# Patient Record
Sex: Female | Born: 1945
Health system: Southern US, Community
[De-identification: ages and names within clinical notes are randomized; demographics above are authoritative.]

## PROBLEM LIST (undated history)

## (undated) DIAGNOSIS — IMO0002 Reserved for concepts with insufficient information to code with codable children: Secondary | ICD-10-CM

## (undated) DIAGNOSIS — O24419 Gestational diabetes mellitus in pregnancy, unspecified control: Secondary | ICD-10-CM

## (undated) DIAGNOSIS — N393 Stress incontinence (female) (male): Secondary | ICD-10-CM

## (undated) DIAGNOSIS — N390 Urinary tract infection, site not specified: Secondary | ICD-10-CM

## (undated) DIAGNOSIS — R0989 Other specified symptoms and signs involving the circulatory and respiratory systems: Secondary | ICD-10-CM

## (undated) DIAGNOSIS — K5790 Diverticulosis of intestine, part unspecified, without perforation or abscess without bleeding: Secondary | ICD-10-CM

## (undated) DIAGNOSIS — H409 Unspecified glaucoma: Secondary | ICD-10-CM

## (undated) DIAGNOSIS — M1990 Unspecified osteoarthritis, unspecified site: Secondary | ICD-10-CM

## (undated) DIAGNOSIS — F32A Depression, unspecified: Secondary | ICD-10-CM

## (undated) DIAGNOSIS — N812 Incomplete uterovaginal prolapse: Secondary | ICD-10-CM

## (undated) DIAGNOSIS — R112 Nausea with vomiting, unspecified: Secondary | ICD-10-CM

## (undated) DIAGNOSIS — N281 Cyst of kidney, acquired: Secondary | ICD-10-CM

## (undated) DIAGNOSIS — N3020 Other chronic cystitis without hematuria: Secondary | ICD-10-CM

## (undated) DIAGNOSIS — F329 Major depressive disorder, single episode, unspecified: Secondary | ICD-10-CM

## (undated) DIAGNOSIS — R9341 Abnormal radiologic findings on diagnostic imaging of renal pelvis, ureter, or bladder: Secondary | ICD-10-CM

## (undated) DIAGNOSIS — Z9889 Other specified postprocedural states: Secondary | ICD-10-CM

## (undated) DIAGNOSIS — E785 Hyperlipidemia, unspecified: Secondary | ICD-10-CM

## (undated) DIAGNOSIS — C4490 Unspecified malignant neoplasm of skin, unspecified: Secondary | ICD-10-CM

## (undated) DIAGNOSIS — F419 Anxiety disorder, unspecified: Secondary | ICD-10-CM

## (undated) DIAGNOSIS — R339 Retention of urine, unspecified: Secondary | ICD-10-CM

## (undated) HISTORY — PX: PUBOVAGINAL SLING: SHX1035

## (undated) HISTORY — DX: Unspecified malignant neoplasm of skin, unspecified: C44.90

## (undated) HISTORY — DX: Depression, unspecified: F32.A

## (undated) HISTORY — PX: OTHER SURGICAL HISTORY: SHX169

## (undated) HISTORY — DX: Cyst of kidney, acquired: N28.1

## (undated) HISTORY — DX: Hyperlipidemia, unspecified: E78.5

## (undated) HISTORY — PX: ABDOMINAL HYSTERECTOMY: SHX81

## (undated) HISTORY — PX: TONSILLECTOMY: SUR1361

## (undated) HISTORY — DX: Retention of urine, unspecified: R33.9

## (undated) HISTORY — DX: Urinary tract infection, site not specified: N39.0

## (undated) HISTORY — PX: BREAST LUMPECTOMY: SHX2

## (undated) HISTORY — DX: Other specified symptoms and signs involving the circulatory and respiratory systems: R09.89

## (undated) HISTORY — PX: VEIN LIGATION AND STRIPPING: SHX2653

## (undated) HISTORY — PX: APPENDECTOMY: SHX54

## (undated) HISTORY — DX: Diverticulosis of intestine, part unspecified, without perforation or abscess without bleeding: K57.90

## (undated) HISTORY — DX: Gestational diabetes mellitus in pregnancy, unspecified control: O24.419

## (undated) HISTORY — DX: Stress incontinence (female) (male): N39.3

## (undated) HISTORY — DX: Reserved for concepts with insufficient information to code with codable children: IMO0002

## (undated) HISTORY — DX: Other chronic cystitis without hematuria: N30.20

## (undated) HISTORY — PX: BREAST BIOPSY: SHX20

## (undated) HISTORY — DX: Incomplete uterovaginal prolapse: N81.2

## (undated) HISTORY — PX: ANTERIOR AND POSTERIOR VAGINAL REPAIR: SUR5

## (undated) HISTORY — DX: Anxiety disorder, unspecified: F41.9

## (undated) HISTORY — DX: Unspecified glaucoma: H40.9

## (undated) HISTORY — DX: Unspecified osteoarthritis, unspecified site: M19.90

## (undated) HISTORY — DX: Major depressive disorder, single episode, unspecified: F32.9

---

## 2000-02-20 ENCOUNTER — Other Ambulatory Visit: Admission: RE | Admit: 2000-02-20 | Discharge: 2000-02-20 | Payer: Self-pay | Admitting: Obstetrics and Gynecology

## 2000-03-13 ENCOUNTER — Encounter: Admission: RE | Admit: 2000-03-13 | Discharge: 2000-06-11 | Payer: Self-pay | Admitting: Obstetrics and Gynecology

## 2003-01-21 ENCOUNTER — Encounter: Payer: Self-pay | Admitting: *Deleted

## 2003-01-21 ENCOUNTER — Encounter: Admission: RE | Admit: 2003-01-21 | Discharge: 2003-01-21 | Payer: Self-pay | Admitting: *Deleted

## 2007-07-31 ENCOUNTER — Ambulatory Visit: Payer: Self-pay | Admitting: Internal Medicine

## 2007-11-16 ENCOUNTER — Ambulatory Visit: Payer: Self-pay | Admitting: Internal Medicine

## 2007-12-20 ENCOUNTER — Ambulatory Visit: Payer: Self-pay | Admitting: Family Medicine

## 2008-06-24 ENCOUNTER — Ambulatory Visit: Payer: Self-pay | Admitting: Family Medicine

## 2008-09-13 ENCOUNTER — Ambulatory Visit: Payer: Self-pay | Admitting: Internal Medicine

## 2008-12-26 ENCOUNTER — Ambulatory Visit: Payer: Self-pay | Admitting: Family Medicine

## 2009-06-10 ENCOUNTER — Ambulatory Visit: Payer: Self-pay | Admitting: Internal Medicine

## 2009-08-25 ENCOUNTER — Ambulatory Visit: Payer: Self-pay | Admitting: Unknown Physician Specialty

## 2009-08-30 ENCOUNTER — Ambulatory Visit: Payer: Self-pay | Admitting: Urology

## 2009-09-27 ENCOUNTER — Inpatient Hospital Stay: Payer: Self-pay | Admitting: Urology

## 2009-11-23 ENCOUNTER — Ambulatory Visit: Payer: Self-pay | Admitting: Family Medicine

## 2010-09-29 ENCOUNTER — Ambulatory Visit: Payer: Self-pay | Admitting: Internal Medicine

## 2011-01-11 ENCOUNTER — Encounter: Payer: Self-pay | Admitting: Internal Medicine

## 2011-01-24 ENCOUNTER — Encounter: Payer: Self-pay | Admitting: Internal Medicine

## 2011-02-22 ENCOUNTER — Encounter: Payer: Self-pay | Admitting: Internal Medicine

## 2011-03-25 ENCOUNTER — Encounter: Payer: Self-pay | Admitting: Internal Medicine

## 2011-09-04 ENCOUNTER — Other Ambulatory Visit: Payer: Self-pay | Admitting: Internal Medicine

## 2011-10-03 LAB — HM MAMMOGRAPHY: HM Mammogram: NORMAL

## 2011-10-08 ENCOUNTER — Other Ambulatory Visit: Payer: Self-pay | Admitting: Internal Medicine

## 2011-10-17 ENCOUNTER — Ambulatory Visit: Payer: Self-pay | Admitting: Internal Medicine

## 2011-10-19 ENCOUNTER — Encounter: Payer: Self-pay | Admitting: Internal Medicine

## 2011-10-19 ENCOUNTER — Telehealth: Payer: Self-pay | Admitting: Internal Medicine

## 2011-10-19 NOTE — Telephone Encounter (Signed)
Mammogram was normal.   

## 2011-10-19 NOTE — Telephone Encounter (Signed)
Sent a letter to patient letting her know her mammogram was normal because her phone had been disconnected. Thanks pam

## 2011-10-20 ENCOUNTER — Other Ambulatory Visit: Payer: Self-pay | Admitting: Internal Medicine

## 2011-10-24 ENCOUNTER — Ambulatory Visit (INDEPENDENT_AMBULATORY_CARE_PROVIDER_SITE_OTHER): Payer: Medicare Other | Admitting: Internal Medicine

## 2011-10-24 ENCOUNTER — Encounter: Payer: Self-pay | Admitting: Internal Medicine

## 2011-10-24 VITALS — BP 124/70 | HR 68 | Temp 98.2°F | Resp 12 | Ht 62.0 in | Wt 158.2 lb

## 2011-10-24 DIAGNOSIS — E785 Hyperlipidemia, unspecified: Secondary | ICD-10-CM

## 2011-10-24 DIAGNOSIS — G4700 Insomnia, unspecified: Secondary | ICD-10-CM | POA: Insufficient documentation

## 2011-10-24 DIAGNOSIS — F411 Generalized anxiety disorder: Secondary | ICD-10-CM | POA: Insufficient documentation

## 2011-10-24 DIAGNOSIS — H6692 Otitis media, unspecified, left ear: Secondary | ICD-10-CM

## 2011-10-24 DIAGNOSIS — F419 Anxiety disorder, unspecified: Secondary | ICD-10-CM

## 2011-10-24 DIAGNOSIS — N959 Unspecified menopausal and perimenopausal disorder: Secondary | ICD-10-CM

## 2011-10-24 DIAGNOSIS — H6690 Otitis media, unspecified, unspecified ear: Secondary | ICD-10-CM

## 2011-10-24 MED ORDER — TRAZODONE HCL 50 MG PO TABS: 50.0000 mg | ORAL_TABLET | Freq: Every day | ORAL | Status: DC

## 2011-10-24 MED ORDER — ESTRADIOL 1 MG PO TABS: 0.5000 mg | ORAL_TABLET | Freq: Every day | ORAL | Status: DC

## 2011-10-24 MED ORDER — AZITHROMYCIN 250 MG PO TABS: ORAL_TABLET | ORAL | Status: AC

## 2011-10-24 NOTE — Progress Notes (Signed)
Subjective:    Patient ID: Michele Meyer, female    DOB: [birth year], 65 y.o.   MRN: [id number]  HPI 65 year old female with a history of anxiety, hypertension, arthritis pain presents for a followup visit. She has several concerns today. First, she notes bilateral ear pain worse on the left than the right which has been persistent over the last 2 months. It has been associated with some nasal drainage and sinus pressure. She denies any fever or chills. She notes decreased hearing on the left. Next  She is also concerned about persistent insomnia. She has been taking alprazolam and can typically sleep for 4 hours but then wakes and is unable to get back to sleep. Her insomnia has been persistent for years. She has not tried other medications for this. She is interested in trying other medications at this time.  She reports that her anxiety is significantly improved recently. She has set some boundaries with her son. She has devoted more time to her marriage.  She is interested in reducing her dose of estrogen supplementation given the potential risk of breast cancer. She tried stopping her Estrace in the past with hot flashes and then resumed its use.  Outpatient Encounter Prescriptions as of 10/24/2011  Medication Sig Dispense Refill  . ALPRAZolam (XANAX) 0.5 MG tablet Take 0.5 mg by mouth at bedtime as needed.        Marland Kitchen BYSTOLIC 5 MG tablet TAKE 1 TABLET BY MOUTH EVERYDAY  30 tablet  0  . escitalopram (LEXAPRO) 20 MG tablet Take 20 mg by mouth daily.        Marland Kitchen estradiol (ESTRACE) 1 MG tablet Take 0.5 tablets (0.5 mg total) by mouth daily.  30 tablet  1  . lisinopril-hydrochlorothiazide (PRINZIDE,ZESTORETIC) 10-12.5 MG per tablet Take 1 tablet by mouth daily.        . MELOXICAM PO Take 1 tablet by mouth as needed.        . vitamin E 400 UNIT capsule Take 400 Units by mouth daily.          Review of Systems  Constitutional: Negative for fever, chills, appetite change, fatigue and unexpected  weight change.  HENT: Negative for ear pain, congestion, sore throat, trouble swallowing, neck pain, voice change and sinus pressure.   Eyes: Negative for visual disturbance.  Respiratory: Negative for cough, shortness of breath, wheezing and stridor.   Cardiovascular: Negative for chest pain, palpitations and leg swelling.  Gastrointestinal: Negative for nausea, vomiting, abdominal pain, diarrhea, constipation, blood in stool, abdominal distention and anal bleeding.  Genitourinary: Negative for dysuria and flank pain.  Musculoskeletal: Negative for myalgias, arthralgias and gait problem.  Skin: Negative for color change and rash.  Neurological: Negative for dizziness and headaches.  Hematological: Negative for adenopathy. Does not bruise/bleed easily.  Psychiatric/Behavioral: Negative for suicidal ideas, sleep disturbance and dysphoric mood. The patient is not nervous/anxious.    BP 124/70  Pulse 68  Temp(Src) 98.2 F (36.8 C) (Oral)  Resp 12  Ht 5\' 2"  (1.575 m)  Wt 158 lb 4 oz (71.782 kg)  BMI 28.94 kg/m2  SpO2 99%     Objective:   Physical Exam  Constitutional: She is oriented to person, place, and time. She appears well-developed and well-nourished. No distress.  HENT:  Head: Normocephalic and atraumatic.  Right Ear: Tympanic membrane, external ear and ear canal normal.  Left Ear: External ear and ear canal normal. Tympanic membrane is injected. A middle ear effusion is present.  Nose: Nose normal.  Mouth/Throat: Oropharynx is clear and moist. No oropharyngeal exudate.  Eyes: Conjunctivae are normal. Pupils are equal, round, and reactive to light. Right eye exhibits no discharge. Left eye exhibits no discharge. No scleral icterus.  Neck: Normal range of motion. Neck supple. No tracheal deviation present. No thyromegaly present.  Cardiovascular: Normal rate, regular rhythm, normal heart sounds and intact distal pulses.  Exam reveals no gallop and no friction rub.   No murmur  heard. Pulmonary/Chest: Effort normal and breath sounds normal. No respiratory distress. She has no wheezes. She has no rales. She exhibits no tenderness.  Musculoskeletal: Normal range of motion. She exhibits no edema and no tenderness.  Lymphadenopathy:    She has no cervical adenopathy.  Neurological: She is alert and oriented to person, place, and time. No cranial nerve deficit. She exhibits normal muscle tone. Coordination normal.  Skin: Skin is warm and dry. No rash noted. She is not diaphoretic. No erythema. No pallor.  Psychiatric: She has a normal mood and affect. Her behavior is normal. Judgment and thought content normal.          Assessment & Plan:  1. Otitis media left ear - symptoms and exam are consistent with otitis media of the left ear. Will treat with azithromycin. She will call or return to clinic if symptoms are not improving. She will followup in one month.  2. Insomnia -insomnia is improved with Xanax but she is only getting approximately 4 hours of sleep per night. We'll try adding trazodone 50 mg at bedtime. After 2 or 3 days if she has no improvement she will increase dose to 100 mg. She will followup in one month to assess progress.  3. Anxiety -anxiety is markedly improved after patient set boundaries with her son. Encouraged her to continue taking time for herself and her marriage. She will continue her current medications including Lexapro and Xanax. She will followup in one month.  4. Hot flashes -patient takes Estrace to help with hot flashes after menopause. Given her concerns about the potential side effects of Estrace, we'll try decreasing the dose to 0.5 mg daily. She will use this for one month then we will decrease to every other day. She will followup in one month.  5. Health Maintenance - Will schedule wellness visit. Will plan for Flu shot at next visit.

## 2011-10-24 NOTE — Patient Instructions (Addendum)
Labs tomorrow. Start z-pack today. Call if symptoms not improving by Friday. Start Trazodone today. If no improvement in sleep, then advance to 100mg  at bedtime after 2-3 days.

## 2011-10-25 ENCOUNTER — Other Ambulatory Visit: Payer: Self-pay | Admitting: Internal Medicine

## 2011-10-25 ENCOUNTER — Other Ambulatory Visit (INDEPENDENT_AMBULATORY_CARE_PROVIDER_SITE_OTHER): Payer: Medicare Other | Admitting: *Deleted

## 2011-10-25 ENCOUNTER — Encounter: Payer: Self-pay | Admitting: Internal Medicine

## 2011-10-25 DIAGNOSIS — Z0000 Encounter for general adult medical examination without abnormal findings: Secondary | ICD-10-CM

## 2011-10-25 DIAGNOSIS — E785 Hyperlipidemia, unspecified: Secondary | ICD-10-CM

## 2011-10-25 LAB — CBC WITH DIFFERENTIAL/PLATELET
Basophils Absolute: 0 10*3/uL (ref 0.0–0.1)
Eosinophils Absolute: 0.5 10*3/uL (ref 0.0–0.7)
Hemoglobin: 12.9 g/dL (ref 12.0–15.0)
Lymphocytes Relative: 24.6 % (ref 12.0–46.0)
Lymphs Abs: 1.6 10*3/uL (ref 0.7–4.0)
MCHC: 33.9 g/dL (ref 30.0–36.0)
Neutro Abs: 3.9 10*3/uL (ref 1.4–7.7)
Platelets: 221 10*3/uL (ref 150.0–400.0)
RDW: 13.1 % (ref 11.5–14.6)

## 2011-10-25 LAB — COMPREHENSIVE METABOLIC PANEL
BUN: 15 mg/dL (ref 6–23)
CO2: 29 mEq/L (ref 19–32)
Creatinine, Ser: 0.7 mg/dL (ref 0.4–1.2)
GFR: 85.89 mL/min (ref >60.00)
Glucose, Bld: 97 mg/dL (ref 70–99)
Sodium: 139 mEq/L (ref 135–145)
Total Bilirubin: 0.5 mg/dL (ref 0.3–1.2)
Total Protein: 7.3 g/dL (ref 6.0–8.3)

## 2011-10-25 LAB — LIPID PANEL
Cholesterol: 230 mg/dL — ABNORMAL HIGH (ref 0–200)
Triglycerides: 126 mg/dL (ref 0.0–149.0)
VLDL: 25.2 mg/dL (ref 0.0–40.0)

## 2011-11-05 ENCOUNTER — Ambulatory Visit (INDEPENDENT_AMBULATORY_CARE_PROVIDER_SITE_OTHER): Payer: Medicare Other | Admitting: Internal Medicine

## 2011-11-05 ENCOUNTER — Encounter: Payer: Self-pay | Admitting: Internal Medicine

## 2011-11-05 VITALS — BP 130/64 | HR 61 | Temp 98.4°F | Ht 65.0 in | Wt 158.0 lb

## 2011-11-05 DIAGNOSIS — F419 Anxiety disorder, unspecified: Secondary | ICD-10-CM

## 2011-11-05 DIAGNOSIS — E785 Hyperlipidemia, unspecified: Secondary | ICD-10-CM

## 2011-11-05 DIAGNOSIS — F411 Generalized anxiety disorder: Secondary | ICD-10-CM

## 2011-11-05 DIAGNOSIS — Z0000 Encounter for general adult medical examination without abnormal findings: Secondary | ICD-10-CM

## 2011-11-05 DIAGNOSIS — R079 Chest pain, unspecified: Secondary | ICD-10-CM

## 2011-11-05 NOTE — Patient Instructions (Signed)
Chest xray tomorrow. Follow up in 42month.

## 2011-11-05 NOTE — Progress Notes (Signed)
Subjective:    Patient ID: Michele Meyer, female    DOB: [birth year], 65 y.o.   MRN: [id number]  HPI  The patient is here for annual Medicare wellness examination and management of other chronic and acute problems.   The risk factors are reflected in the social history.  The roster of all physicians providing medical care to patient - is listed in the Snapshot section of the chart.  Activities of daily living:  The patient is 100% inedpendent in all ADLs: dressing, toileting, feeding as well as independent mobility  Home safety : The patient has smoke detectors in the home. They wear seatbelts. Firearms are present in the home, kept in a safe fashion. There is no violence in the home.   There is no risks for hepatitis, STDs or HIV. There is no history of blood transfusion. They have no travel history to infectious disease endemic areas of the world.  The patient has seen their dentist in the last six months. They have seen their eye doctor in the last year. They deny hearing difficulty and have not had audiologic testing in the last year.  They do not have excessive sun exposure. Discussed the need for sun protection: hats, long sleeves and use of sunscreen if there is significant sun exposure. Pt was seen by dermatologist September 2012.  Diet: the importance of a healthy diet is discussed. They do have an unhealthy diet with intake of high-fat, high-sugar foods recently.  The patient has a regular exercise program: not at this time, was swimming daily. The benefits of regular aerobic exercise were discussed.  Depression screen: pt is tearful and having difficulty sleeping because of ongoing anxiety/depression related to issues with her son.  Cognitive assessment: the patient manages all their financial and personal affairs and is actively engaged. They could relate day,date,year and events; recalled 3/3 objects at 3 minutes; performed clock-face test normally.  The following portions of  the patient's history were reviewed and updated as appropriate: allergies, current medications, past family history, past medical history,  past surgical history, past social history  and problem list.  Vision, hearing, body mass index were assessed and reviewed.   During the course of the visit the patient was educated and counseled about appropriate screening and preventive services including : fall prevention , diabetes screening, nutrition counseling, colorectal cancer screening, and recommended immunizations.   Outpatient Encounter Prescriptions as of 11/05/2011  Medication Sig Dispense Refill  . ALPRAZolam (XANAX) 0.5 MG tablet Take 0.5 mg by mouth at bedtime as needed.        Marland Kitchen BYSTOLIC 5 MG tablet TAKE 1 TABLET BY MOUTH EVERYDAY  30 tablet  0  . escitalopram (LEXAPRO) 10 MG tablet TAKE 1 TABLET BY MOUTH DAILY  30 tablet  1  . estradiol (ESTRACE) 1 MG tablet Take 0.5 tablets (0.5 mg total) by mouth daily.  30 tablet  1  . lisinopril-hydrochlorothiazide (PRINZIDE,ZESTORETIC) 10-12.5 MG per tablet TAKE 1 TABLET BY MOUTH EVERY DAY  90 tablet  3  . MELOXICAM PO Take 1 tablet by mouth as needed.       . traZODone (DESYREL) 50 MG tablet Take 1 tablet (50 mg total) by mouth at bedtime.  30 tablet  3  . vitamin E 400 UNIT capsule Take 400 Units by mouth daily.          Review of Systems  Constitutional: Negative for fever, chills, appetite change, fatigue and unexpected weight change.  HENT: Negative for ear pain, congestion,  sore throat, neck pain and sinus pressure.   Eyes: Negative for visual disturbance.  Respiratory: Negative for cough, shortness of breath, wheezing and stridor.   Cardiovascular: Positive for chest pain (left lower chest wall). Negative for palpitations and leg swelling.  Gastrointestinal: Negative for nausea, vomiting, abdominal pain, diarrhea, constipation, blood in stool, abdominal distention and anal bleeding.  Genitourinary: Negative for dysuria and flank pain.    Musculoskeletal: Negative for myalgias, arthralgias and gait problem.  Skin: Negative for color change and rash.  Neurological: Negative for dizziness and headaches.  Hematological: Negative for adenopathy. Does not bruise/bleed easily.  Psychiatric/Behavioral: Positive for sleep disturbance and dysphoric mood. Negative for suicidal ideas. The patient is nervous/anxious.    BP 130/64  Pulse 61  Temp(Src) 98.4 F (36.9 C) (Oral)  Wt 158 lb (71.668 kg)  SpO2 97%     Objective:   Physical Exam  Constitutional: She is oriented to person, place, and time. She appears well-developed and well-nourished. No distress.  HENT:  Head: Normocephalic and atraumatic.  Right Ear: External ear normal.  Left Ear: External ear normal.  Nose: Nose normal.  Mouth/Throat: Oropharynx is clear and moist. No oropharyngeal exudate.  Eyes: Conjunctivae are normal. Pupils are equal, round, and reactive to light. Right eye exhibits no discharge. Left eye exhibits no discharge. No scleral icterus.  Neck: Normal range of motion. Neck supple. No tracheal deviation present. No thyromegaly present.  Cardiovascular: Normal rate, regular rhythm, normal heart sounds and intact distal pulses.  Exam reveals no gallop and no friction rub.   No murmur heard. Pulmonary/Chest: Effort normal and breath sounds normal. No respiratory distress. She has no wheezes. She has no rales. She exhibits no tenderness. Right breast exhibits no inverted nipple, no mass, no nipple discharge, no skin change and no tenderness. Left breast exhibits no inverted nipple, no mass, no nipple discharge, no skin change and no tenderness.    Abdominal: Soft. She exhibits no distension and no mass. There is no tenderness. There is no rebound and no guarding.  Musculoskeletal: Normal range of motion. She exhibits no edema and no tenderness.  Lymphadenopathy:    She has no cervical adenopathy.  Neurological: She is alert and oriented to person, place,  and time. No cranial nerve deficit. She exhibits normal muscle tone. Coordination normal.  Skin: Skin is warm and dry. Rash noted. Rash is papular. She is not diaphoretic. No erythema. No pallor.       Numerous light brown, raised, plaques c/w SK over trunk  Psychiatric: She has a normal mood and affect. Her behavior is normal. Judgment and thought content normal.          Assessment & Plan:  1. General exam - Exam including breast exam normal today. PAP deferred as pt s/p hysterectomy and >65YO with no h/o abnormal PAP.  Labs including cholesterol reviewed today.  Flu vaccine today.  2. Anxiety and Depression - Related to ongoing issues with son today. Encouraged her to seek counsel from Dr. Letta Moynahan, her psychologist, as to best way to construct intervention for her son. Continue lexapro and alprazolam. Follow up 17month.  3.Hyperlipidemia - Reviewed goal LDL<100.  Discussed dietary changes including reduced saturated fat and increased fiber. Repeat lipids 12/2011.  4. Chest wall pain - Suspect muscular strain, however given persistence, will get CXR.

## 2011-11-06 ENCOUNTER — Ambulatory Visit (INDEPENDENT_AMBULATORY_CARE_PROVIDER_SITE_OTHER)
Admission: RE | Admit: 2011-11-06 | Discharge: 2011-11-06 | Disposition: A | Discharge status: Home / Self Care | Payer: Medicare Other | Source: Ambulatory Visit | Attending: Internal Medicine | Admitting: Internal Medicine

## 2011-11-06 DIAGNOSIS — R079 Chest pain, unspecified: Secondary | ICD-10-CM

## 2011-11-12 ENCOUNTER — Encounter: Payer: Self-pay | Admitting: Internal Medicine

## 2011-11-19 ENCOUNTER — Other Ambulatory Visit: Payer: Self-pay | Admitting: Internal Medicine

## 2011-11-28 ENCOUNTER — Other Ambulatory Visit: Payer: Self-pay | Admitting: Internal Medicine

## 2011-12-05 ENCOUNTER — Encounter: Payer: Self-pay | Admitting: Internal Medicine

## 2011-12-05 ENCOUNTER — Ambulatory Visit (INDEPENDENT_AMBULATORY_CARE_PROVIDER_SITE_OTHER): Payer: Medicare Other | Admitting: Internal Medicine

## 2011-12-05 VITALS — BP 138/60 | HR 60 | Temp 98.3°F | Wt 154.0 lb

## 2011-12-05 DIAGNOSIS — J40 Bronchitis, not specified as acute or chronic: Secondary | ICD-10-CM

## 2011-12-05 MED ORDER — DOXYCYCLINE HYCLATE 100 MG PO CAPS: 100.0000 mg | ORAL_CAPSULE | Freq: Two times a day (BID) | ORAL | Status: AC

## 2011-12-05 MED ORDER — PREDNISONE (PAK) 10 MG PO TABS: ORAL_TABLET | ORAL | Status: AC

## 2011-12-05 NOTE — Progress Notes (Signed)
Subjective:    Patient ID: Michele Meyer, female    DOB: 1946/11/08, 65 y.o.   MRN: [id number]  HPI 65 year old female with a history of anxiety presents for followup. In regards to her anxiety, she reports that things are slightly improved with her family situation. She reports that her son has been drug free for 4 days. She notes that she has been coping better with her family interactions. She continues to take Lexapro with improvement in her symptoms. She also use alprazolam as needed for severe anxiety.  She is concerned today about an almost two-month history of cough, nasal congestion, and bilateral ear pain. She was seen here approximately 2 months ago and treated with azithromycin with no improvement. She then went to urgent care where they repeated a course of azithromycin again with no improvement. She notes cough which is productive of purulent sputum, shortness of breath, nasal congestion, and bilateral ear pain with a decrease in hearing. She has been using over-the-counter cough and cold medicines with no improvement. She denies any recent fever or chills. She does note significant fatigue over the last 2 months.  Outpatient Encounter Prescriptions as of 12/05/2011  Medication Sig Dispense Refill  . ALPRAZolam (XANAX) 0.5 MG tablet Take 0.5 mg by mouth at bedtime as needed.        Marland Kitchen BYSTOLIC 5 MG tablet TAKE 1 TABLET BY MOUTH EVERYDAY  90 tablet  2  . escitalopram (LEXAPRO) 10 MG tablet TAKE 1 TABLET BY MOUTH DAILY  30 tablet  1  . estradiol (ESTRACE) 1 MG tablet Take 0.5 tablets (0.5 mg total) by mouth daily.  30 tablet  1  . lisinopril-hydrochlorothiazide (PRINZIDE,ZESTORETIC) 10-12.5 MG per tablet TAKE 1 TABLET BY MOUTH EVERY DAY  90 tablet  3  . meloxicam (MOBIC) 15 MG tablet TAKE 1 TABLET BY MOUTH EVERY DAY  30 tablet  3    Review of Systems  Constitutional: Positive for fatigue. Negative for fever, chills and diaphoresis.  HENT: Positive for hearing loss, ear pain,  congestion, rhinorrhea, postnasal drip and sinus pressure.   Respiratory: Positive for cough and shortness of breath. Negative for wheezing.   Cardiovascular: Negative for chest pain, palpitations and leg swelling.  Psychiatric/Behavioral: The patient is nervous/anxious.    BP 138/60  Pulse 60  Temp(Src) 98.3 F (36.8 C) (Oral)  Wt 154 lb (69.854 kg)  SpO2 97%     Objective:   Physical Exam  Constitutional: She is oriented to person, place, and time. She appears well-developed and well-nourished. No distress.  HENT:  Head: Normocephalic and atraumatic.  Right Ear: External ear normal. Tympanic membrane is not erythematous. A middle ear effusion is present.  Left Ear: External ear normal. Tympanic membrane is erythematous. A middle ear effusion is present.  Nose: Nose normal.  Mouth/Throat: Oropharynx is clear and moist. No oropharyngeal exudate.  Eyes: Conjunctivae are normal. Pupils are equal, round, and reactive to light. Right eye exhibits no discharge. Left eye exhibits no discharge. No scleral icterus.  Neck: Normal range of motion. Neck supple. No tracheal deviation present. No thyromegaly present.  Cardiovascular: Normal rate, regular rhythm, normal heart sounds and intact distal pulses.  Exam reveals no gallop and no friction rub.   No murmur heard. Pulmonary/Chest: Effort normal. No respiratory distress. She has decreased breath sounds (prolonged expiration). She has no wheezes. She has no rhonchi. She has no rales. She exhibits no tenderness.  Musculoskeletal: Normal range of motion. She exhibits no edema and no tenderness.  Lymphadenopathy:    She has no cervical adenopathy.  Neurological: She is alert and oriented to person, place, and time. No cranial nerve deficit. She exhibits normal muscle tone. Coordination normal.  Skin: Skin is warm and dry. No rash noted. She is not diaphoretic. No erythema. No pallor.  Psychiatric: She has a normal mood and affect. Her behavior is  normal. Judgment and thought content normal.          Assessment & Plan:  1. Bronchitis - symptoms and exam are most consistent with bronchitis as well as a left otitis media. We discussed broadening her antibiotic coverage in adding doxycycline for better strep coverage. She will take doxycycline twice daily for 2 weeks. We discussed the risk of GI ear dictation with this medication. We also discussed the risk of sunburn with sun exposure while taking this medicine. She will also take a prednisone taper pack. We discussed the potential side effects of prednisone. She will followup in 4 weeks or sooner if no improvement. If no improvement in her symptoms, would favor getting chest x-ray in setting up referral to ENT.  2. Anxiety -currently well controlled on Lexapro and alprazolam. We'll continue to monitor.

## 2011-12-10 ENCOUNTER — Telehealth: Payer: Self-pay | Admitting: Internal Medicine

## 2011-12-10 NOTE — Telephone Encounter (Signed)
Left mess to call office back.   

## 2011-12-10 NOTE — Telephone Encounter (Signed)
[phone]  Pt called she had to stop taking the presdizone after 4 pills it was giving pt headaches and making her sick on stomach.  She has already taken 2 zpak and is on  Doxycycline hycolate 100mg  2 tab daily.  Pt stated still not feelingwell.  Her ear stopped with fluid.  Pt grandson was just diagonized with strain A flu.  Pt stated she had chills yesterday.    [facility]

## 2011-12-12 NOTE — Telephone Encounter (Signed)
Left mess to call office back.   

## 2011-12-13 NOTE — Telephone Encounter (Signed)
Spoke w/pt, scheduled for OV tomorrow - she is c/o sharp ear pains

## 2011-12-14 ENCOUNTER — Ambulatory Visit (INDEPENDENT_AMBULATORY_CARE_PROVIDER_SITE_OTHER): Payer: Medicare Other | Admitting: Internal Medicine

## 2011-12-14 ENCOUNTER — Encounter: Payer: Self-pay | Admitting: Internal Medicine

## 2011-12-14 VITALS — BP 138/60 | HR 59 | Temp 98.5°F | Wt 157.0 lb

## 2011-12-14 DIAGNOSIS — H6690 Otitis media, unspecified, unspecified ear: Secondary | ICD-10-CM

## 2011-12-14 DIAGNOSIS — F419 Anxiety disorder, unspecified: Secondary | ICD-10-CM

## 2011-12-14 DIAGNOSIS — R5383 Other fatigue: Secondary | ICD-10-CM

## 2011-12-14 DIAGNOSIS — B379 Candidiasis, unspecified: Secondary | ICD-10-CM

## 2011-12-14 DIAGNOSIS — F411 Generalized anxiety disorder: Secondary | ICD-10-CM

## 2011-12-14 MED ORDER — SULFAMETHOXAZOLE-TMP DS 800-160 MG PO TABS: 1.0000 | ORAL_TABLET | Freq: Two times a day (BID) | ORAL | Status: AC

## 2011-12-14 MED ORDER — ANTIPYRINE-BENZOCAINE 5.4-1.4 % OT SOLN: 3.0000 [drp] | OTIC | Status: AC | PRN

## 2011-12-14 MED ORDER — NYSTATIN 100000 UNIT/GM EX POWD: CUTANEOUS | Status: DC

## 2011-12-14 MED ORDER — ALPRAZOLAM 0.5 MG PO TABS: 0.5000 mg | ORAL_TABLET | Freq: Every evening | ORAL | Status: DC | PRN

## 2011-12-15 ENCOUNTER — Encounter: Payer: Self-pay | Admitting: Internal Medicine

## 2011-12-15 NOTE — Progress Notes (Signed)
Subjective:    Patient ID: Michele Meyer, female    DOB: Jan 10, 1946, 65 y.o.   MRN: [id number]  HPI 65 year old female presents for an acute visit complaining of persistent pain in both ears. She was treated with doxycycline last week for bilateral otitis media. She reports some improvement with this but then symptoms began to recur. She denies any fever or chills. She reports some mild nasal congestion. She denies any shortness of breath or cough. She notes ongoing fatigue. This has been persistent for about one month. She denies any weight loss, night sweats, swollen lymph nodes, change in bowel habits. She does have ongoing issues with anxiety which are exacerbated around the holidays secondary to some family issues.  Outpatient Encounter Prescriptions as of 12/14/2011  Medication Sig Dispense Refill  . ALPRAZolam (XANAX) 0.5 MG tablet Take 1 tablet (0.5 mg total) by mouth at bedtime as needed.  30 tablet  3  . BYSTOLIC 5 MG tablet TAKE 1 TABLET BY MOUTH EVERYDAY  90 tablet  2  . doxycycline (VIBRAMYCIN) 100 MG capsule Take 1 capsule (100 mg total) by mouth 2 (two) times daily.  28 capsule  0  . escitalopram (LEXAPRO) 10 MG tablet TAKE 1 TABLET BY MOUTH DAILY  30 tablet  1  . estradiol (ESTRACE) 1 MG tablet Take 0.5 tablets (0.5 mg total) by mouth daily.  30 tablet  1  . lisinopril-hydrochlorothiazide (PRINZIDE,ZESTORETIC) 10-12.5 MG per tablet TAKE 1 TABLET BY MOUTH EVERY DAY  90 tablet  3  . meloxicam (MOBIC) 15 MG tablet TAKE 1 TABLET BY MOUTH EVERY DAY  30 tablet  3  . vitamin E 400 UNIT capsule Take 400 Units by mouth daily.        Marland Kitchen DISCONTD: ALPRAZolam (XANAX) 0.5 MG tablet Take 0.5 mg by mouth at bedtime as needed.        Marland Kitchen antipyrine-benzocaine (AURALGAN) otic solution Place 3 drops into both ears every 2 (two) hours as needed for pain.  10 mL  0  . nystatin (MYCOSTATIN) powder Apply to affected area 3 times daily  15 g  3    Review of Systems  Constitutional: Positive for fatigue.  Negative for fever, chills and unexpected weight change.  HENT: Positive for ear pain and congestion. Negative for hearing loss, nosebleeds, sore throat, facial swelling, rhinorrhea, sneezing, mouth sores, trouble swallowing, neck pain, neck stiffness, voice change, postnasal drip, sinus pressure, tinnitus and ear discharge.   Eyes: Negative for pain, discharge, redness and visual disturbance.  Respiratory: Negative for cough, chest tightness, shortness of breath, wheezing and stridor.   Cardiovascular: Negative for chest pain, palpitations and leg swelling.  Musculoskeletal: Negative for myalgias and arthralgias.  Skin: Negative for color change and rash.  Neurological: Negative for dizziness, weakness, light-headedness and headaches.  Hematological: Negative for adenopathy.  Psychiatric/Behavioral: The patient is nervous/anxious.    BP 138/60  Pulse 59  Temp(Src) 98.5 F (36.9 C) (Oral)  Wt 157 lb (71.215 kg)  SpO2 98%     Objective:   Physical Exam  Constitutional: She is oriented to person, place, and time. She appears well-developed and well-nourished. No distress.  HENT:  Head: Normocephalic and atraumatic.  Right Ear: External ear normal. Tympanic membrane is bulging. A middle ear effusion is present.  Left Ear: External ear normal. Tympanic membrane is bulging. A middle ear effusion is present.  Nose: Nose normal.  Mouth/Throat: Oropharynx is clear and moist. No oropharyngeal exudate.  Eyes: Conjunctivae are normal. Pupils are equal,  round, and reactive to light. Right eye exhibits no discharge. Left eye exhibits no discharge. No scleral icterus.  Neck: Normal range of motion. Neck supple. No tracheal deviation present. No thyromegaly present.  Cardiovascular: Normal rate, regular rhythm, normal heart sounds and intact distal pulses.  Exam reveals no gallop and no friction rub.   No murmur heard. Pulmonary/Chest: Effort normal and breath sounds normal. No respiratory distress.  She has no wheezes. She has no rales. She exhibits no tenderness.  Musculoskeletal: Normal range of motion. She exhibits no edema and no tenderness.  Lymphadenopathy:    She has no cervical adenopathy.  Neurological: She is alert and oriented to person, place, and time. No cranial nerve deficit. She exhibits normal muscle tone. Coordination normal.  Skin: Skin is warm and dry. No rash noted. She is not diaphoretic. No erythema. No pallor.  Psychiatric: She has a normal mood and affect. Her behavior is normal. Judgment and thought content normal.          Assessment & Plan:  1. Otitis media - Bilateral. Some improvement with Doxycycline, but not complete resolution. Will change to Bactrim as doxycyline causing some upset stomach. If no improvement over next 72hr, then will set up referral to ENT for evaluation.  2. Fatigue - Suspect secondary to recent illness and ongoing anxiety.  Will continue to monitor. Labwork has been normal. If no improvement after acute illness resolves, then consider sleep study.

## 2011-12-19 ENCOUNTER — Telehealth: Payer: Self-pay | Admitting: Internal Medicine

## 2011-12-20 ENCOUNTER — Telehealth: Payer: Self-pay | Admitting: *Deleted

## 2011-12-20 NOTE — Telephone Encounter (Signed)
Xanax shows printed at last OV, per pt she does not have RX. I called in to pharm, OK per md.

## 2012-01-04 ENCOUNTER — Ambulatory Visit: Payer: Medicare Other | Admitting: Internal Medicine

## 2012-01-04 ENCOUNTER — Telehealth: Payer: Self-pay | Admitting: Internal Medicine

## 2012-01-04 NOTE — Telephone Encounter (Signed)
Pt called had emergency come up with family.  But she can come in later in the afternoon  After 2 She has jabbing pain 2 or 3 times a day

## 2012-01-04 NOTE — Telephone Encounter (Signed)
We can see her next week. If her ear pain is persistent, then she should be seen by ENT.

## 2012-01-04 NOTE — Telephone Encounter (Signed)
Do you want to see pt this PM? Or next week. She continues to have ear pain off and on.

## 2012-01-04 NOTE — Telephone Encounter (Signed)
Patient notified. Appt scheduled next week.

## 2012-01-09 ENCOUNTER — Other Ambulatory Visit: Payer: Self-pay | Admitting: *Deleted

## 2012-01-09 MED ORDER — ESCITALOPRAM OXALATE 10 MG PO TABS: 10.0000 mg | ORAL_TABLET | Freq: Every day | ORAL | Status: DC

## 2012-01-10 ENCOUNTER — Ambulatory Visit (INDEPENDENT_AMBULATORY_CARE_PROVIDER_SITE_OTHER): Payer: Medicare Other | Admitting: Internal Medicine

## 2012-01-10 ENCOUNTER — Encounter: Payer: Self-pay | Admitting: Internal Medicine

## 2012-01-10 VITALS — BP 152/72 | HR 62 | Temp 98.1°F | Ht 62.5 in | Wt 157.0 lb

## 2012-01-10 DIAGNOSIS — H9209 Otalgia, unspecified ear: Secondary | ICD-10-CM

## 2012-01-10 DIAGNOSIS — F411 Generalized anxiety disorder: Secondary | ICD-10-CM

## 2012-01-10 DIAGNOSIS — F419 Anxiety disorder, unspecified: Secondary | ICD-10-CM

## 2012-01-10 DIAGNOSIS — M1990 Unspecified osteoarthritis, unspecified site: Secondary | ICD-10-CM | POA: Insufficient documentation

## 2012-01-10 DIAGNOSIS — F329 Major depressive disorder, single episode, unspecified: Secondary | ICD-10-CM

## 2012-01-10 DIAGNOSIS — H9202 Otalgia, left ear: Secondary | ICD-10-CM

## 2012-01-10 MED ORDER — ALPRAZOLAM 0.5 MG PO TABS: 0.5000 mg | ORAL_TABLET | Freq: Every evening | ORAL | Status: DC | PRN

## 2012-01-10 NOTE — Assessment & Plan Note (Signed)
Patient has had recurrent episodes of otitis media in her left ear. Her exam today is relatively normal with the exception of minimal middle ear effusion. Given her persistent pain, we'll set her up with ENT for further evaluation.

## 2012-01-10 NOTE — Assessment & Plan Note (Signed)
Present in her bilateral knees. She did get some benefit from physical therapy in the past. We'll plan to repeat a course of physical therapy. She will continue her meloxicam. She will followup here in one month.

## 2012-01-10 NOTE — Progress Notes (Signed)
Subjective:    Patient ID: Michele Meyer, female    DOB: 1946/02/03, 66 y.o.   MRN: [id number]  HPI 66 year old female with a history of depression, anxiety, hypertension, and osteoarthritis presents for followup. Her primary concern today is ongoing anxiety and depression related to dealing with her son. She reports that her son's mental illness has put a significant strain on her marriage and her personal life. She notes that she is spending most of her day consumed by caring for him. She reports that her son will not seek help for himself. She notes that this has been significant stress on both her and her husband. She is tearful today. She has sought counseling from a local psychologist and would like to reestablish to help develop coping strategies. She is not interested in making changes in her antidepressant medications today. She reports some improvement with Lexapro and Xanax.  She is also concerned today about persistent left ear pain. She has been treated with several courses of antibiotics for recurrent ear infections in her left ear. She notes persistent pain in the left ear. She denies any change in hearing. She denies any drainage from the ear. She denies any fever or chills.  She is also concerned today about ongoing bilateral knee pain. This has been going on for years. She has had physical therapy in the past with some improvement. She is interested in repeating a course of physical therapy to see if she can gain some additional benefit. She is currently using meloxicam with minimal improvement in her symptoms.  Outpatient Encounter Prescriptions as of 01/10/2012  Medication Sig Dispense Refill  . ALPRAZolam (XANAX) 0.5 MG tablet Take 1 tablet (0.5 mg total) by mouth at bedtime as needed.  90 tablet  1  . BYSTOLIC 5 MG tablet TAKE 1 TABLET BY MOUTH EVERYDAY  90 tablet  2  . escitalopram (LEXAPRO) 10 MG tablet Take 1 tablet (10 mg total) by mouth daily.  90 tablet  1  . estradiol  (ESTRACE) 1 MG tablet Take 0.5 tablets (0.5 mg total) by mouth daily.  30 tablet  1  . lisinopril-hydrochlorothiazide (PRINZIDE,ZESTORETIC) 10-12.5 MG per tablet TAKE 1 TABLET BY MOUTH EVERY DAY  90 tablet  3  . meloxicam (MOBIC) 15 MG tablet TAKE 1 TABLET BY MOUTH EVERY DAY  30 tablet  3  . nystatin (MYCOSTATIN) powder Apply to affected area 3 times daily  15 g  3  . vitamin E 400 UNIT capsule Take 400 Units by mouth daily.        Marland Kitchen DISCONTD: ALPRAZolam (XANAX) 0.5 MG tablet Take 1 tablet (0.5 mg total) by mouth at bedtime as needed.  30 tablet  3    Review of Systems  Constitutional: Negative for fever, chills, appetite change, fatigue and unexpected weight change.  HENT: Negative for ear pain, congestion, sore throat, trouble swallowing, neck pain, voice change and sinus pressure.   Eyes: Negative for visual disturbance.  Respiratory: Negative for cough, shortness of breath, wheezing and stridor.   Cardiovascular: Negative for chest pain, palpitations and leg swelling.  Gastrointestinal: Negative for nausea, vomiting, abdominal pain, diarrhea, constipation, blood in stool, abdominal distention and anal bleeding.  Genitourinary: Negative for dysuria and flank pain.  Musculoskeletal: Positive for arthralgias (knees). Negative for myalgias and gait problem.  Skin: Negative for color change and rash.  Neurological: Negative for dizziness and headaches.  Hematological: Negative for adenopathy. Does not bruise/bleed easily.  Psychiatric/Behavioral: Positive for dysphoric mood. Negative for suicidal ideas  and sleep disturbance. The patient is nervous/anxious.    BP 152/72  Pulse 62  Temp(Src) 98.1 F (36.7 C) (Oral)  Ht 5' 2.5" (1.588 m)  Wt 157 lb (71.215 kg)  BMI 28.26 kg/m2  SpO2 98%     Objective:   Physical Exam  Constitutional: She is oriented to person, place, and time. She appears well-developed and well-nourished. No distress.  HENT:  Head: Normocephalic and atraumatic.    Right Ear: External ear normal.  Left Ear: External ear normal.  Nose: Nose normal.  Mouth/Throat: Oropharynx is clear and moist. No oropharyngeal exudate.  Eyes: Conjunctivae are normal. Pupils are equal, round, and reactive to light. Right eye exhibits no discharge. Left eye exhibits no discharge. No scleral icterus.  Neck: Normal range of motion. Neck supple. No tracheal deviation present. No thyromegaly present.  Cardiovascular: Normal rate, regular rhythm, normal heart sounds and intact distal pulses.  Exam reveals no gallop and no friction rub.   No murmur heard. Pulmonary/Chest: Effort normal and breath sounds normal. No respiratory distress. She has no wheezes. She has no rales. She exhibits no tenderness.  Musculoskeletal: Normal range of motion. She exhibits no edema and no tenderness.  Lymphadenopathy:    She has no cervical adenopathy.  Neurological: She is alert and oriented to person, place, and time. No cranial nerve deficit. She exhibits normal muscle tone. Coordination normal.  Skin: Skin is warm and dry. No rash noted. She is not diaphoretic. No erythema. No pallor.  Psychiatric: Her behavior is normal. Judgment and thought content normal. Cognition and memory are normal. She exhibits a depressed mood.          Assessment & Plan:

## 2012-01-10 NOTE — Assessment & Plan Note (Signed)
Patient is having significant anxiety and depression in relationship to dealing with her son. I encouraged her to reestablish with her psychologist to work on coping strategies and to work on setting boundaries. She will continue her Lexapro and alprazolam as needed. She will followup in one month.

## 2012-01-17 ENCOUNTER — Encounter: Payer: Self-pay | Admitting: Internal Medicine

## 2012-01-25 ENCOUNTER — Encounter: Payer: Self-pay | Admitting: Internal Medicine

## 2012-01-29 ENCOUNTER — Encounter: Payer: Self-pay | Admitting: Internal Medicine

## 2012-02-22 ENCOUNTER — Encounter: Payer: Self-pay | Admitting: Internal Medicine

## 2012-03-24 ENCOUNTER — Encounter: Payer: Self-pay | Admitting: Internal Medicine

## 2012-04-14 ENCOUNTER — Other Ambulatory Visit: Payer: Self-pay | Admitting: *Deleted

## 2012-04-14 DIAGNOSIS — N959 Unspecified menopausal and perimenopausal disorder: Secondary | ICD-10-CM

## 2012-04-14 MED ORDER — ESTRADIOL 1 MG PO TABS: 0.5000 mg | ORAL_TABLET | Freq: Every day | ORAL | Status: DC

## 2012-04-23 ENCOUNTER — Encounter: Payer: Self-pay | Admitting: Internal Medicine

## 2012-04-25 ENCOUNTER — Encounter: Payer: Self-pay | Admitting: Gastroenterology

## 2012-04-28 ENCOUNTER — Other Ambulatory Visit: Payer: Self-pay | Admitting: *Deleted

## 2012-04-28 DIAGNOSIS — F419 Anxiety disorder, unspecified: Secondary | ICD-10-CM

## 2012-04-28 MED ORDER — ALPRAZOLAM 0.5 MG PO TABS: 0.5000 mg | ORAL_TABLET | Freq: Every evening | ORAL | Status: DC | PRN

## 2012-04-28 NOTE — Telephone Encounter (Signed)
Pharmacy says that patient already has two refill son file. Rx was not called in.

## 2012-04-28 NOTE — Telephone Encounter (Signed)
Alprazolam request [last refill 01.17.13 #[id number]] Please advise.

## 2012-04-28 NOTE — Telephone Encounter (Signed)
Fine to refill same # and instructions

## 2012-04-29 ENCOUNTER — Ambulatory Visit (INDEPENDENT_AMBULATORY_CARE_PROVIDER_SITE_OTHER): Payer: Medicare Other | Admitting: Internal Medicine

## 2012-04-29 ENCOUNTER — Other Ambulatory Visit: Payer: Self-pay | Admitting: *Deleted

## 2012-04-29 DIAGNOSIS — F419 Anxiety disorder, unspecified: Secondary | ICD-10-CM

## 2012-04-29 DIAGNOSIS — F411 Generalized anxiety disorder: Secondary | ICD-10-CM

## 2012-04-29 MED ORDER — ALPRAZOLAM 0.5 MG PO TABS: 0.5000 mg | ORAL_TABLET | Freq: Every evening | ORAL | Status: DC | PRN

## 2012-04-29 NOTE — Telephone Encounter (Signed)
Fine to fill. #90 refill 1 

## 2012-04-30 ENCOUNTER — Other Ambulatory Visit: Payer: Self-pay | Admitting: Internal Medicine

## 2012-04-30 NOTE — Progress Notes (Signed)
  Subjective:    Patient ID: Michele Meyer, female    DOB: 08/30/1946, 67 y.o.   MRN: [id number]  HPI  Opened in error  Review of Systems     Objective:   Physical Exam        Assessment & Plan:

## 2012-05-01 NOTE — Telephone Encounter (Signed)
[facility] called to [facility]. 

## 2012-05-06 ENCOUNTER — Ambulatory Visit (INDEPENDENT_AMBULATORY_CARE_PROVIDER_SITE_OTHER): Payer: Medicare Other | Admitting: Internal Medicine

## 2012-05-06 ENCOUNTER — Encounter: Payer: Self-pay | Admitting: Internal Medicine

## 2012-05-06 VITALS — BP 166/76 | HR 65 | Temp 98.8°F | Resp 14 | Wt 159.5 lb

## 2012-05-06 DIAGNOSIS — R5383 Other fatigue: Secondary | ICD-10-CM

## 2012-05-06 DIAGNOSIS — M1710 Unilateral primary osteoarthritis, unspecified knee: Secondary | ICD-10-CM

## 2012-05-06 DIAGNOSIS — D510 Vitamin B12 deficiency anemia due to intrinsic factor deficiency: Secondary | ICD-10-CM

## 2012-05-06 DIAGNOSIS — M1990 Unspecified osteoarthritis, unspecified site: Secondary | ICD-10-CM

## 2012-05-06 DIAGNOSIS — R5381 Other malaise: Secondary | ICD-10-CM

## 2012-05-06 DIAGNOSIS — G4700 Insomnia, unspecified: Secondary | ICD-10-CM

## 2012-05-06 DIAGNOSIS — M179 Osteoarthritis of knee, unspecified: Secondary | ICD-10-CM

## 2012-05-06 DIAGNOSIS — E039 Hypothyroidism, unspecified: Secondary | ICD-10-CM

## 2012-05-06 LAB — CBC WITH DIFFERENTIAL/PLATELET
Eosinophils Absolute: 0.8 10*3/uL — ABNORMAL HIGH (ref 0.0–0.7)
Eosinophils Relative: 9.8 % — ABNORMAL HIGH (ref 0.0–5.0)
HCT: 37.9 % (ref 36.0–46.0)
Lymphocytes Relative: 22.4 % (ref 12.0–46.0)
Monocytes Absolute: 0.6 10*3/uL (ref 0.1–1.0)
Monocytes Relative: 7.9 % (ref 3.0–12.0)
Neutro Abs: 4.6 10*3/uL (ref 1.4–7.7)
Neutrophils Relative %: 59.1 % (ref 43.0–77.0)
Platelets: 217 10*3/uL (ref 150.0–400.0)
RDW: 12.6 % (ref 11.5–14.6)
WBC: 7.7 10*3/uL (ref 4.5–10.5)

## 2012-05-06 LAB — COMPREHENSIVE METABOLIC PANEL
CO2: 28 mEq/L (ref 19–32)
Calcium: 9.5 mg/dL (ref 8.4–10.5)
Chloride: 100 mEq/L (ref 96–112)
GFR: 78.19 mL/min (ref >60.00)
Glucose, Bld: 81 mg/dL (ref 70–99)
Sodium: 137 mEq/L (ref 135–145)
Total Bilirubin: 0.3 mg/dL (ref 0.3–1.2)
Total Protein: 7.4 g/dL (ref 6.0–8.3)

## 2012-05-06 MED ORDER — ZOLPIDEM TARTRATE 5 MG PO TABS: 5.0000 mg | ORAL_TABLET | Freq: Every evening | ORAL | Status: DC | PRN

## 2012-05-06 NOTE — Assessment & Plan Note (Signed)
Bilateral knees. Will set up orthopedic evaluation given no improvement with nonsteroidals and supportive care.

## 2012-05-06 NOTE — Assessment & Plan Note (Signed)
No improvement with Xanax. Will add Ambien as this has worked well for her in the past.

## 2012-05-06 NOTE — Assessment & Plan Note (Signed)
Likely multifactorial with increased anxiety, insomnia. Will check blood work including CBC, CMP, and thyroid function. Patient will followup in one month.

## 2012-05-06 NOTE — Progress Notes (Signed)
Subjective:    Patient ID: Michele Meyer, female    DOB: Sep 11, 1946, 66 y.o.   MRN: [id number]  HPI 66 year old female with history of anxiety presents for acute visit complaining of worsening fatigue. She notes that she has been caring for her sister-in-law who is ill with cancer. She has also been helping with her mother-in-law's care. She reports that she is sleeping poorly, waking frequently during the night. She reports significant generalized fatigue. She denies any focal symptoms such as constipation, intolerance to hot or cold temperatures. She has had some recent difficulty losing weight.  She also complains of worsening pain in her bilateral knees. She would like to establish care with orthopedic surgeon for further evaluation.  Outpatient Encounter Prescriptions as of 05/06/2012  Medication Sig Dispense Refill  . ALPRAZolam (XANAX) 0.5 MG tablet Take 1 tablet (0.5 mg total) by mouth at bedtime as needed.  90 tablet  1  . BYSTOLIC 5 MG tablet TAKE 1 TABLET BY MOUTH EVERYDAY  90 tablet  2  . escitalopram (LEXAPRO) 10 MG tablet Take 1 tablet (10 mg total) by mouth daily.  90 tablet  1  . estradiol (ESTRACE) 1 MG tablet Take 0.5 tablets (0.5 mg total) by mouth daily.  30 tablet  2  . lisinopril-hydrochlorothiazide (PRINZIDE,ZESTORETIC) 10-12.5 MG per tablet TAKE 1 TABLET BY MOUTH EVERY DAY  90 tablet  3  . meloxicam (MOBIC) 15 MG tablet       . vitamin E 400 UNIT capsule Take 400 Units by mouth daily.        Marland Kitchen DISCONTD: meloxicam (MOBIC) 15 MG tablet TAKE 1 TABLET BY MOUTH EVERY DAY  30 tablet  3  . nystatin (MYCOSTATIN) powder Apply to affected area 3 times daily  15 g  3  . zolpidem (AMBIEN) 5 MG tablet Take 1 tablet (5 mg total) by mouth at bedtime as needed for sleep.  30 tablet  1    Review of Systems  Constitutional: Positive for fatigue. Negative for fever, chills, appetite change and unexpected weight change.  HENT: Negative for ear pain, congestion, sore throat, trouble  swallowing, neck pain, voice change and sinus pressure.   Eyes: Negative for visual disturbance.  Respiratory: Negative for cough, shortness of breath, wheezing and stridor.   Cardiovascular: Negative for chest pain, palpitations and leg swelling.  Gastrointestinal: Negative for nausea, vomiting, abdominal pain, diarrhea, constipation, blood in stool, abdominal distention and anal bleeding.  Genitourinary: Negative for dysuria and flank pain.  Musculoskeletal: Positive for arthralgias. Negative for gait problem.  Skin: Negative for color change and rash.  Neurological: Negative for dizziness and headaches.  Hematological: Negative for adenopathy. Does not bruise/bleed easily.  Psychiatric/Behavioral: Positive for sleep disturbance. Negative for suicidal ideas and dysphoric mood. The patient is nervous/anxious.    BP 166/76  Pulse 65  Temp(Src) 98.8 F (37.1 C) (Oral)  Resp 14  Wt 159 lb 8 oz (72.349 kg)  SpO2 100%     Objective:   Physical Exam  Constitutional: She is oriented to person, place, and time. She appears well-developed and well-nourished. No distress.  HENT:  Head: Normocephalic and atraumatic.  Right Ear: External ear normal.  Left Ear: External ear normal.  Nose: Nose normal.  Mouth/Throat: Oropharynx is clear and moist. No oropharyngeal exudate.  Eyes: Conjunctivae are normal. Pupils are equal, round, and reactive to light. Right eye exhibits no discharge. Left eye exhibits no discharge. No scleral icterus.  Neck: Normal range of motion. Neck supple. No tracheal  deviation present. No thyromegaly present.  Cardiovascular: Normal rate, regular rhythm, normal heart sounds and intact distal pulses.  Exam reveals no gallop and no friction rub.   No murmur heard. Pulmonary/Chest: Effort normal and breath sounds normal. No respiratory distress. She has no wheezes. She has no rales. She exhibits no tenderness.  Musculoskeletal: Normal range of motion. She exhibits no edema  and no tenderness.  Lymphadenopathy:    She has no cervical adenopathy.  Neurological: She is alert and oriented to person, place, and time. No cranial nerve deficit. She exhibits normal muscle tone. Coordination normal.  Skin: Skin is warm and dry. No rash noted. She is not diaphoretic. No erythema. No pallor.  Psychiatric: She has a normal mood and affect. Her behavior is normal. Judgment and thought content normal.          Assessment & Plan:

## 2012-05-15 ENCOUNTER — Ambulatory Visit: Payer: Medicare Other | Admitting: Gastroenterology

## 2012-05-23 ENCOUNTER — Other Ambulatory Visit: Payer: Self-pay | Admitting: Internal Medicine

## 2012-06-11 ENCOUNTER — Ambulatory Visit: Payer: Medicare Other | Admitting: Gastroenterology

## 2012-07-08 ENCOUNTER — Ambulatory Visit (INDEPENDENT_AMBULATORY_CARE_PROVIDER_SITE_OTHER): Payer: Medicare Other | Admitting: Internal Medicine

## 2012-07-08 ENCOUNTER — Encounter: Payer: Self-pay | Admitting: Internal Medicine

## 2012-07-08 VITALS — BP 140/80 | HR 73 | Temp 98.9°F | Ht 62.5 in | Wt 154.5 lb

## 2012-07-08 DIAGNOSIS — H9209 Otalgia, unspecified ear: Secondary | ICD-10-CM

## 2012-07-08 DIAGNOSIS — H9203 Otalgia, bilateral: Secondary | ICD-10-CM

## 2012-07-08 DIAGNOSIS — R05 Cough: Secondary | ICD-10-CM

## 2012-07-08 MED ORDER — AZITHROMYCIN 250 MG PO TABS: ORAL_TABLET | ORAL | Status: AC

## 2012-07-08 NOTE — Progress Notes (Signed)
Subjective:    Patient ID: Michele Meyer, female    DOB: 09/14/1946, 66 y.o.   MRN: [id number]  HPI 66 year old female presents for acute visit complaining of one-week history of bilateral ear pain, sore throat, and cough productive of purulent sputum. She denies any fever or chills. She denies any change in hearing. She denies any drainage from her ears. She denies shortness of breath or chest pain. She has not been taking any medication for her symptoms.  Outpatient Encounter Prescriptions as of 07/08/2012  Medication Sig Dispense Refill  . ALPRAZolam (XANAX) 0.5 MG tablet Take 1 tablet (0.5 mg total) by mouth at bedtime as needed.  90 tablet  1  . BYSTOLIC 5 MG tablet TAKE 1 TABLET BY MOUTH EVERYDAY  90 tablet  2  . escitalopram (LEXAPRO) 10 MG tablet Take 1 tablet (10 mg total) by mouth daily.  90 tablet  1  . estradiol (ESTRACE) 1 MG tablet Take 0.5 tablets (0.5 mg total) by mouth daily.  30 tablet  2  . lisinopril-hydrochlorothiazide (PRINZIDE,ZESTORETIC) 10-12.5 MG per tablet TAKE 1 TABLET BY MOUTH EVERY DAY  90 tablet  3  . nystatin (MYCOSTATIN) powder Apply to affected area 3 times daily  15 g  3  . azithromycin (ZITHROMAX Z-PAK) 250 MG tablet Take 2 pills day 1, then 1 pill daily days 2-5  6 each  0  . vitamin E 400 UNIT capsule Take 400 Units by mouth daily.         BP 140/80  Pulse 73  Temp 98.9 F (37.2 C) (Oral)  Ht 5' 2.5" (1.588 m)  Wt 154 lb 8 oz (70.081 kg)  BMI 27.81 kg/m2  SpO2 96%  Review of Systems  Constitutional: Negative for fever, chills and unexpected weight change.  HENT: Positive for ear pain and sore throat. Negative for hearing loss, nosebleeds, congestion, facial swelling, rhinorrhea, sneezing, mouth sores, trouble swallowing, neck pain, neck stiffness, voice change, postnasal drip, sinus pressure, tinnitus and ear discharge.   Eyes: Negative for pain, discharge, redness and visual disturbance.  Respiratory: Positive for cough. Negative for chest  tightness, shortness of breath, wheezing and stridor.   Cardiovascular: Negative for chest pain, palpitations and leg swelling.  Musculoskeletal: Negative for myalgias and arthralgias.  Skin: Negative for color change and rash.  Neurological: Negative for dizziness, weakness, light-headedness and headaches.  Hematological: Negative for adenopathy.       Objective:   Physical Exam  Constitutional: She is oriented to person, place, and time. She appears well-developed and well-nourished. No distress.  HENT:  Head: Normocephalic and atraumatic.  Right Ear: Tympanic membrane and external ear normal.  Left Ear: Tympanic membrane and external ear normal.  Nose: Nose normal.  Mouth/Throat: Oropharynx is clear and moist. No oropharyngeal exudate, posterior oropharyngeal edema or posterior oropharyngeal erythema.  Eyes: Conjunctivae are normal. Pupils are equal, round, and reactive to light. Right eye exhibits no discharge. Left eye exhibits no discharge. No scleral icterus.  Neck: Normal range of motion. Neck supple. No tracheal deviation present. No thyromegaly present.  Cardiovascular: Normal rate, regular rhythm, normal heart sounds and intact distal pulses.  Exam reveals no gallop and no friction rub.   No murmur heard. Pulmonary/Chest: Effort normal and breath sounds normal. No accessory muscle usage. Not tachypneic. No respiratory distress. She has no decreased breath sounds. She has no wheezes. She has no rhonchi. She has no rales. She exhibits no tenderness.  Musculoskeletal: Normal range of motion. She exhibits no edema and  no tenderness.  Lymphadenopathy:    She has no cervical adenopathy.  Neurological: She is alert and oriented to person, place, and time. No cranial nerve deficit. She exhibits normal muscle tone. Coordination normal.  Skin: Skin is warm and dry. No rash noted. She is not diaphoretic. No erythema. No pallor.  Psychiatric: She has a normal mood and affect. Her behavior  is normal. Judgment and thought content normal.          Assessment & Plan:

## 2012-07-08 NOTE — Assessment & Plan Note (Addendum)
Likely secondary to bilateral middle ear effusions. Encourage patient to use nonsedating antihistamine such as Claritin and ibuprofen as needed. Patient will call if symptoms are not improving.

## 2012-07-08 NOTE — Assessment & Plan Note (Signed)
Pt with 6 day h/o cough productive of purulent sputum. Symptoms concerning for pertussis. 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  Pt will follow up if symptoms not improving over next few weeks.

## 2012-07-22 ENCOUNTER — Other Ambulatory Visit: Payer: Self-pay | Admitting: Internal Medicine

## 2012-07-29 NOTE — Telephone Encounter (Signed)
DONE

## 2012-09-01 ENCOUNTER — Other Ambulatory Visit: Payer: Self-pay | Admitting: Internal Medicine

## 2012-10-02 ENCOUNTER — Ambulatory Visit (INDEPENDENT_AMBULATORY_CARE_PROVIDER_SITE_OTHER): Payer: Medicare Other | Admitting: Internal Medicine

## 2012-10-02 ENCOUNTER — Encounter: Payer: Self-pay | Admitting: Internal Medicine

## 2012-10-02 VITALS — BP 142/80 | HR 71 | Temp 98.2°F | Ht 62.5 in | Wt 157.5 lb

## 2012-10-02 DIAGNOSIS — Z23 Encounter for immunization: Secondary | ICD-10-CM

## 2012-10-02 DIAGNOSIS — F419 Anxiety disorder, unspecified: Secondary | ICD-10-CM

## 2012-10-02 DIAGNOSIS — I10 Essential (primary) hypertension: Secondary | ICD-10-CM

## 2012-10-02 DIAGNOSIS — Z1239 Encounter for other screening for malignant neoplasm of breast: Secondary | ICD-10-CM

## 2012-10-02 DIAGNOSIS — R1013 Epigastric pain: Secondary | ICD-10-CM

## 2012-10-02 DIAGNOSIS — F411 Generalized anxiety disorder: Secondary | ICD-10-CM

## 2012-10-02 DIAGNOSIS — E785 Hyperlipidemia, unspecified: Secondary | ICD-10-CM

## 2012-10-02 DIAGNOSIS — Z1231 Encounter for screening mammogram for malignant neoplasm of breast: Secondary | ICD-10-CM

## 2012-10-02 LAB — COMPREHENSIVE METABOLIC PANEL
Albumin: 3.8 g/dL (ref 3.5–5.2)
CO2: 30 mEq/L (ref 19–32)
Chloride: 100 mEq/L (ref 96–112)
GFR: 73.72 mL/min (ref >60.00)
Glucose, Bld: 82 mg/dL (ref 70–99)
Potassium: 4.1 mEq/L (ref 3.5–5.1)
Sodium: 135 mEq/L (ref 135–145)
Total Protein: 7.1 g/dL (ref 6.0–8.3)

## 2012-10-02 LAB — MICROALBUMIN / CREATININE URINE RATIO
Creatinine,U: 76.9 mg/dL
Microalb, Ur: 0.3 mg/dL (ref 0.0–1.9)

## 2012-10-02 LAB — LDL CHOLESTEROL, DIRECT: Direct LDL: 150.4 mg/dL

## 2012-10-02 LAB — CBC WITH DIFFERENTIAL/PLATELET
Eosinophils Relative: 8 % — ABNORMAL HIGH (ref 0.0–5.0)
MCV: 89.5 fl (ref 78.0–100.0)
Monocytes Absolute: 0.5 10*3/uL (ref 0.1–1.0)
Monocytes Relative: 7.1 % (ref 3.0–12.0)
Neutrophils Relative %: 60.5 % (ref 43.0–77.0)
Platelets: 242 10*3/uL (ref 150.0–400.0)
WBC: 6.9 10*3/uL (ref 4.5–10.5)

## 2012-10-02 LAB — LIPID PANEL: Triglycerides: 90 mg/dL (ref 0.0–149.0)

## 2012-10-02 MED ORDER — PANTOPRAZOLE SODIUM 40 MG PO TBEC: 40.0000 mg | DELAYED_RELEASE_TABLET | Freq: Every day | ORAL | Status: DC

## 2012-10-02 MED ORDER — ALPRAZOLAM 0.5 MG PO TABS: 0.5000 mg | ORAL_TABLET | Freq: Every evening | ORAL | Status: DC | PRN

## 2012-10-02 MED ORDER — ZOSTER VACCINE LIVE 19400 UNT/0.65ML ~~LOC~~ SOLR: 0.6500 mL | Freq: Once | SUBCUTANEOUS | Status: DC

## 2012-10-02 NOTE — Assessment & Plan Note (Signed)
Mammogram to be scheduled today.

## 2012-10-02 NOTE — Progress Notes (Signed)
Subjective:    Patient ID: Michele Meyer, female    DOB: 1946-07-16, 66 y.o.   MRN: [id number]  HPI 66 year old female with history of anxiety, depression, hypertension presents for followup. Her primary concern today is a several week history of epigastric abdominal pain and bloating after eating. She has also had some episodes of loose stools. She denies any blood in her stool. She denies mucus in her stool. She denies any persistent abdominal pain. Abdominal pain only occurs after eating. It is not associated with nausea. She feels as if her stomach is distended after eating. She is frequently around children. She has not taken any recent trips.  She is also concerned about ongoing issues in her family and chronic anxiety and depression. She reports currently symptoms are well controlled with use of Lexapro and Xanax.  Outpatient Encounter Prescriptions as of 10/02/2012  Medication Sig Dispense Refill  . ALPRAZolam (XANAX) 0.5 MG tablet Take 1 tablet (0.5 mg total) by mouth at bedtime as needed.  90 tablet  1  . BYSTOLIC 5 MG tablet TAKE 1 TABLET BY MOUTH EVERYDAY  90 tablet  2  . escitalopram (LEXAPRO) 10 MG tablet TAKE 1 TABLET (10 MG TOTAL) BY MOUTH DAILY.  90 tablet  1  . estradiol (ESTRACE) 1 MG tablet Take 0.5 tablets (0.5 mg total) by mouth daily.  30 tablet  2  . lisinopril-hydrochlorothiazide (PRINZIDE,ZESTORETIC) 10-12.5 MG per tablet TAKE 1 TABLET BY MOUTH EVERY DAY  90 tablet  3  . nystatin (MYCOSTATIN/NYSTOP) 100000 UNIT/GM POWD APPLY TO AFFECTED AREA 3 TIMES DAILY  15 g  3  . vitamin E 400 UNIT capsule Take 400 Units by mouth daily.        Marland Kitchen DISCONTD: ALPRAZolam (XANAX) 0.5 MG tablet Take 1 tablet (0.5 mg total) by mouth at bedtime as needed.  90 tablet  1  . pantoprazole (PROTONIX) 40 MG tablet Take 1 tablet (40 mg total) by mouth daily.  30 tablet  3  . zoster vaccine live, PF, (ZOSTAVAX) 14782 UNT/0.65ML injection Inject 19,400 Units into the skin once.  1 each  0   BP  142/80  Pulse 71  Temp 98.2 F (36.8 C) (Oral)  Ht 5' 2.5" (1.588 m)  Wt 157 lb 8 oz (71.442 kg)  BMI 28.35 kg/m2  SpO2 97%  Review of Systems  Constitutional: Negative for fever, chills, appetite change, fatigue and unexpected weight change.  HENT: Negative for ear pain, congestion, sore throat, trouble swallowing, neck pain, voice change and sinus pressure.   Eyes: Negative for visual disturbance.  Respiratory: Negative for cough, shortness of breath, wheezing and stridor.   Cardiovascular: Negative for chest pain, palpitations and leg swelling.  Gastrointestinal: Positive for abdominal pain, diarrhea and abdominal distention. Negative for nausea, vomiting, constipation, blood in stool and anal bleeding.  Genitourinary: Negative for dysuria and flank pain.  Musculoskeletal: Negative for myalgias, arthralgias and gait problem.  Skin: Negative for color change and rash.  Neurological: Negative for dizziness and headaches.  Hematological: Negative for adenopathy. Does not bruise/bleed easily.  Psychiatric/Behavioral: Positive for dysphoric mood. Negative for suicidal ideas and disturbed wake/sleep cycle. The patient is nervous/anxious.        Objective:   Physical Exam  Constitutional: She is oriented to person, place, and time. She appears well-developed and well-nourished. No distress.  HENT:  Head: Normocephalic and atraumatic.  Right Ear: External ear normal.  Left Ear: External ear normal.  Nose: Nose normal.  Mouth/Throat: Oropharynx is clear and  moist. No oropharyngeal exudate.  Eyes: Conjunctivae normal are normal. Pupils are equal, round, and reactive to light. Right eye exhibits no discharge. Left eye exhibits no discharge. No scleral icterus.  Neck: Normal range of motion. Neck supple. No tracheal deviation present. No thyromegaly present.  Cardiovascular: Normal rate, regular rhythm, normal heart sounds and intact distal pulses.  Exam reveals no gallop and no friction  rub.   No murmur heard. Pulmonary/Chest: Effort normal and breath sounds normal. No respiratory distress. She has no wheezes. She has no rales. She exhibits no tenderness.  Abdominal: Soft. Bowel sounds are normal. She exhibits no distension and no mass. There is no tenderness. There is no rebound and no guarding.  Musculoskeletal: Normal range of motion. She exhibits no edema and no tenderness.  Lymphadenopathy:    She has no cervical adenopathy.  Neurological: She is alert and oriented to person, place, and time. No cranial nerve deficit. She exhibits normal muscle tone. Coordination normal.  Skin: Skin is warm and dry. No rash noted. She is not diaphoretic. No erythema. No pallor.  Psychiatric: She has a normal mood and affect. Her behavior is normal. Judgment and thought content normal.          Assessment & Plan:

## 2012-10-02 NOTE — Assessment & Plan Note (Signed)
Symptoms are concerning for infectious etiology. Question if she may have Giardia. Will send stool for culture and for ova and parasites. We'll also send blood work today including CBC, CMP, and H. pylori testing. Will start empiric PPI. Followup in 4 weeks or sooner if symptoms are worsening.

## 2012-10-02 NOTE — Assessment & Plan Note (Signed)
Blood pressure slightly elevated today. Will monitor again at visit in one month. If no improvement, would favor increasing dose of lisinopril. We'll check renal function and urine micro-albumen with labs today.

## 2012-10-02 NOTE — Assessment & Plan Note (Signed)
Will check lipids with labs today.  

## 2012-10-03 ENCOUNTER — Other Ambulatory Visit: Payer: Medicare Other

## 2012-10-03 LAB — FECAL OCCULT BLOOD, IMMUNOCHEMICAL: Fecal Occult Bld: NEGATIVE

## 2012-10-08 ENCOUNTER — Telehealth: Payer: Self-pay | Admitting: *Deleted

## 2012-10-08 MED ORDER — METRONIDAZOLE 500 MG PO TABS: 500.0000 mg | ORAL_TABLET | Freq: Two times a day (BID) | ORAL | Status: DC

## 2012-10-08 MED ORDER — CLARITHROMYCIN 500 MG PO TABS: 500.0000 mg | ORAL_TABLET | Freq: Two times a day (BID) | ORAL | Status: DC

## 2012-10-08 NOTE — Telephone Encounter (Signed)
Rx for Clarithromycin and Metronidazole sent to [facility] per result note by Dr. Dan Humphreys.

## 2012-10-08 NOTE — Telephone Encounter (Signed)
Message copied by Carin Primrose on Wed Oct 08, 2012  3:17 PM ------      Message from: Ronna Polio A      Created: Wed Oct 08, 2012  3:02 PM       OK to send.

## 2012-10-09 ENCOUNTER — Encounter: Payer: Self-pay | Admitting: *Deleted

## 2012-10-21 ENCOUNTER — Other Ambulatory Visit: Payer: Self-pay | Admitting: Internal Medicine

## 2012-10-23 ENCOUNTER — Other Ambulatory Visit: Payer: Self-pay | Admitting: Internal Medicine

## 2012-10-24 ENCOUNTER — Other Ambulatory Visit: Payer: Self-pay | Admitting: Internal Medicine

## 2012-10-24 NOTE — Telephone Encounter (Signed)
Rx called to [facility].

## 2012-10-28 DIAGNOSIS — N812 Incomplete uterovaginal prolapse: Secondary | ICD-10-CM | POA: Insufficient documentation

## 2012-10-28 DIAGNOSIS — N8111 Cystocele, midline: Secondary | ICD-10-CM | POA: Insufficient documentation

## 2012-10-28 DIAGNOSIS — M5430 Sciatica, unspecified side: Secondary | ICD-10-CM | POA: Insufficient documentation

## 2012-10-28 DIAGNOSIS — N3020 Other chronic cystitis without hematuria: Secondary | ICD-10-CM | POA: Insufficient documentation

## 2012-10-28 DIAGNOSIS — N393 Stress incontinence (female) (male): Secondary | ICD-10-CM | POA: Insufficient documentation

## 2012-10-28 DIAGNOSIS — Z91040 Latex allergy status: Secondary | ICD-10-CM | POA: Insufficient documentation

## 2012-10-28 DIAGNOSIS — M545 Low back pain, unspecified: Secondary | ICD-10-CM | POA: Insufficient documentation

## 2012-10-28 DIAGNOSIS — R339 Retention of urine, unspecified: Secondary | ICD-10-CM | POA: Insufficient documentation

## 2012-10-28 DIAGNOSIS — N3941 Urge incontinence: Secondary | ICD-10-CM | POA: Insufficient documentation

## 2012-10-29 ENCOUNTER — Ambulatory Visit: Payer: Medicare Other | Admitting: Internal Medicine

## 2012-11-04 ENCOUNTER — Encounter: Payer: Self-pay | Admitting: Internal Medicine

## 2012-11-04 ENCOUNTER — Ambulatory Visit (HOSPITAL_COMMUNITY): Payer: Medicare Other

## 2012-11-04 ENCOUNTER — Ambulatory Visit (INDEPENDENT_AMBULATORY_CARE_PROVIDER_SITE_OTHER): Payer: Medicare Other | Admitting: Internal Medicine

## 2012-11-04 VITALS — BP 142/90 | HR 87 | Temp 98.5°F | Ht 62.5 in | Wt 155.2 lb

## 2012-11-04 DIAGNOSIS — F419 Anxiety disorder, unspecified: Secondary | ICD-10-CM

## 2012-11-04 DIAGNOSIS — I10 Essential (primary) hypertension: Secondary | ICD-10-CM

## 2012-11-04 DIAGNOSIS — R0789 Other chest pain: Secondary | ICD-10-CM

## 2012-11-04 DIAGNOSIS — G4700 Insomnia, unspecified: Secondary | ICD-10-CM

## 2012-11-04 DIAGNOSIS — F411 Generalized anxiety disorder: Secondary | ICD-10-CM

## 2012-11-04 DIAGNOSIS — R071 Chest pain on breathing: Secondary | ICD-10-CM

## 2012-11-04 DIAGNOSIS — Z23 Encounter for immunization: Secondary | ICD-10-CM

## 2012-11-04 MED ORDER — ZOLPIDEM TARTRATE 5 MG PO TABS: 5.0000 mg | ORAL_TABLET | Freq: Every evening | ORAL | Status: DC | PRN

## 2012-11-04 MED ORDER — ALPRAZOLAM 0.5 MG PO TABS: 0.5000 mg | ORAL_TABLET | Freq: Three times a day (TID) | ORAL | Status: DC | PRN

## 2012-11-04 NOTE — Assessment & Plan Note (Signed)
Blood pressure elevated today but patient has been out of Bystolic for several days. Encouraged compliance with medication. Patient will call if blood pressure consistently greater than 140/90. Followup 3 months.

## 2012-11-04 NOTE — Assessment & Plan Note (Signed)
Symptoms of insomnia are persistent. Patient did not start taking Ambien as directed at last visit. We will start Ambien today. Followup in 3 months or sooner as needed.

## 2012-11-04 NOTE — Progress Notes (Signed)
Subjective:    Patient ID: Michele Meyer, female    DOB: 1946/09/19, 66 y.o.   MRN: [id number]  HPI 66 year old female with history of hypertension, anxiety, and insomnia presents for followup. She reports some ongoing symptoms of insomnia and subsequent fatigue. She reports that she typically sleeps for about 5 hours per night. She did not start taking Ambien as directed at her previous appointment. She has been using Xanax at night with minimal improvement.  In regards to hypertension, she reports she has been out of blood pressure medication for several days. She denies any chest pain or palpitations but has had a headache over the last few days.  In regards to her anxiety, she reports that symptoms are well controlled with Xanax.  Over the last several weeks she has noted some tenderness to palpation over her lateral chest. She denies any shortness of breath, cough, or known injury to her chest. Pain is not present at rest. Pain only occurs with palpation of the area.  Outpatient Encounter Prescriptions as of 11/04/2012  Medication Sig Dispense Refill  . ALPRAZolam (XANAX) 0.5 MG tablet Take 1 tablet (0.5 mg total) by mouth 3 (three) times daily as needed for sleep or anxiety.  90 tablet  2  . BYSTOLIC 5 MG tablet TAKE 1 TABLET BY MOUTH EVERYDAY  90 tablet  2  . escitalopram (LEXAPRO) 10 MG tablet TAKE 1 TABLET (10 MG TOTAL) BY MOUTH DAILY.  90 tablet  1  . estradiol (ESTRACE) 1 MG tablet TAKE 1/2 TABLET EVERY DAY  30 tablet  2  . lisinopril-hydrochlorothiazide (PRINZIDE,ZESTORETIC) 10-12.5 MG per tablet TAKE 1 TABLET BY MOUTH EVERY DAY  90 tablet  3  . nystatin (MYCOSTATIN/NYSTOP) 100000 UNIT/GM POWD APPLY TO AFFECTED AREA 3 TIMES DAILY  15 g  3  . pantoprazole (PROTONIX) 40 MG tablet Take 1 tablet (40 mg total) by mouth daily.  30 tablet  3  . vitamin E 400 UNIT capsule Take 400 Units by mouth daily.        Marland Kitchen zoster vaccine live, PF, (ZOSTAVAX) 47829 UNT/0.65ML injection Inject 19,400  Units into the skin once.  1 each  0   BP 142/90  Pulse 87  Temp 98.5 F (36.9 C) (Oral)  Ht 5' 2.5" (1.588 m)  Wt 155 lb 4 oz (70.421 kg)  BMI 27.94 kg/m2  SpO2 97%  Review of Systems  Constitutional: Negative for fever, chills, appetite change, fatigue and unexpected weight change.  HENT: Negative for ear pain, congestion, sore throat, trouble swallowing, neck pain, voice change and sinus pressure.   Eyes: Negative for visual disturbance.  Respiratory: Negative for cough, shortness of breath, wheezing and stridor.   Cardiovascular: Positive for chest pain. Negative for palpitations and leg swelling.  Gastrointestinal: Negative for nausea, vomiting, abdominal pain, diarrhea, constipation, blood in stool, abdominal distention and anal bleeding.  Genitourinary: Negative for dysuria and flank pain.  Musculoskeletal: Negative for myalgias, arthralgias and gait problem.  Skin: Negative for color change and rash.  Neurological: Negative for dizziness and headaches.  Hematological: Negative for adenopathy. Does not bruise/bleed easily.  Psychiatric/Behavioral: Negative for suicidal ideas, sleep disturbance and dysphoric mood. The patient is not nervous/anxious.        Objective:   Physical Exam  Constitutional: She is oriented to person, place, and time. She appears well-developed and well-nourished. No distress.  HENT:  Head: Normocephalic and atraumatic.  Right Ear: External ear normal.  Left Ear: External ear normal.  Nose: Nose normal.  Mouth/Throat: Oropharynx is clear and moist. No oropharyngeal exudate.  Eyes: Conjunctivae normal are normal. Pupils are equal, round, and reactive to light. Right eye exhibits no discharge. Left eye exhibits no discharge. No scleral icterus.  Neck: Normal range of motion. Neck supple. No tracheal deviation present. No thyromegaly present.  Cardiovascular: Normal rate, regular rhythm, normal heart sounds and intact distal pulses.  Exam reveals no  gallop and no friction rub.   No murmur heard. Pulmonary/Chest: Effort normal and breath sounds normal. No respiratory distress. She has no wheezes. She has no rales. She exhibits tenderness.    Musculoskeletal: Normal range of motion. She exhibits no edema and no tenderness.  Lymphadenopathy:    She has no cervical adenopathy.  Neurological: She is alert and oriented to person, place, and time. No cranial nerve deficit. She exhibits normal muscle tone. Coordination normal.  Skin: Skin is warm and dry. No rash noted. She is not diaphoretic. No erythema. No pallor.  Psychiatric: She has a normal mood and affect. Her behavior is normal. Judgment and thought content normal.          Assessment & Plan:

## 2012-11-04 NOTE — Assessment & Plan Note (Signed)
Patient with some left lateral chest wall pain which is worsened with palpation. Question if she may have traumatic injury to her left lateral rib cage. Will get chest x-ray for evaluation.

## 2012-11-04 NOTE — Assessment & Plan Note (Signed)
Symptoms of anxiety well controlled on current medications. We'll plan to continue. Followup 3 months or sooner as needed.

## 2012-11-06 ENCOUNTER — Ambulatory Visit (INDEPENDENT_AMBULATORY_CARE_PROVIDER_SITE_OTHER)
Admission: RE | Admit: 2012-11-06 | Discharge: 2012-11-06 | Disposition: A | Discharge status: Home / Self Care | Payer: Medicare Other | Source: Ambulatory Visit | Attending: Internal Medicine | Admitting: Internal Medicine

## 2012-11-06 DIAGNOSIS — R071 Chest pain on breathing: Secondary | ICD-10-CM

## 2012-11-06 DIAGNOSIS — R0789 Other chest pain: Secondary | ICD-10-CM

## 2012-11-10 ENCOUNTER — Telehealth: Payer: Self-pay | Admitting: Internal Medicine

## 2012-11-10 NOTE — Telephone Encounter (Signed)
Patient advised as instructed via telephone, Rx as stated below called to [facility] pharmacy.

## 2012-11-10 NOTE — Telephone Encounter (Signed)
Breath test for H. Pylori was positive.

## 2012-11-10 NOTE — Telephone Encounter (Signed)
Breath test for H. Pylori was positive, indicating recurrent infection. I would recommend treatment with the below meds x 14 days  1. Bismuth 525mg  po four times daily 2. Flagyl 250mg  po four times daily 3. Doxycycline 100mg  po twice daily  Along with her protonix that she is already taking.  Then, we should repeat H. Pylori breath test after treatment.

## 2012-11-20 ENCOUNTER — Other Ambulatory Visit: Payer: Self-pay | Admitting: Internal Medicine

## 2012-11-21 ENCOUNTER — Ambulatory Visit (HOSPITAL_COMMUNITY): Admission: RE | Admit: 2012-11-21 | Payer: Medicare Other | Source: Ambulatory Visit

## 2012-11-24 ENCOUNTER — Encounter: Payer: Self-pay | Admitting: Internal Medicine

## 2012-11-27 ENCOUNTER — Telehealth: Payer: Self-pay | Admitting: General Practice

## 2012-11-27 NOTE — Telephone Encounter (Signed)
PA for ESTRADIOL started on 11/27/12.

## 2012-12-04 ENCOUNTER — Telehealth: Payer: Self-pay | Admitting: Internal Medicine

## 2012-12-04 NOTE — Telephone Encounter (Signed)
Patient calling in regards to her treatment regimen for H. Pylori Bacteria.  Patient is still symptomatic at this point and has not been able to accurately follow treatment regimen.  Patient became confused and stopped taking her Protonix while taking the antibiotics.  She was able to complete the Doxycyline however became confused with the Metronidazole and was only taking 2 times a day instead of four and then developed a stomach virus on 12/01/12 and has not taken since and now has 30 pills remaining.  Patient would like to know if she needs to be retested for H.Pylori or if she needs to restart the treatment regimen or just take the remaining doses of Metronidazole and restart the protonix. States pain is a constant burning sensation from breastbone down to abdomen and around waist line.   OFFICE NOTE:  PLEASE FOLLOW UP WITH PATIENT

## 2012-12-04 NOTE — Telephone Encounter (Signed)
We should have her go to [facility] and repeat a breath test for H. Pylori. Then, she should have follow up in 2 weeks or so.

## 2012-12-05 ENCOUNTER — Telehealth: Payer: Self-pay | Admitting: Internal Medicine

## 2012-12-05 NOTE — Telephone Encounter (Signed)
Patient returning your call  # [phone].

## 2012-12-05 NOTE — Telephone Encounter (Signed)
Called pt yesterday and this morning no answer.

## 2012-12-05 NOTE — Telephone Encounter (Signed)
Patient Information:  Caller Name: Lisabeth  Phone: [phone]  Patient: , Michele Meyer  Gender: Female  DOB: Dec 05, 1946  Age: 66 Years  PCP: Ronna Polio (Adults only)  Office Follow Up:  Does the office need to follow up with this patient?: Yes  Instructions For The Office: Please follow up with patient on Monday , 12/16, regarding lab test needed at [facility] for Hypylori.  Verfified phone number in EPIC is correct.   Symptoms  Reason For Call & Symptoms: Caller states she spoke with someone in office today regarding setting up a test at [facility] to recheck patient for Hpylori and has not heard back.  Reviewed EPIC and noted that Jessica,CMA had been trying to reach patient today and was unsuccessful. . Call placed to clinic and spoke with Erie Noe who reported Shanda Bumps is gone for the day.  Erie Noe spoke with Dr. Dan Humphreys during this call . Dr. Dan Humphreys advised to tell patient that Shanda Bumps would call her back on Monday regarding tests needed.  Relayed Dr. Tilman Neat message to caller. Caller verbalized understanding and agreement.  Reviewed Health History In EMR: N/A  Reviewed Medications In EMR: N/A  Reviewed Allergies In EMR: N/A  Reviewed Surgeries / Procedures: N/A  Date of Onset of Symptoms: Unknown  Guideline(s) Used:  No Protocol Available - Information Only  Disposition Per Guideline:   Home Care  Reason For Disposition Reached:   Information only question and nurse able to answer  Advice Given:  Call Back If:  New symptoms develop  You become worse.

## 2012-12-09 NOTE — Telephone Encounter (Signed)
Pt notified to go to [facility] at [facility] to get testing completed. Also states she uses [facility] on [address] if an Rx needs to be sent in.

## 2012-12-10 ENCOUNTER — Telehealth: Payer: Self-pay | Admitting: Internal Medicine

## 2012-12-10 NOTE — Telephone Encounter (Signed)
Repeat H. Pylori breath test was negative.

## 2012-12-10 NOTE — Telephone Encounter (Signed)
Pt.notified

## 2012-12-22 ENCOUNTER — Ambulatory Visit: Payer: Self-pay | Admitting: Internal Medicine

## 2012-12-29 ENCOUNTER — Encounter: Payer: Self-pay | Admitting: Internal Medicine

## 2013-01-02 ENCOUNTER — Telehealth: Payer: Self-pay | Admitting: *Deleted

## 2013-01-02 NOTE — Telephone Encounter (Signed)
Called [phone] for prior authorization(sarah) said the authorization was approved on 01.09.2014 she will sent a fax of the approval

## 2013-01-13 ENCOUNTER — Encounter: Payer: Self-pay | Admitting: Internal Medicine

## 2013-01-15 ENCOUNTER — Other Ambulatory Visit: Payer: Self-pay | Admitting: Orthopedic Surgery

## 2013-01-15 MED ORDER — DEXAMETHASONE SODIUM PHOSPHATE 10 MG/ML IJ SOLN: 10.0000 mg | Freq: Once | INTRAMUSCULAR | Status: DC

## 2013-01-15 NOTE — Progress Notes (Signed)
Preoperative surgical orders have been place into the Epic hospital system for Michele Meyer on 01/15/2013, 12:20 PM  by Patrica Duel for surgery on 02/18/2013.  Preop Bilateral Total Knee orders including Epidural per Anesthesia, IV Tylenol, and IV Decadron as long as there are no contraindications to the above medications. Avel Peace, PA-C

## 2013-01-29 ENCOUNTER — Encounter: Payer: Self-pay | Admitting: Adult Health

## 2013-01-29 ENCOUNTER — Ambulatory Visit (INDEPENDENT_AMBULATORY_CARE_PROVIDER_SITE_OTHER): Payer: Medicare Other | Admitting: Adult Health

## 2013-01-29 VITALS — BP 148/78 | HR 64 | Temp 98.2°F | Resp 16 | Ht 64.0 in | Wt 153.0 lb

## 2013-01-29 DIAGNOSIS — F329 Major depressive disorder, single episode, unspecified: Secondary | ICD-10-CM

## 2013-01-29 DIAGNOSIS — F419 Anxiety disorder, unspecified: Secondary | ICD-10-CM

## 2013-01-29 DIAGNOSIS — R232 Flushing: Secondary | ICD-10-CM

## 2013-01-29 DIAGNOSIS — F411 Generalized anxiety disorder: Secondary | ICD-10-CM

## 2013-01-29 DIAGNOSIS — I10 Essential (primary) hypertension: Secondary | ICD-10-CM

## 2013-01-29 NOTE — Assessment & Plan Note (Signed)
Patient has recently suffered the death of her mother. Feels increased anxiety. She take xanax at bedtime to help her relax and get to sleep. Advised that she could take this 3 times a day as needed. If she has days where she feels her anxiety is worse, she has the option of taking it. She still has enough medication. No need to refill at this time.

## 2013-01-29 NOTE — Assessment & Plan Note (Signed)
Discussed with patient that she is on the lowest does of Lexapro. If this has helped her in the past, it is prudent to increase the dose to 20 mg. If this does not prove to be helpful, we can then switch her to another SSRI. Patient agreed but she wishes to increase to 15 mg daily. She will be taking 1.5 tablets daily. She understands that dose adjustments sometime can take up to 2-3 weeks for benefits to be appreciated.

## 2013-01-29 NOTE — Assessment & Plan Note (Addendum)
Cheeks are flushed. There is no rash. This past Monday she had 22 mls of fluid drawn off of right knee. She was also given a steroid injection in the knee. Her b/p is not elevated enough where I would think the flushing is a result. But, I do believe the flushing, which is just on her cheeks, is a reaction for the steroid injection she received. She also has a HA which is also a side effect of steroid.

## 2013-01-29 NOTE — Progress Notes (Signed)
Subjective:    Patient ID: Michele Meyer, female    DOB: Jan 15, 1946, 67 y.o.   MRN: [id number]  HPI  Patient presents to clinic today thinking that she has an appointment with Dr. Dan Humphreys only to realize her appointment is not until next week. She presents with c/o red cheeks, HA and concerns about her b/p.  She reports having fluid extracted from r knee on Monday and steroid injection (22 cc of fluid removed). Today she woke up with flushed cheeks and HA. She is concerned that this may be her b/p going up. She is very anxious about having a stroke. Her mother died in 11-15-2012 from a CVA.  She reports taking her medications regularly and checking her b/p periodically. Usually, she states that her systolic stays around the high 140s. Today, the CMA first took her b/p and had a reading in the high 150s so she is very concerned. I will re-check her b/p prior to her leaving clinic.  Also, patient reports that she does not think her Lexapro is helping her any more. She is grieving the loss of her mother and she feels she needs more medication or a different one.  Patient is also having some increased anxiety associated with racing thoughts and issues with her son who also battles issues with anxiety.    Current Outpatient Prescriptions on File Prior to Visit  Medication Sig Dispense Refill  . ALPRAZolam (XANAX) 0.5 MG tablet Take 1 tablet (0.5 mg total) by mouth 3 (three) times daily as needed for sleep or anxiety.  90 tablet  2  . BYSTOLIC 5 MG tablet TAKE 1 TABLET BY MOUTH EVERYDAY  90 tablet  2  . escitalopram (LEXAPRO) 10 MG tablet       . estradiol (ESTRACE) 1 MG tablet TAKE 1/2 TABLET EVERY DAY  30 tablet  2  . lisinopril-hydrochlorothiazide (PRINZIDE,ZESTORETIC) 10-12.5 MG per tablet TAKE 1 TABLET BY MOUTH EVERY DAY  90 tablet  3  . pantoprazole (PROTONIX) 40 MG tablet Take 1 tablet (40 mg total) by mouth daily.  30 tablet  3  . nystatin (MYCOSTATIN/NYSTOP) 100000 UNIT/GM POWD APPLY TO  AFFECTED AREA 3 TIMES DAILY  15 g  3  . vitamin E 400 UNIT capsule Take 400 Units by mouth daily.        Marland Kitchen zolpidem (AMBIEN) 5 MG tablet Take 1 tablet (5 mg total) by mouth at bedtime as needed for sleep.  30 tablet  1  . zoster vaccine live, PF, (ZOSTAVAX) 40981 UNT/0.65ML injection Inject 19,400 Units into the skin once.  1 each  0     Review of Systems  Constitutional: Negative for fever and chills.  HENT: Negative for facial swelling.   Respiratory: Negative for chest tightness, shortness of breath and wheezing.   Cardiovascular: Negative for chest pain and palpitations.  Gastrointestinal: Negative.   Genitourinary: Negative.   Neurological: Positive for headaches.  Psychiatric/Behavioral: The patient is nervous/anxious.     BP 148/78  Pulse 64  Temp 98.2 F (36.8 C) (Oral)  Resp 16  Ht 5\' 4"  (1.626 m)  Wt 153 lb (69.4 kg)  BMI 26.26 kg/m2  SpO2 98%     Objective:   Physical Exam  Constitutional: She is oriented to person, place, and time. She appears well-developed and well-nourished. No distress.  Cardiovascular: Normal rate, regular rhythm and normal heart sounds.  Exam reveals no gallop.   No murmur heard. Pulmonary/Chest: Effort normal and breath sounds normal. No respiratory distress.  Abdominal: Soft. Bowel sounds are normal.  Neurological: She is alert and oriented to person, place, and time.  Skin: Skin is warm and dry.       Flushed cheeks without any evidence of rash.  Psychiatric: She has a normal mood and affect. Her behavior is normal. Judgment and thought content normal.       Slightly anxious about HA and flushed cheeks.        Assessment & Plan:

## 2013-01-29 NOTE — Patient Instructions (Addendum)
   Increase your Bystolic to 10 mg daily.   I have increased your Lexapro to 15 mg daily. Take 1.5 tablets of the 10 mg tablets you currently have.  You can take the xanax (for anxiety) up to 3 times a day if needed.  Try some tylenol for your headache  You will see Dr. Dan Humphreys on 02/06/13

## 2013-01-29 NOTE — Assessment & Plan Note (Addendum)
Spent time talking to patient trying to allay anxiety regarding her systolic b/p and her fears of having a stroke. Her mother recently passed away November 04, 2012) with a stoke. She is not currently exercising which I encouraged her to try to incorporate into her daily routine which would help not only with her weight, but also with her anxiety and b/p. I did increase her bystolic to 10 mg daily. She has an appt with Dr. Dan Humphreys next week so we will follow up on how she has been tolerating increase. Note, greater than 30 min were spent in direct face to face contact with patient discussing above.

## 2013-02-06 ENCOUNTER — Ambulatory Visit: Payer: Medicare Other | Admitting: Internal Medicine

## 2013-02-18 ENCOUNTER — Inpatient Hospital Stay: Admit: 2013-02-18 | Payer: Self-pay | Admitting: Orthopedic Surgery

## 2013-02-18 SURGERY — ARTHROPLASTY, KNEE, BILATERAL, TOTAL
Anesthesia: Choice | Site: Knee | Laterality: Bilateral

## 2013-02-25 ENCOUNTER — Encounter: Payer: Self-pay | Admitting: Internal Medicine

## 2013-02-25 ENCOUNTER — Ambulatory Visit (INDEPENDENT_AMBULATORY_CARE_PROVIDER_SITE_OTHER): Payer: Medicare Other | Admitting: Internal Medicine

## 2013-02-25 VITALS — BP 126/70 | HR 52 | Temp 98.0°F | Wt 152.0 lb

## 2013-02-25 DIAGNOSIS — F329 Major depressive disorder, single episode, unspecified: Secondary | ICD-10-CM

## 2013-02-25 DIAGNOSIS — M549 Dorsalgia, unspecified: Secondary | ICD-10-CM | POA: Insufficient documentation

## 2013-02-25 DIAGNOSIS — E785 Hyperlipidemia, unspecified: Secondary | ICD-10-CM

## 2013-02-25 DIAGNOSIS — I10 Essential (primary) hypertension: Secondary | ICD-10-CM

## 2013-02-25 DIAGNOSIS — R002 Palpitations: Secondary | ICD-10-CM

## 2013-02-25 LAB — COMPREHENSIVE METABOLIC PANEL
Albumin: 3.8 g/dL (ref 3.5–5.2)
Alkaline Phosphatase: 76 U/L (ref 39–117)
BUN: 19 mg/dL (ref 6–23)
CO2: 30 mEq/L (ref 19–32)
Calcium: 9.3 mg/dL (ref 8.4–10.5)
Chloride: 97 mEq/L (ref 96–112)
GFR: 69.69 mL/min (ref >60.00)
Glucose, Bld: 82 mg/dL (ref 70–99)
Potassium: 4.6 mEq/L (ref 3.5–5.1)
Sodium: 134 mEq/L — ABNORMAL LOW (ref 135–145)
Total Protein: 6.9 g/dL (ref 6.0–8.3)

## 2013-02-25 LAB — CBC WITH DIFFERENTIAL/PLATELET
Basophils Relative: 0.5 % (ref 0.0–3.0)
Eosinophils Relative: 3.1 % (ref 0.0–5.0)
Lymphocytes Relative: 18.9 % (ref 12.0–46.0)
Monocytes Absolute: 0.6 10*3/uL (ref 0.1–1.0)
Monocytes Relative: 7.4 % (ref 3.0–12.0)
Neutrophils Relative %: 70.1 % (ref 43.0–77.0)
Platelets: 253 10*3/uL (ref 150.0–400.0)
RBC: 4.3 Mil/uL (ref 3.87–5.11)
WBC: 8.7 10*3/uL (ref 4.5–10.5)

## 2013-02-25 LAB — LIPID PANEL
Cholesterol: 226 mg/dL — ABNORMAL HIGH (ref 0–200)
HDL: 80.8 mg/dL (ref >39.00)
Total CHOL/HDL Ratio: 3
Triglycerides: 86 mg/dL (ref 0.0–149.0)

## 2013-02-25 NOTE — Assessment & Plan Note (Signed)
Symptoms improved with current dose of Lexapro. Will continue.

## 2013-02-25 NOTE — Assessment & Plan Note (Signed)
Recent lower back pain, most consistent with muscular strain over lumbar paraspinal muscles. Will start Meloxicam to see if any improvement. Follow up 4 weeks and prn. If no improvement, will set up PT.

## 2013-02-25 NOTE — Assessment & Plan Note (Signed)
Noted to have lightheadedness and fatigue after change in Bystolic to 10mg  daily.Pt decreased to 5mg  daily with no improvement. Sinus bradycardia noted on EKG today. Will hold Bystolic for now and monitor HR and BP. Will set up evaluation with cardiology. Question if holter monitor and/or stress test might be helpful.

## 2013-02-25 NOTE — Patient Instructions (Addendum)
Hold Bystolic. We will set up cardiology evaluation.  Follow up 4 weeks

## 2013-02-25 NOTE — Assessment & Plan Note (Signed)
Intermittent palpitations noted by pt. Sinus bradycardia noted on exam today. Pt reports no improvement with decrease in Bystolic to 5mg  daily. Will hold bystolic. Will set up cardiology evaluation. Question if holter monitor might be helpful for further evaluation.

## 2013-02-25 NOTE — Progress Notes (Signed)
Subjective:    Patient ID: Michele Meyer, female    DOB: 05-20-1946, 67 y.o.   MRN: [id number]  HPI 67 year old female with history of hypertension, anxiety presents for followup. She reports that she recently underwent bilateral knee cortisone injection for osteoarthritis. After that event, she had some flushing and increased palpitations. She was seen by the nurse practitioner in our clinic in her dose of Bystolic was increased to 10 mg daily. After increasing her dose, she noted frequent episodes of palpitations and lightheadedness. She reports that her blood pressure has been lower than normal when she checks it, typically less than 120/80. She tried decreasing her dose of Bystolic to 5 mg daily with no improvement. She also notes some fatigue. She denies any chest pain, diaphoresis, dyspnea.  She also notes some aching pain in her lower back. This is been present for several weeks. It does not radiate. It is improved by changes in position. She denies any trauma to her back. She denies any fever or chills. She is not currently taking any medication for this.  She notes that symptoms of anxiety/depression have improved with current dose of Lexapro.  Outpatient Encounter Prescriptions as of 02/25/2013  Medication Sig Dispense Refill  . ALPRAZolam (XANAX) 0.5 MG tablet Take 1 tablet (0.5 mg total) by mouth 3 (three) times daily as needed for sleep or anxiety.  90 tablet  2  . escitalopram (LEXAPRO) 10 MG tablet       . estradiol (ESTRACE) 1 MG tablet TAKE 1/2 TABLET EVERY DAY  30 tablet  2  . lisinopril-hydrochlorothiazide (PRINZIDE,ZESTORETIC) 10-12.5 MG per tablet TAKE 1 TABLET BY MOUTH EVERY DAY  90 tablet  3  . nystatin (MYCOSTATIN/NYSTOP) 100000 UNIT/GM POWD APPLY TO AFFECTED AREA 3 TIMES DAILY  15 g  3  . pantoprazole (PROTONIX) 40 MG tablet Take 1 tablet (40 mg total) by mouth daily.  30 tablet  3  . [DISCONTINUED] BYSTOLIC 5 MG tablet TAKE 1 TABLET BY MOUTH EVERYDAY  90 tablet  2  .  meloxicam (MOBIC) 15 MG tablet       . vitamin E 400 UNIT capsule Take 400 Units by mouth daily.        Marland Kitchen zolpidem (AMBIEN) 5 MG tablet Take 1 tablet (5 mg total) by mouth at bedtime as needed for sleep.  30 tablet  1  . zoster vaccine live, PF, (ZOSTAVAX) 64403 UNT/0.65ML injection Inject 19,400 Units into the skin once.  1 each  0   No facility-administered encounter medications on file as of 02/25/2013.   BP 126/70  Pulse 52  Temp(Src) 98 F (36.7 C) (Oral)  Wt 152 lb (68.947 kg)  BMI 26.08 kg/m2  SpO2 98%  Review of Systems  Constitutional: Positive for fatigue. Negative for fever, chills, appetite change and unexpected weight change.  HENT: Negative for ear pain, congestion, sore throat, trouble swallowing, neck pain, voice change and sinus pressure.   Eyes: Negative for visual disturbance.  Respiratory: Negative for cough, shortness of breath, wheezing and stridor.   Cardiovascular: Positive for palpitations. Negative for chest pain and leg swelling.  Gastrointestinal: Negative for nausea, vomiting, abdominal pain, diarrhea, constipation, blood in stool, abdominal distention and anal bleeding.  Genitourinary: Negative for dysuria and flank pain.  Musculoskeletal: Negative for myalgias, arthralgias and gait problem.  Skin: Negative for color change and rash.  Neurological: Negative for dizziness and headaches.  Hematological: Negative for adenopathy. Does not bruise/bleed easily.  Psychiatric/Behavioral: Negative for suicidal ideas, sleep disturbance and  dysphoric mood. The patient is not nervous/anxious.        Objective:   Physical Exam  Constitutional: She is oriented to person, place, and time. She appears well-developed and well-nourished. No distress.  HENT:  Head: Normocephalic and atraumatic.  Right Ear: External ear normal.  Left Ear: External ear normal.  Nose: Nose normal.  Mouth/Throat: Oropharynx is clear and moist. No oropharyngeal exudate.  Eyes: Conjunctivae  are normal. Pupils are equal, round, and reactive to light. Right eye exhibits no discharge. Left eye exhibits no discharge. No scleral icterus.  Neck: Normal range of motion. Neck supple. No tracheal deviation present. No thyromegaly present.  Cardiovascular: Regular rhythm, normal heart sounds and intact distal pulses.  Bradycardia present.  Exam reveals no gallop and no friction rub.   No murmur heard. Pulmonary/Chest: Effort normal and breath sounds normal. No accessory muscle usage. Not tachypneic. No respiratory distress. She has no decreased breath sounds. She has no wheezes. She has no rhonchi. She has no rales. She exhibits no tenderness.  Musculoskeletal: She exhibits no edema and no tenderness.       Lumbar back: She exhibits decreased range of motion, tenderness and pain. She exhibits no bony tenderness.  Lymphadenopathy:    She has no cervical adenopathy.  Neurological: She is alert and oriented to person, place, and time. No cranial nerve deficit. She exhibits normal muscle tone. Coordination normal.  Skin: Skin is warm and dry. No rash noted. She is not diaphoretic. No erythema. No pallor.  Psychiatric: She has a normal mood and affect. Her behavior is normal. Judgment and thought content normal.          Assessment & Plan:

## 2013-02-26 ENCOUNTER — Ambulatory Visit (INDEPENDENT_AMBULATORY_CARE_PROVIDER_SITE_OTHER): Payer: Medicare Other | Admitting: Cardiovascular Disease

## 2013-02-26 ENCOUNTER — Telehealth: Payer: Self-pay | Admitting: Internal Medicine

## 2013-02-26 ENCOUNTER — Ambulatory Visit: Payer: Medicare Other | Admitting: Internal Medicine

## 2013-02-26 ENCOUNTER — Encounter: Payer: Self-pay | Admitting: Cardiovascular Disease

## 2013-02-26 VITALS — BP 146/70 | HR 53 | Ht 63.5 in | Wt 152.5 lb

## 2013-02-26 DIAGNOSIS — R079 Chest pain, unspecified: Secondary | ICD-10-CM

## 2013-02-26 DIAGNOSIS — R002 Palpitations: Secondary | ICD-10-CM

## 2013-02-26 LAB — LDL CHOLESTEROL, DIRECT: Direct LDL: 135.2 mg/dL

## 2013-02-26 NOTE — Progress Notes (Signed)
Flonnie Meyer Date of Birth  Jun 07, 1946       [facility] Office [address]  [address], [address] [place], [place]  [address]   [place], [place]  [address] [phone]     [phone]   Fax  [phone]    Fax [phone]  Problem List: 1. Dizziness 2. Palpitatations  History of Present Illness:  Michele Meyer is a 67 yo who is referred here today for dizziness and palpitations.   Her labs from Dr. Tilman Neat office were normal.  She has not felt well for the past 3-4 weeks.  She exercises on occasion - limited by knee pain.   She has been busy with her mother-in-law's death.  She needs to have bilateral knee replacements.  She was previously on Bystolic  10 mg a day but that was discontinued yesterday because of her symptoms. In addition, the lisinopril HCT was held and she did not take it today.  She has a headache today.  She has not noticed any improvement in here symptoms.  She has improved her diet over the past year and has lost 8 lbs.   Current Outpatient Prescriptions on File Prior to Visit  Medication Sig Dispense Refill  . ALPRAZolam (XANAX) 0.5 MG tablet Take 1 tablet (0.5 mg total) by mouth 3 (three) times daily as needed for sleep or anxiety.  90 tablet  2  . escitalopram (LEXAPRO) 10 MG tablet Take 10 mg by mouth daily.       Marland Kitchen estradiol (ESTRACE) 1 MG tablet TAKE 1/2 TABLET EVERY DAY  30 tablet  2  . lisinopril-hydrochlorothiazide (PRINZIDE,ZESTORETIC) 10-12.5 MG per tablet TAKE 1 TABLET BY MOUTH EVERY DAY  90 tablet  3  . meloxicam (MOBIC) 15 MG tablet Take 15 mg by mouth daily.       Marland Kitchen nystatin (MYCOSTATIN/NYSTOP) 100000 UNIT/GM POWD APPLY TO AFFECTED AREA 3 TIMES DAILY  15 g  3  . pantoprazole (PROTONIX) 40 MG tablet Take 1 tablet (40 mg total) by mouth daily.  30 tablet  3  . zoster vaccine live, PF, (ZOSTAVAX) 55732 UNT/0.65ML injection Inject 19,400 Units into the skin once.  1 each  0   No current  facility-administered medications on file prior to visit.    Allergies  Allergen Reactions  . Codeine   . Erythromycin   . Penicillins Cross Reactors   . Prednisone     Headache, GI upset    Past Medical History  Diagnosis Date  . Anxiety   . Cystocele   . Incomplete bladder emptying   . Chronic cystitis   . Stress incontinence   . Depression   . UTI (lower urinary tract infection)   . Skin cancer   . Arthritis   . Gestational diabetes mellitus   . Endometriosis     Past Surgical History  Procedure Laterality Date  . Removal of first rib      bilaterally  . Prolapsed bladder      Repair Dr.Cope  . Abdominal hysterectomy    . Vein ligation and stripping    . Breast biopsy    . Tonsillectomy    . Appendectomy    . Breast lumpectomy    . Pubovaginal sling    . Anterior and posterior vaginal repair      History  Smoking status  . Never Smoker   Smokeless tobacco  . Never Used    History  Alcohol Use  .  Yes    Comment: rarely    Family History  Problem Relation Age of Onset  . Diabetes    . Hypertension    . Depression    . Cervical cancer      Reviw of Systems:  Reviewed in the HPI.  All other systems are negative.  Physical Exam: Blood pressure 146/70, pulse 53, height 5' 3.5" (1.613 m), weight 152 lb 8 oz (69.174 kg). General: Well developed, well nourished, in no acute distress.  Head: Normocephalic, atraumatic, sclera non-icteric, mucus membranes are moist,   Neck: Supple. Carotids are 2 + without bruits. No JVD   Lungs: Clear   Heart: RR, Normal S1, S2  Abdomen: Soft, non-tender, non-distended with normal bowel sounds.  Msk:  Strength and tone are normal   Extremities: No clubbing or cyanosis. No edema.  Distal pedal pulses are 2+ and equal    Neuro: CN II - XII intact.  Alert and oriented X 3.   Psych:  Normal   ECG: February 26, 2013:  Sinus brady at 5, otherwise normal  Assessment / Plan:

## 2013-02-26 NOTE — Telephone Encounter (Signed)
Caller: Judy/Patient; Phone: [phone]; Reason for Call: She missed call from office.  I looked in Epic and saw note about her labs were fine.  I also checked with her to see if her sxs of lightheadedness had improved since stopping Bystolic.  She said she had not noticed any improvement.

## 2013-02-26 NOTE — Assessment & Plan Note (Signed)
Michele Meyer is referred here today for palpitations a slow heart rate. She was previously on Bystolic but this was discontinued yesterday. He's not yet feeling better but I suspect that she will. Her heart rate is still slow.  We'll get an echocardiogram for further evaluation of her left ventricular function and left ventricular size. I suspect that she'll feel better once her blood pressure and heart rate get up to the normal range. We will need to restart the lisinopril HCTZ at some point.  I'll see her again in the office in a month or so for followup evaluation.  She mentioned to me she's had episodes of tachycardia as well as these episodes of bradycardia. It is quite possible that she has sick sinus syndrome and may need a pacemaker at some point.

## 2013-02-26 NOTE — Patient Instructions (Addendum)
Your physician wants you to follow-up in: 1 month with Dr. Elease Hashimoto. You will receive a reminder letter in the mail two months in advance. If you don't receive a letter, please call our office to schedule the follow-up appointment.  Your physician has requested that you have an echocardiogram. Echocardiography is a painless test that uses sound waves to create images of your heart. It provides your doctor with information about the size and shape of your heart and how well your heart's chambers and valves are working. This procedure takes approximately one hour. There are no restrictions for this procedure.

## 2013-02-26 NOTE — Telephone Encounter (Signed)
Given that lightheadedness is persistent, I would strongly recommend that she keep cardiology evaluation scheduled for next week.

## 2013-03-05 ENCOUNTER — Other Ambulatory Visit: Payer: Self-pay | Admitting: Internal Medicine

## 2013-03-09 ENCOUNTER — Telehealth: Payer: Self-pay | Admitting: *Deleted

## 2013-03-09 ENCOUNTER — Ambulatory Visit: Payer: Medicare Other | Admitting: Cardiovascular Disease

## 2013-03-09 NOTE — Telephone Encounter (Signed)
Patient informed and agreed. Scheduled for appointment 3/20
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
## 2013-03-09 NOTE — Telephone Encounter (Signed)
Message copied by Theola Sequin on Mon Mar 09, 2013  1:00 PM ------      Message from: Ronna Polio A      Created: Thu Feb 26, 2013 12:08 PM       Labs were all fine. Can you ask pt if symptoms of lightheadedness have improved at all after stopping bystolic? ------

## 2013-03-09 NOTE — Telephone Encounter (Signed)
If it has been less than one month since her last xanax refill, then she will not be able to refill this medication.  If headaches have been persistent, we should see her back in a visit to discuss.

## 2013-03-12 ENCOUNTER — Other Ambulatory Visit: Payer: Self-pay | Admitting: Internal Medicine

## 2013-03-12 ENCOUNTER — Ambulatory Visit (INDEPENDENT_AMBULATORY_CARE_PROVIDER_SITE_OTHER): Payer: Medicare Other | Admitting: Internal Medicine

## 2013-03-12 ENCOUNTER — Encounter: Payer: Self-pay | Admitting: Internal Medicine

## 2013-03-12 VITALS — BP 158/84 | HR 84 | Temp 98.5°F | Wt 152.0 lb

## 2013-03-12 DIAGNOSIS — I10 Essential (primary) hypertension: Secondary | ICD-10-CM

## 2013-03-12 DIAGNOSIS — H409 Unspecified glaucoma: Secondary | ICD-10-CM

## 2013-03-12 DIAGNOSIS — F411 Generalized anxiety disorder: Secondary | ICD-10-CM

## 2013-03-12 DIAGNOSIS — F419 Anxiety disorder, unspecified: Secondary | ICD-10-CM

## 2013-03-12 MED ORDER — LISINOPRIL-HYDROCHLOROTHIAZIDE 20-12.5 MG PO TABS: 1.0000 | ORAL_TABLET | Freq: Every day | ORAL | Status: DC

## 2013-03-12 MED ORDER — ALPRAZOLAM 0.5 MG PO TABS: 0.5000 mg | ORAL_TABLET | Freq: Three times a day (TID) | ORAL | Status: DC | PRN

## 2013-03-12 MED ORDER — ESCITALOPRAM OXALATE 20 MG PO TABS: 20.0000 mg | ORAL_TABLET | Freq: Every day | ORAL | Status: DC

## 2013-03-12 NOTE — Assessment & Plan Note (Signed)
BP Readings from Last 3 Encounters:  03/12/13 158/84  02/26/13 146/70  02/25/13 126/70   BP elevated today. Pt was unable to tolerate bystolic because of bradycardia. Will increase lisinopril-hctz to 20-12.5. Plan to check Cr and K next week. Follow up 4 weeks.

## 2013-03-12 NOTE — Assessment & Plan Note (Signed)
Followed by opthalmology. Will request records.
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
## 2013-03-12 NOTE — Assessment & Plan Note (Signed)
Symptoms recently worsening. Will increase Lexapro to 20mg  daily. Continue alprazolam prn. Follow up 4 weeks.

## 2013-03-17 ENCOUNTER — Other Ambulatory Visit (INDEPENDENT_AMBULATORY_CARE_PROVIDER_SITE_OTHER): Payer: Medicare Other

## 2013-03-17 DIAGNOSIS — I10 Essential (primary) hypertension: Secondary | ICD-10-CM

## 2013-03-17 LAB — BASIC METABOLIC PANEL
CO2: 29 mEq/L (ref 19–32)
Calcium: 9.5 mg/dL (ref 8.4–10.5)
Creatinine, Ser: 0.9 mg/dL (ref 0.4–1.2)
GFR: 68.76 mL/min (ref >60.00)

## 2013-03-18 ENCOUNTER — Encounter: Payer: Self-pay | Admitting: *Deleted

## 2013-03-25 ENCOUNTER — Ambulatory Visit: Payer: Medicare Other | Admitting: Internal Medicine

## 2013-03-26 ENCOUNTER — Other Ambulatory Visit: Payer: Self-pay

## 2013-03-26 ENCOUNTER — Other Ambulatory Visit (INDEPENDENT_AMBULATORY_CARE_PROVIDER_SITE_OTHER): Payer: Medicare Other

## 2013-03-26 DIAGNOSIS — R002 Palpitations: Secondary | ICD-10-CM

## 2013-03-26 DIAGNOSIS — R079 Chest pain, unspecified: Secondary | ICD-10-CM

## 2013-04-02 ENCOUNTER — Ambulatory Visit (INDEPENDENT_AMBULATORY_CARE_PROVIDER_SITE_OTHER): Payer: Medicare Other | Admitting: Cardiovascular Disease

## 2013-04-02 ENCOUNTER — Encounter: Payer: Self-pay | Admitting: Cardiovascular Disease

## 2013-04-02 VITALS — BP 154/80 | HR 87 | Ht 63.5 in | Wt 152.4 lb

## 2013-04-02 DIAGNOSIS — I10 Essential (primary) hypertension: Secondary | ICD-10-CM

## 2013-04-02 MED ORDER — METOPROLOL SUCCINATE 12.5 MG HALF TABLET: 12.5000 mg | ORAL_TABLET | Freq: Two times a day (BID) | ORAL | Status: DC

## 2013-04-02 NOTE — Progress Notes (Signed)
[name] Date of Birth  03/17/1946       [facility] Office [address]  [address] [place], [place]  [address]   [place], [place]  [address] [phone]     [phone]   Fax  [phone]    Fax [phone]  Problem List: 1. Dizziness 2. Palpitatations  History of Present Illness:  Bellina is a 67 yo who is referred here today for dizziness and palpitations.   Her labs from Dr. Tilman Neat office were normal.  She has not felt well for the past 3-4 weeks.  She exercises on occasion - limited by knee pain.   She has been busy with her mother-in-law's death.  She needs to have bilateral knee replacements.  She was previously on Bystolic  10 mg a day but that was discontinued yesterday because of her symptoms. In addition, the lisinopril HCT was held and she did not take it today.  She has a headache today.  She has not noticed any improvement in here symptoms.  She has improved her diet over the past year and has lost 8 lbs.   April 02, 2013:  Mariea is feeling better.  Her HR and BP have been better.  No cp.   Current Outpatient Prescriptions on File Prior to Visit  Medication Sig Dispense Refill  . ALPRAZolam (XANAX) 0.5 MG tablet Take 1 tablet (0.5 mg total) by mouth 3 (three) times daily as needed for sleep or anxiety.  90 tablet  2  . escitalopram (LEXAPRO) 20 MG tablet Take 1 tablet (20 mg total) by mouth daily.  30 tablet  3  . estradiol (ESTRACE) 1 MG tablet TAKE 1/2 TABLET EVERY DAY  30 tablet  2  . latanoprost (XALATAN) 0.005 % ophthalmic solution       . lisinopril-hydrochlorothiazide (ZESTORETIC) 20-12.5 MG per tablet Take 1 tablet by mouth daily.  90 tablet  3  . meloxicam (MOBIC) 15 MG tablet Take 15 mg by mouth daily.       . Multiple Vitamin (MULTIVITAMIN) tablet Take 1 tablet by mouth daily.      Marland Kitchen nystatin (MYCOSTATIN/NYSTOP) 100000 UNIT/GM POWD APPLY TO AFFECTED AREA 3 TIMES DAILY  15 g  3  .  pantoprazole (PROTONIX) 40 MG tablet TAKE 1 TABLET (40 MG TOTAL) BY MOUTH DAILY.  30 tablet  1   No current facility-administered medications on file prior to visit.    Allergies  Allergen Reactions  . Codeine   . Erythromycin   . Penicillins Cross Reactors   . Prednisone     Headache, GI upset    Past Medical History  Diagnosis Date  . Anxiety   . Cystocele   . Incomplete bladder emptying   . Chronic cystitis   . Stress incontinence   . Depression   . UTI (lower urinary tract infection)   . Skin cancer   . Arthritis   . Gestational diabetes mellitus   . Endometriosis     Past Surgical History  Procedure Laterality Date  . Removal of first rib      bilaterally  . Prolapsed bladder      Repair Dr.Cope  . Abdominal hysterectomy    . Vein ligation and stripping    . Breast biopsy    . Tonsillectomy    . Appendectomy    . Breast lumpectomy    . Pubovaginal sling    . Anterior and posterior  vaginal repair      History  Smoking status  . Never Smoker   Smokeless tobacco  . Never Used    History  Alcohol Use  . Yes    Comment: rarely    Family History  Problem Relation Age of Onset  . Diabetes    . Hypertension    . Depression    . Cervical cancer      Reviw of Systems:  Reviewed in the HPI.  All other systems are negative.  Physical Exam: Blood pressure 154/80, pulse 87, height 5' 3.5" (1.613 m), weight 152 lb 6.4 oz (69.128 kg), SpO2 99.00%. General: Well developed, well nourished, in no acute distress.  Head: Normocephalic, atraumatic, sclera non-icteric, mucus membranes are moist,   Neck: Supple. Carotids are 2 + without bruits. No JVD   Lungs: Clear   Heart: RR, Normal S1, S2  Abdomen: Soft, non-tender, non-distended with normal bowel sounds.  Msk:  Strength and tone are normal   Extremities: No clubbing or cyanosis. No edema.  Distal pedal pulses are 2+ and equal    Neuro: CN II - XII intact.  Alert and oriented X 3.   Psych:   Normal   ECG: February 26, 2013:  Sinus brady at 50, otherwise normal  Assessment / Plan:
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
## 2013-04-02 NOTE — Patient Instructions (Addendum)
Your physician recommends that you schedule a follow-up appointment in: 3 months in the [place] office with EKG  Your physician has recommended you make the following change in your medication:   START METOPROLOL 12.5 MG TWICE DAILY 12 HOURS APART TO HELP LOWER YOUR BLOOD PRESSURE AND HEART RATE.

## 2013-04-12 ENCOUNTER — Other Ambulatory Visit: Payer: Self-pay | Admitting: Internal Medicine

## 2013-04-13 NOTE — Telephone Encounter (Signed)
Rx sent to pharmacy by escript  

## 2013-04-21 ENCOUNTER — Encounter: Payer: Self-pay | Admitting: Internal Medicine

## 2013-04-21 ENCOUNTER — Ambulatory Visit (INDEPENDENT_AMBULATORY_CARE_PROVIDER_SITE_OTHER): Payer: Medicare Other | Admitting: Internal Medicine

## 2013-04-21 VITALS — BP 160/88 | HR 88 | Temp 98.6°F | Wt 150.0 lb

## 2013-04-21 DIAGNOSIS — M129 Arthropathy, unspecified: Secondary | ICD-10-CM

## 2013-04-21 DIAGNOSIS — K219 Gastro-esophageal reflux disease without esophagitis: Secondary | ICD-10-CM

## 2013-04-21 DIAGNOSIS — M1990 Unspecified osteoarthritis, unspecified site: Secondary | ICD-10-CM | POA: Insufficient documentation

## 2013-04-21 DIAGNOSIS — F411 Generalized anxiety disorder: Secondary | ICD-10-CM

## 2013-04-21 DIAGNOSIS — F419 Anxiety disorder, unspecified: Secondary | ICD-10-CM

## 2013-04-21 DIAGNOSIS — I10 Essential (primary) hypertension: Secondary | ICD-10-CM

## 2013-04-21 LAB — COMPREHENSIVE METABOLIC PANEL
ALT: 16 U/L (ref 0–35)
AST: 16 U/L (ref 0–37)
Alkaline Phosphatase: 81 U/L (ref 39–117)
BUN: 21 mg/dL (ref 6–23)
Calcium: 9.9 mg/dL (ref 8.4–10.5)
Creatinine, Ser: 0.8 mg/dL (ref 0.4–1.2)
Potassium: 4.8 mEq/L (ref 3.5–5.1)

## 2013-04-21 LAB — CBC WITH DIFFERENTIAL/PLATELET
Basophils Absolute: 0.1 10*3/uL (ref 0.0–0.1)
Eosinophils Absolute: 0.3 10*3/uL (ref 0.0–0.7)
HCT: 39.3 % (ref 36.0–46.0)
Hemoglobin: 13.5 g/dL (ref 12.0–15.0)
Lymphs Abs: 1.6 10*3/uL (ref 0.7–4.0)
MCHC: 34.5 g/dL (ref 30.0–36.0)
Neutro Abs: 7.3 10*3/uL (ref 1.4–7.7)
Platelets: 302 10*3/uL (ref 150.0–400.0)
RDW: 13.1 % (ref 11.5–14.6)

## 2013-04-21 LAB — C-REACTIVE PROTEIN: CRP: <0.5 mg/dL (ref 0.5–20.0)

## 2013-04-21 LAB — SEDIMENTATION RATE: Sed Rate: 11 mm/hr (ref 0–22)

## 2013-04-21 MED ORDER — ESCITALOPRAM OXALATE 20 MG PO TABS: 20.0000 mg | ORAL_TABLET | Freq: Every day | ORAL | Status: DC

## 2013-04-21 MED ORDER — METOPROLOL SUCCINATE ER 25 MG PO TB24: 25.0000 mg | ORAL_TABLET | Freq: Every day | ORAL | Status: DC

## 2013-04-21 MED ORDER — PANTOPRAZOLE SODIUM 40 MG PO TBEC: DELAYED_RELEASE_TABLET | ORAL | Status: DC

## 2013-04-21 NOTE — Progress Notes (Signed)
Subjective:    Patient ID: Michele Meyer, female    DOB: 05-Jan-1946, 67 y.o.   MRN: [id number]  HPI 67 year old female with history of anxiety, hypertension, osteoarthritis presents for followup. In the interim since her last visit, she was evaluated by cardiology and had echo which was normal. She was started on metoprolol 12.5 mg twice daily. She reports no change in her blood pressure on this medication so she stopped taking the medication. At home, her blood pressure is typically less than 130/90. She denies any chest pain or palpitations. She is compliant with lisinopril hydrochlorothiazide.  She also notes she was recently seen by her orthopedic surgeon for left knee pain and swelling. She had arthrocentesis of her left knee with removal of significant amount of fluid per her report. She was told that she might have underlying rheumatoid arthritis based on findings. She reports that pain has been generally well controlled with meloxicam and after recent steroid injection.  Outpatient Encounter Prescriptions as of 04/21/2013  Medication Sig Dispense Refill  . ALPRAZolam (XANAX) 0.5 MG tablet Take 1 tablet (0.5 mg total) by mouth 3 (three) times daily as needed for sleep or anxiety.  90 tablet  2  . escitalopram (LEXAPRO) 20 MG tablet Take 1 tablet (20 mg total) by mouth daily.  30 tablet  6  . estradiol (ESTRACE) 1 MG tablet TAKE 1/2 TABLET EVERY DAY  30 tablet  2  . latanoprost (XALATAN) 0.005 % ophthalmic solution       . lisinopril-hydrochlorothiazide (ZESTORETIC) 20-12.5 MG per tablet Take 1 tablet by mouth daily.  90 tablet  3  . meloxicam (MOBIC) 15 MG tablet TAKE 1 TABLET BY MOUTH EVERY DAY  30 tablet  1  . metoprolol succinate (TOPROL-XL) 25 MG 24 hr tablet Take 1 tablet (25 mg total) by mouth daily.  30 tablet  6  . Multiple Vitamin (MULTIVITAMIN) tablet Take 1 tablet by mouth daily.      Marland Kitchen nystatin (MYCOSTATIN/NYSTOP) 100000 UNIT/GM POWD APPLY TO AFFECTED AREA 3 TIMES DAILY  15 g  3   . pantoprazole (PROTONIX) 40 MG tablet TAKE 1 TABLET (40 MG TOTAL) BY MOUTH DAILY.  30 tablet  6   No facility-administered encounter medications on file as of 04/21/2013.   BP 160/88  Pulse 88  Temp(Src) 98.6 F (37 C) (Oral)  Wt 150 lb (68.04 kg)  BMI 26.15 kg/m2  SpO2 99%  Review of Systems  Constitutional: Negative for fever, chills, appetite change, fatigue and unexpected weight change.  HENT: Negative for ear pain, congestion, sore throat, trouble swallowing, neck pain, voice change and sinus pressure.   Eyes: Negative for visual disturbance.  Respiratory: Negative for cough, shortness of breath, wheezing and stridor.   Cardiovascular: Negative for chest pain, palpitations and leg swelling.  Gastrointestinal: Negative for nausea, vomiting, abdominal pain, diarrhea, constipation, blood in stool, abdominal distention and anal bleeding.  Genitourinary: Negative for dysuria and flank pain.  Musculoskeletal: Positive for arthralgias. Negative for myalgias and gait problem.  Skin: Negative for color change and rash.  Neurological: Negative for dizziness and headaches.  Hematological: Negative for adenopathy. Does not bruise/bleed easily.  Psychiatric/Behavioral: Negative for suicidal ideas, sleep disturbance and dysphoric mood. The patient is nervous/anxious.        Objective:   Physical Exam  Constitutional: She is oriented to person, place, and time. She appears well-developed and well-nourished. No distress.  HENT:  Head: Normocephalic and atraumatic.  Right Ear: External ear normal.  Left Ear: External  ear normal.  Nose: Nose normal.  Mouth/Throat: Oropharynx is clear and moist. No oropharyngeal exudate.  Eyes: Conjunctivae are normal. Pupils are equal, round, and reactive to light. Right eye exhibits no discharge. Left eye exhibits no discharge. No scleral icterus.  Neck: Normal range of motion. Neck supple. No tracheal deviation present. No thyromegaly present.   Cardiovascular: Normal rate, regular rhythm, normal heart sounds and intact distal pulses.  Exam reveals no gallop and no friction rub.   No murmur heard. Pulmonary/Chest: Effort normal and breath sounds normal. No respiratory distress. She has no wheezes. She has no rales. She exhibits no tenderness.  Musculoskeletal: She exhibits no edema and no tenderness.       Left knee: She exhibits normal range of motion, no swelling and no effusion.  Lymphadenopathy:    She has no cervical adenopathy.  Neurological: She is alert and oriented to person, place, and time. No cranial nerve deficit. She exhibits normal muscle tone. Coordination normal.  Skin: Skin is warm and dry. No rash noted. She is not diaphoretic. No erythema. No pallor.  Psychiatric: She has a normal mood and affect. Her behavior is normal. Judgment and thought content normal.          Assessment & Plan:

## 2013-04-21 NOTE — Assessment & Plan Note (Signed)
BP Readings from Last 3 Encounters:  04/21/13 160/88  04/02/13 154/80  03/12/13 158/84   BP slightly elevated, however pt has stopped Metoprolol. For convenience, will have her change to Metoprolol XL 25mg  po daily.  Monitor BP at home. Call if BP >140/90 consistently. Reviewed report on recent ECHO which was normal. Follow up 4 weeks.

## 2013-04-21 NOTE — Assessment & Plan Note (Signed)
Chronic arthritis pain, worse in left knee. Reports recent left knee effusion. Will request reports on this from ortho. ESR normal today. Will also send ANA, RF to eval for rheumatoid arthritis.

## 2013-04-22 ENCOUNTER — Other Ambulatory Visit: Payer: Self-pay | Admitting: Internal Medicine

## 2013-04-22 DIAGNOSIS — M1990 Unspecified osteoarthritis, unspecified site: Secondary | ICD-10-CM

## 2013-05-08 ENCOUNTER — Ambulatory Visit (INDEPENDENT_AMBULATORY_CARE_PROVIDER_SITE_OTHER): Payer: Medicare Other

## 2013-05-08 ENCOUNTER — Telehealth: Payer: Self-pay | Admitting: Internal Medicine

## 2013-05-08 VITALS — BP 165/71 | HR 61 | Ht 62.5 in | Wt 155.0 lb

## 2013-05-08 DIAGNOSIS — I498 Other specified cardiac arrhythmias: Secondary | ICD-10-CM

## 2013-05-08 DIAGNOSIS — I10 Essential (primary) hypertension: Secondary | ICD-10-CM

## 2013-05-08 DIAGNOSIS — R001 Bradycardia, unspecified: Secondary | ICD-10-CM

## 2013-05-08 MED ORDER — METOPROLOL SUCCINATE ER 25 MG PO TB24: 12.5000 mg | ORAL_TABLET | Freq: Two times a day (BID) | ORAL | Status: DC

## 2013-05-08 NOTE — Telephone Encounter (Signed)
Caller Name: Martie  Phone: [phone]  Patient: Michele, Meyer  Gender: Female  DOB: 1946/08/20  Age: 67 Years  PCP: Ronna Polio (Adults only)   Does the office need to follow up with this patient?: No  RN Note:  states unable to read heart rate on home monitor (too low but has been 49-52), headache, feels weak, requires sleeping during the day, ED advised due to low heart rate.   Reason For Call & Symptoms: emergent call RE low BP: checked 132/62 HR 49. States lisinopril 20/12.5 daily was increased, metroprolol 25 was added. at last office viist.  Reviewed Health History In EMR: Yes  Reviewed Medications In EMR: Yes  Reviewed Allergies In EMR: Yes  Reviewed Surgeries / Procedures: Yes  Date of Onset of Symptoms: 05/06/2013  Guideline(s) Used:  Heart Rate and Heartbeat Questions  Disposition Per Guideline:  Go to ED Now  Reason For Disposition Reached:  Heart beating very slowly (e.g., < 50 / minute)   Advice Given:  Call Back If:  You become worse.  Patient Will Follow Care Advice:  YES

## 2013-05-08 NOTE — Progress Notes (Signed)
Pt here with concerns re:HR and BP Checks BP and HR at home and was getting readings as follows: 125/57, hr=67 115/65, 62 105/53, 70 106/58, 62 109/56, 52 132/62, 49 116/54, "heart rate too low to read"  C/O h/a, feeling "tired", also c/o pain in middle of back x 3-4 days Feels "not myself" Recently dx with RA Being tx by opthalmologist for "pressure behind eyes"  Only change in meds was by Dr. Dan Humphreys who recently increased Toprol to 25 mg daily  I checked home BP cuff=165/71, HR=60 and compared to our manual cuf=152/60, HR=61  I will discuss with Dr. Elease Hashimoto   I discussed with Dr. Elease Hashimoto who suggests "change metoprolol to 12.5 mg BID to see if this helps her symptoms.  Have her f/u with me if nob change in symptoms" V.O Dr. Oda Kilts, RN

## 2013-05-08 NOTE — Telephone Encounter (Signed)
Called and spoke with patient, she is not going to the ED. She is going to see her heart doctor, she was on the way when I called.

## 2013-05-08 NOTE — Patient Instructions (Addendum)
Change metoprolol to 12.5 mg twice daily  Call us if no improvement in a few days

## 2013-05-26 ENCOUNTER — Ambulatory Visit: Payer: Medicare Other | Admitting: Internal Medicine

## 2013-06-09 ENCOUNTER — Ambulatory Visit: Payer: Medicare Other | Admitting: Internal Medicine

## 2013-06-12 ENCOUNTER — Telehealth: Payer: Self-pay | Admitting: *Deleted

## 2013-06-12 ENCOUNTER — Encounter: Payer: Self-pay | Admitting: Internal Medicine

## 2013-06-12 ENCOUNTER — Ambulatory Visit (INDEPENDENT_AMBULATORY_CARE_PROVIDER_SITE_OTHER): Payer: Medicare Other | Admitting: Internal Medicine

## 2013-06-12 VITALS — BP 170/80 | HR 74 | Temp 98.6°F | Wt 153.0 lb

## 2013-06-12 DIAGNOSIS — M1990 Unspecified osteoarthritis, unspecified site: Secondary | ICD-10-CM

## 2013-06-12 DIAGNOSIS — F419 Anxiety disorder, unspecified: Secondary | ICD-10-CM

## 2013-06-12 DIAGNOSIS — R300 Dysuria: Secondary | ICD-10-CM | POA: Insufficient documentation

## 2013-06-12 DIAGNOSIS — F411 Generalized anxiety disorder: Secondary | ICD-10-CM

## 2013-06-12 DIAGNOSIS — I10 Essential (primary) hypertension: Secondary | ICD-10-CM

## 2013-06-12 LAB — POCT URINALYSIS DIPSTICK
Leukocytes, UA: NEGATIVE
Nitrite, UA: NEGATIVE
Protein, UA: NEGATIVE
Urobilinogen, UA: 1
pH, UA: 6.5

## 2013-06-12 MED ORDER — ALPRAZOLAM 0.5 MG PO TABS: 0.5000 mg | ORAL_TABLET | Freq: Three times a day (TID) | ORAL | Status: DC | PRN

## 2013-06-12 MED ORDER — METOPROLOL SUCCINATE ER 25 MG PO TB24: 12.5000 mg | ORAL_TABLET | Freq: Two times a day (BID) | ORAL | Status: DC

## 2013-06-12 MED ORDER — LOSARTAN POTASSIUM-HCTZ 50-12.5 MG PO TABS: 1.0000 | ORAL_TABLET | Freq: Every day | ORAL | Status: DC

## 2013-06-12 MED ORDER — LISINOPRIL-HYDROCHLOROTHIAZIDE 20-12.5 MG PO TABS: 1.0000 | ORAL_TABLET | Freq: Every day | ORAL | Status: DC

## 2013-06-12 NOTE — Assessment & Plan Note (Signed)
Mild dysuria noted. Urinalysis shows trace blood but no other signs of infection. Will send for culture. Pt will call if new symptoms develop such as urinary urgency, frequency. If urine culture is negative, then pt will need further evaluation of hematuria.

## 2013-06-12 NOTE — Progress Notes (Signed)
Subjective:    Patient ID: Michele Meyer, female    DOB: [birth year], 67 y.o.   MRN: [id number]  HPI 67 year old female with history of hypertension, anxiety, osteoarthritis presents for followup. In regards to hypertension, she reports blood pressure has been quite variable, ranging from 110s over 60s to 170/90. She denies any chest pain, palpitation, headache. She is compliant with medications including metoprolol and lisinopril.  She notes ongoing issues with osteoarthritis in both of her knees. She has extreme pain and swelling in her knees. She was seen by rheumatology and by orthopedics. She was given steroid injection with minimal improvement. She reports that she's been told she'll need me replacement but is holding off because of ongoing responsibilities at home. She continues on meloxicam for pain with minimal improvement.  In regards to anxiety, she reports symptoms have been well-controlled with Lexapro and intermittent use of alprazolam.  She is concerned about mild dysuria. She denies any urinary frequency, urgency, fever, chills, flank pain. Slight burning pain has been noted over the last couple of days. No vaginal discharge or pelvic pain. Not taking any medication for this.  Outpatient Prescriptions Prior to Visit  Medication Sig Dispense Refill  . escitalopram (LEXAPRO) 20 MG tablet Take 1 tablet (20 mg total) by mouth daily.  30 tablet  6  . latanoprost (XALATAN) 0.005 % ophthalmic solution       . meloxicam (MOBIC) 15 MG tablet TAKE 1 TABLET BY MOUTH EVERY DAY  30 tablet  1  . Multiple Vitamin (MULTIVITAMIN) tablet Take 1 tablet by mouth daily.      . pantoprazole (PROTONIX) 40 MG tablet TAKE 1 TABLET (40 MG TOTAL) BY MOUTH DAILY.  30 tablet  6  . ALPRAZolam (XANAX) 0.5 MG tablet Take 1 tablet (0.5 mg total) by mouth 3 (three) times daily as needed for sleep or anxiety.  90 tablet  2  . metoprolol succinate (TOPROL-XL) 25 MG 24 hr tablet Take 0.5 tablets (12.5 mg total) by  mouth 2 (two) times daily.  30 tablet  6  . lisinopril-hydrochlorothiazide (ZESTORETIC) 20-12.5 MG per tablet Take 1 tablet by mouth daily.  90 tablet  3   No facility-administered medications prior to visit.    No facility-administered encounter medications on file as of 06/12/2013.   BP 170/80  Pulse 74  Temp(Src) 98.6 F (37 C) (Oral)  Wt 153 lb (69.4 kg)  BMI 27.52 kg/m2  SpO2 97%  Review of Systems  Constitutional: Negative for fever, chills, appetite change, fatigue and unexpected weight change.  HENT: Negative for ear pain, congestion, sore throat, trouble swallowing, neck pain, voice change and sinus pressure.   Eyes: Negative for visual disturbance.  Respiratory: Negative for cough, shortness of breath, wheezing and stridor.   Cardiovascular: Negative for chest pain, palpitations and leg swelling.  Gastrointestinal: Negative for nausea, vomiting, abdominal pain, diarrhea, constipation, blood in stool, abdominal distention and anal bleeding.  Genitourinary: Negative for dysuria and flank pain.  Musculoskeletal: Positive for joint swelling and arthralgias. Negative for myalgias and gait problem.  Skin: Negative for color change and rash.  Neurological: Negative for dizziness and headaches.  Hematological: Negative for adenopathy. Does not bruise/bleed easily.  Psychiatric/Behavioral: Negative for suicidal ideas, sleep disturbance and dysphoric mood. The patient is nervous/anxious.        Objective:   Physical Exam  Constitutional: She is oriented to person, place, and time. She appears well-developed and well-nourished. No distress.  HENT:  Head: Normocephalic and atraumatic.  Right Ear:  External ear normal.  Left Ear: External ear normal.  Nose: Nose normal.  Mouth/Throat: Oropharynx is clear and moist. No oropharyngeal exudate.  Eyes: Conjunctivae are normal. Pupils are equal, round, and reactive to light. Right eye exhibits no discharge. Left eye exhibits no  discharge. No scleral icterus.  Neck: Normal range of motion. Neck supple. No tracheal deviation present. No thyromegaly present.  Cardiovascular: Normal rate, regular rhythm, normal heart sounds and intact distal pulses.  Exam reveals no gallop and no friction rub.   No murmur heard. Pulmonary/Chest: Effort normal and breath sounds normal. No accessory muscle usage. Not tachypneic. No respiratory distress. She has no decreased breath sounds. She has no wheezes. She has no rhonchi. She has no rales. She exhibits no tenderness.  Musculoskeletal: She exhibits no edema and no tenderness.       Right knee: She exhibits decreased range of motion and swelling.       Left knee: She exhibits decreased range of motion and swelling.  Lymphadenopathy:    She has no cervical adenopathy.  Neurological: She is alert and oriented to person, place, and time. No cranial nerve deficit. She exhibits normal muscle tone. Coordination normal.  Skin: Skin is warm and dry. No rash noted. She is not diaphoretic. No erythema. No pallor.  Psychiatric: She has a normal mood and affect. Her behavior is normal. Judgment and thought content normal.          Assessment & Plan:

## 2013-06-12 NOTE — Assessment & Plan Note (Signed)
BP Readings from Last 3 Encounters:  06/12/13 170/80  05/08/13 165/71  04/21/13 160/88   BP elevated in clinic today.Pt reports BP variable at home. Having cough with lisinopril. Will change to Losartan-HCTZ. Pt has follow up with cardiology next month for BP recheck and will follow here in 3 months and prn.

## 2013-06-12 NOTE — Telephone Encounter (Signed)
Yes please

## 2013-06-12 NOTE — Assessment & Plan Note (Signed)
Symptoms well controlled with alprazolam. Will continue.

## 2013-06-12 NOTE — Telephone Encounter (Signed)
Would you like a culture done?  

## 2013-06-12 NOTE — Assessment & Plan Note (Signed)
Persistent pain bilateral knees. No improvement with steroid injection. Encouraged her to follow up with orthopedics re possible knee replacement. Continue Meloxicam.

## 2013-06-15 ENCOUNTER — Telehealth: Payer: Self-pay | Admitting: *Deleted

## 2013-06-15 ENCOUNTER — Other Ambulatory Visit (INDEPENDENT_AMBULATORY_CARE_PROVIDER_SITE_OTHER): Payer: Medicare Other

## 2013-06-15 DIAGNOSIS — R319 Hematuria, unspecified: Secondary | ICD-10-CM

## 2013-06-15 LAB — POCT URINALYSIS DIPSTICK
Bilirubin, UA: NEGATIVE
Blood, UA: NEGATIVE
Ketones, UA: NEGATIVE
Leukocytes, UA: NEGATIVE
Protein, UA: NEGATIVE
Spec Grav, UA: 1.025
pH, UA: 5.5

## 2013-06-15 NOTE — Telephone Encounter (Signed)
Pt came in to give a urine sample and she wanted me to let you know that she has a protruding bladder and if this can be causing the blood in the urine, also that she is going to get it corrected soon

## 2013-06-15 NOTE — Telephone Encounter (Signed)
Would you like a urine culture?  

## 2013-06-15 NOTE — Telephone Encounter (Signed)
No need to culture

## 2013-06-15 NOTE — Telephone Encounter (Signed)
Fwd to Dr. Walker 

## 2013-07-06 ENCOUNTER — Other Ambulatory Visit: Payer: Self-pay | Admitting: Internal Medicine

## 2013-08-04 ENCOUNTER — Other Ambulatory Visit: Payer: Self-pay | Admitting: Internal Medicine

## 2013-09-02 ENCOUNTER — Other Ambulatory Visit: Payer: Self-pay | Admitting: *Deleted

## 2013-09-02 ENCOUNTER — Telehealth: Payer: Self-pay | Admitting: *Deleted

## 2013-09-02 DIAGNOSIS — Z0000 Encounter for general adult medical examination without abnormal findings: Secondary | ICD-10-CM

## 2013-09-02 NOTE — Telephone Encounter (Signed)
Pt is coming in for labs tomorrow, what labs and dx?

## 2013-09-02 NOTE — Telephone Encounter (Signed)
Is she coming for labs pre-physical? If yes, then CMP, CBC, lipids, TSH, urine microalbumin V70.0

## 2013-09-03 ENCOUNTER — Other Ambulatory Visit (INDEPENDENT_AMBULATORY_CARE_PROVIDER_SITE_OTHER): Payer: Medicare Other

## 2013-09-03 ENCOUNTER — Encounter: Payer: Self-pay | Admitting: *Deleted

## 2013-09-03 DIAGNOSIS — Z0000 Encounter for general adult medical examination without abnormal findings: Secondary | ICD-10-CM

## 2013-09-03 DIAGNOSIS — E785 Hyperlipidemia, unspecified: Secondary | ICD-10-CM

## 2013-09-03 DIAGNOSIS — I10 Essential (primary) hypertension: Secondary | ICD-10-CM

## 2013-09-03 LAB — CBC WITH DIFFERENTIAL/PLATELET
Basophils Absolute: 0 10*3/uL (ref 0.0–0.1)
Eosinophils Relative: 10.3 % — ABNORMAL HIGH (ref 0.0–5.0)
Hemoglobin: 12.4 g/dL (ref 12.0–15.0)
Lymphocytes Relative: 19.9 % (ref 12.0–46.0)
Monocytes Relative: 9.1 % (ref 3.0–12.0)
Platelets: 240 10*3/uL (ref 150.0–400.0)
RDW: 13 % (ref 11.5–14.6)
WBC: 6.1 10*3/uL (ref 4.5–10.5)

## 2013-09-03 LAB — COMPREHENSIVE METABOLIC PANEL
ALT: 17 U/L (ref 0–35)
CO2: 30 mEq/L (ref 19–32)
Calcium: 9.7 mg/dL (ref 8.4–10.5)
Chloride: 101 mEq/L (ref 96–112)
Creatinine, Ser: 0.8 mg/dL (ref 0.4–1.2)
GFR: 80.25 mL/min (ref >60.00)
Glucose, Bld: 109 mg/dL — ABNORMAL HIGH (ref 70–99)
Total Bilirubin: 0.9 mg/dL (ref 0.3–1.2)

## 2013-09-03 LAB — LIPID PANEL
Cholesterol: 249 mg/dL — ABNORMAL HIGH (ref 0–200)
HDL: 71.2 mg/dL (ref >39.00)
Total CHOL/HDL Ratio: 3
VLDL: 17.2 mg/dL (ref 0.0–40.0)

## 2013-09-03 LAB — LDL CHOLESTEROL, DIRECT: Direct LDL: 167.1 mg/dL

## 2013-09-08 ENCOUNTER — Encounter: Payer: Self-pay | Admitting: *Deleted

## 2013-09-08 ENCOUNTER — Ambulatory Visit: Payer: Medicare Other | Admitting: Internal Medicine

## 2013-09-09 ENCOUNTER — Encounter: Payer: Self-pay | Admitting: Internal Medicine

## 2013-09-09 ENCOUNTER — Ambulatory Visit (INDEPENDENT_AMBULATORY_CARE_PROVIDER_SITE_OTHER): Payer: Medicare Other | Admitting: Internal Medicine

## 2013-09-09 VITALS — BP 158/80 | HR 69 | Temp 98.5°F | Wt 158.0 lb

## 2013-09-09 DIAGNOSIS — M1990 Unspecified osteoarthritis, unspecified site: Secondary | ICD-10-CM

## 2013-09-09 DIAGNOSIS — I10 Essential (primary) hypertension: Secondary | ICD-10-CM

## 2013-09-09 DIAGNOSIS — F411 Generalized anxiety disorder: Secondary | ICD-10-CM

## 2013-09-09 DIAGNOSIS — Z23 Encounter for immunization: Secondary | ICD-10-CM

## 2013-09-09 DIAGNOSIS — E785 Hyperlipidemia, unspecified: Secondary | ICD-10-CM

## 2013-09-09 DIAGNOSIS — F419 Anxiety disorder, unspecified: Secondary | ICD-10-CM

## 2013-09-09 MED ORDER — ALPRAZOLAM 0.5 MG PO TABS: 0.5000 mg | ORAL_TABLET | Freq: Three times a day (TID) | ORAL | Status: DC | PRN

## 2013-09-09 NOTE — Progress Notes (Signed)
Subjective:    Patient ID: Michele Meyer, female    DOB: 11/29/1946, 67 y.o.   MRN: [id number]  HPI 67YO female with history of anxiety, depression, hypertension, chronic knee pain secondary osteoarthritis presents for followup. She reports that symptoms of anxiety have been well-controlled with use of Lexapro and alprazolam. She continues to be the primary caregiver for several of her relatives to have been ill and her son who suffers from chronic anxiety. She opted to delay upcoming knee replacement surgery because of responsibilities caring for family members. She continues to have severe pain in both of her knees which limits her ability to function and walk. She is no longer getting regular exercise.  In regards to hypertension, she reports that blood pressure at home is typically 130s over 80s. She denies any chest pain, palpitations, headache. She is compliant with medications.  Her recent labs showed elevated cholesterol with LDL of 167. She has never taken medication for cholesterol management. She does have a family history of heart disease.  Outpatient Encounter Prescriptions as of 09/09/2013  Medication Sig Dispense Refill  . ALPRAZolam (XANAX) 0.5 MG tablet Take 1 tablet (0.5 mg total) by mouth 3 (three) times daily as needed for sleep or anxiety.  90 tablet  2  . escitalopram (LEXAPRO) 20 MG tablet Take 20 mg by mouth daily. Taking 1/2 tablet once a day      . latanoprost (XALATAN) 0.005 % ophthalmic solution       . losartan-hydrochlorothiazide (HYZAAR) 50-12.5 MG per tablet Take 1 tablet by mouth daily.  90 tablet  3  . meloxicam (MOBIC) 15 MG tablet TAKE 1 TABLET BY MOUTH EVERY DAY  30 tablet  1  . metoprolol succinate (TOPROL-XL) 25 MG 24 hr tablet Take 0.5 tablets (12.5 mg total) by mouth 2 (two) times daily.  90 tablet  3  . pantoprazole (PROTONIX) 40 MG tablet TAKE 1 TABLET (40 MG TOTAL) BY MOUTH DAILY.  30 tablet  6  . HYDROcodone-acetaminophen (NORCO/VICODIN) 5-325 MG per  tablet       . Multiple Vitamin (MULTIVITAMIN) tablet Take 1 tablet by mouth daily.      Marland Kitchen nystatin (MYCOSTATIN/NYSTOP) 100000 UNIT/GM POWD APPLY TO AFFECTED AREA 3 TIMES DAILY  15 g  3   No facility-administered encounter medications on file as of 09/09/2013.   BP 158/80  Pulse 69  Temp(Src) 98.5 F (36.9 C) (Oral)  Wt 158 lb (71.668 kg)  BMI 28.42 kg/m2  SpO2 99%  Review of Systems  Constitutional: Negative for fever, chills, appetite change, fatigue and unexpected weight change.  HENT: Negative for ear pain, congestion, sore throat, trouble swallowing, neck pain, voice change and sinus pressure.   Eyes: Negative for visual disturbance.  Respiratory: Negative for cough, shortness of breath, wheezing and stridor.   Cardiovascular: Negative for chest pain, palpitations and leg swelling.  Gastrointestinal: Negative for nausea, vomiting, abdominal pain, diarrhea, constipation, blood in stool, abdominal distention and anal bleeding.  Genitourinary: Negative for dysuria and flank pain.  Musculoskeletal: Positive for arthralgias. Negative for myalgias and gait problem.  Skin: Negative for color change and rash.  Neurological: Negative for dizziness and headaches.  Hematological: Negative for adenopathy. Does not bruise/bleed easily.  Psychiatric/Behavioral: Negative for suicidal ideas, sleep disturbance and dysphoric mood. The patient is nervous/anxious.        Objective:   Physical Exam  Constitutional: She is oriented to person, place, and time. She appears well-developed and well-nourished. No distress.  HENT:  Head: Normocephalic  and atraumatic.  Right Ear: External ear normal.  Left Ear: External ear normal.  Nose: Nose normal.  Mouth/Throat: Oropharynx is clear and moist. No oropharyngeal exudate.  Eyes: Conjunctivae are normal. Pupils are equal, round, and reactive to light. Right eye exhibits no discharge. Left eye exhibits no discharge. No scleral icterus.  Neck: Normal  range of motion. Neck supple. No tracheal deviation present. No thyromegaly present.  Cardiovascular: Normal rate, regular rhythm, normal heart sounds and intact distal pulses.  Exam reveals no gallop and no friction rub.   No murmur heard. Pulmonary/Chest: Effort normal and breath sounds normal. No accessory muscle usage. Not tachypneic. No respiratory distress. She has no decreased breath sounds. She has no wheezes. She has no rhonchi. She has no rales. She exhibits no tenderness.  Musculoskeletal: Normal range of motion. She exhibits no edema and no tenderness.  Lymphadenopathy:    She has no cervical adenopathy.  Neurological: She is alert and oriented to person, place, and time. No cranial nerve deficit. She exhibits normal muscle tone. Coordination normal.  Skin: Skin is warm and dry. No rash noted. She is not diaphoretic. No erythema. No pallor.  Psychiatric: She has a normal mood and affect. Her behavior is normal. Judgment and thought content normal.          Assessment & Plan:

## 2013-09-09 NOTE — Assessment & Plan Note (Signed)
Persistent pain bilateral knees. Pt canceled scheduled knee replacement. Encouraged her to reconsider as significant knee pain has limited her physical activity and is detrimental to her health.

## 2013-09-09 NOTE — Assessment & Plan Note (Signed)
BP Readings from Last 3 Encounters:  09/09/13 158/80  06/12/13 170/80  05/08/13 165/71   BP slightly elevated today, however pt reports has been well controlled at home. Will continue current medications. Recent renal function was normal.

## 2013-09-09 NOTE — Addendum Note (Signed)
Addended by: Theola Sequin on: 09/09/2013 03:19 PM   Modules accepted: Orders

## 2013-09-09 NOTE — Patient Instructions (Signed)
Start Mediterranean style diet. Set goal of exercise 3x per week.  Plan repeat cholesterol levels in 3 months.

## 2013-09-09 NOTE — Assessment & Plan Note (Signed)
Lipids noted to be elevated with LDL 167. Discussed new cholesterol guidelines recommending Mediterranean style diet and exercise with a goal of 40 minutes 3 times per week. Calculated ASCVD 10 year risk heart disease 6%. Recommended following healthy diet and increasing exercise with plan to repeat lipids in 3 months.

## 2013-09-09 NOTE — Assessment & Plan Note (Signed)
Symptoms are well-controlled with Lexapro and alprazolam as needed. Will continue.

## 2013-09-21 ENCOUNTER — Inpatient Hospital Stay: Admit: 2013-09-21 | Payer: Self-pay | Admitting: Orthopedic Surgery

## 2013-09-21 ENCOUNTER — Telehealth: Payer: Self-pay | Admitting: Internal Medicine

## 2013-09-21 SURGERY — ARTHROPLASTY, KNEE, TOTAL
Anesthesia: Choice | Site: Knee | Laterality: Left

## 2013-09-21 NOTE — Telephone Encounter (Signed)
The patient is wanting a stool kit to check for H-Pylori . She stated she is having the same symptoms as before.

## 2013-09-21 NOTE — Telephone Encounter (Signed)
Can we just do a breath test? This would be faster, easier. She will also need a follow up visit.

## 2013-09-21 NOTE — Telephone Encounter (Signed)
Fwd to Dr. Walker 

## 2013-09-22 ENCOUNTER — Other Ambulatory Visit: Payer: Medicare Other

## 2013-09-22 ENCOUNTER — Telehealth: Payer: Self-pay | Admitting: *Deleted

## 2013-09-22 DIAGNOSIS — R109 Unspecified abdominal pain: Secondary | ICD-10-CM

## 2013-09-22 NOTE — Telephone Encounter (Signed)
What dx for the breath test?

## 2013-09-22 NOTE — Telephone Encounter (Signed)
Abdominal pain

## 2013-09-22 NOTE — Telephone Encounter (Signed)
Patient came in for the breath test, will schedule follow up after results return.

## 2013-09-23 LAB — H. PYLORI BREATH TEST: H. pylori Breath Test: NEGATIVE

## 2013-09-24 ENCOUNTER — Encounter: Payer: Self-pay | Admitting: Internal Medicine

## 2013-09-24 ENCOUNTER — Telehealth: Payer: Self-pay | Admitting: Internal Medicine

## 2013-09-24 ENCOUNTER — Encounter: Payer: Self-pay | Admitting: *Deleted

## 2013-09-24 NOTE — Telephone Encounter (Signed)
Pt was calling and wanting a call back from a nurse. She left her voicemail on the scheduling line instead of nurses line.

## 2013-09-25 ENCOUNTER — Encounter: Payer: Self-pay | Admitting: Internal Medicine

## 2013-09-25 ENCOUNTER — Ambulatory Visit (INDEPENDENT_AMBULATORY_CARE_PROVIDER_SITE_OTHER): Payer: Medicare Other | Admitting: Internal Medicine

## 2013-09-25 VITALS — BP 160/80 | HR 87 | Temp 98.4°F | Wt 157.0 lb

## 2013-09-25 DIAGNOSIS — R6881 Early satiety: Secondary | ICD-10-CM | POA: Insufficient documentation

## 2013-09-25 DIAGNOSIS — F411 Generalized anxiety disorder: Secondary | ICD-10-CM

## 2013-09-25 DIAGNOSIS — R1013 Epigastric pain: Secondary | ICD-10-CM

## 2013-09-25 DIAGNOSIS — N644 Mastodynia: Secondary | ICD-10-CM

## 2013-09-25 DIAGNOSIS — F419 Anxiety disorder, unspecified: Secondary | ICD-10-CM

## 2013-09-25 NOTE — Assessment & Plan Note (Signed)
Symptoms of persistent epigastric pain despite use of pantoprazole. H. pylori breath test negative. Will change to Dexilant. Will set up GI evaluation for upper endoscopy.

## 2013-09-25 NOTE — Progress Notes (Signed)
Subjective:    Patient ID: Michele Meyer, female    DOB: 1946/01/21, 67 y.o.   MRN: [id number]  HPI 67 year old female with history of anxiety, hypertension presents for acute visit complaining of epigastric pain. This is been ongoing for several years. In the past, she underwent endoscopy which was normal. However, this was many years ago. Over the last several months she has had worsening epigastric discomfort. She also notes early satiety. She denies nausea, vomiting, change in bowel habits, blood in her stool. No weight loss. She has been taking Pantoprazole with no improvement.  She also notes worsening anxiety. She is frustrated that her son lost his job after our office decided to discontinue prescribing him alprazolam. She feels that we should continue to write him the medication. She feels that he has been truthful with [place]. She herself acknowledges anxiety and symptoms of depressed mood with frequent tearfulness. She denies suicidal ideation. She does note that her father committed suicide.  Outpatient Encounter Prescriptions as of 09/25/2013  Medication Sig Dispense Refill  . ALPRAZolam (XANAX) 0.5 MG tablet Take 1 tablet (0.5 mg total) by mouth 3 (three) times daily as needed for sleep or anxiety.  90 tablet  2  . escitalopram (LEXAPRO) 20 MG tablet Take 20 mg by mouth daily. Taking 1/2 tablet once a day      . HYDROcodone-acetaminophen (NORCO/VICODIN) 5-325 MG per tablet       . latanoprost (XALATAN) 0.005 % ophthalmic solution       . losartan-hydrochlorothiazide (HYZAAR) 50-12.5 MG per tablet Take 1 tablet by mouth daily.  90 tablet  3  . meloxicam (MOBIC) 15 MG tablet TAKE 1 TABLET BY MOUTH EVERY DAY  30 tablet  1  . metoprolol succinate (TOPROL-XL) 25 MG 24 hr tablet Take 0.5 tablets (12.5 mg total) by mouth 2 (two) times daily.  90 tablet  3  . Multiple Vitamin (MULTIVITAMIN) tablet Take 1 tablet by mouth daily.      Marland Kitchen nystatin (MYCOSTATIN/NYSTOP) 100000 UNIT/GM POWD APPLY TO  AFFECTED AREA 3 TIMES DAILY  15 g  3  . pantoprazole (PROTONIX) 40 MG tablet TAKE 1 TABLET (40 MG TOTAL) BY MOUTH DAILY.  30 tablet  6   No facility-administered encounter medications on file as of 09/25/2013.   BP 160/80  Pulse 87  Temp(Src) 98.4 F (36.9 C) (Oral)  Wt 157 lb (71.215 kg)  BMI 28.24 kg/m2  SpO2 96%  Review of Systems  Constitutional: Negative for fever, chills, appetite change, fatigue and unexpected weight change.  HENT: Negative for ear pain, congestion, sore throat, trouble swallowing, neck pain, voice change and sinus pressure.   Eyes: Negative for visual disturbance.  Respiratory: Negative for cough, shortness of breath, wheezing and stridor.   Cardiovascular: Negative for chest pain, palpitations and leg swelling.  Gastrointestinal: Positive for abdominal pain. Negative for nausea, vomiting, diarrhea, constipation, blood in stool, abdominal distention and anal bleeding.  Genitourinary: Negative for dysuria and flank pain.  Musculoskeletal: Negative for myalgias, arthralgias and gait problem.  Skin: Negative for color change and rash.  Neurological: Negative for dizziness and headaches.  Hematological: Negative for adenopathy. Does not bruise/bleed easily.  Psychiatric/Behavioral: Negative for suicidal ideas, sleep disturbance and dysphoric mood. The patient is nervous/anxious.        Objective:   Physical Exam  Constitutional: She is oriented to person, place, and time. She appears well-developed and well-nourished. No distress.  HENT:  Head: Normocephalic and atraumatic.  Right Ear: External ear normal.  Left Ear: External ear normal.  Nose: Nose normal.  Mouth/Throat: Oropharynx is clear and moist. No oropharyngeal exudate.  Eyes: Conjunctivae are normal. Pupils are equal, round, and reactive to light. Right eye exhibits no discharge. Left eye exhibits no discharge. No scleral icterus.  Neck: Normal range of motion. Neck supple. No tracheal deviation  present. No thyromegaly present.  Cardiovascular: Normal rate, regular rhythm, normal heart sounds and intact distal pulses.  Exam reveals no gallop and no friction rub.   No murmur heard. Pulmonary/Chest: Effort normal and breath sounds normal. No accessory muscle usage. Not tachypneic. No respiratory distress. She has no decreased breath sounds. She has no wheezes. She has no rhonchi. She has no rales. She exhibits no tenderness.  Abdominal: Soft. Bowel sounds are normal. She exhibits no distension. There is tenderness (mild epigastric). There is no rebound and no guarding.  Musculoskeletal: Normal range of motion. She exhibits no edema and no tenderness.  Lymphadenopathy:    She has no cervical adenopathy.  Neurological: She is alert and oriented to person, place, and time. No cranial nerve deficit. She exhibits normal muscle tone. Coordination normal.  Skin: Skin is warm and dry. No rash noted. She is not diaphoretic. No erythema. No pallor.  Psychiatric: Her speech is normal and behavior is normal. Judgment and thought content normal. Her mood appears anxious.          Assessment & Plan:

## 2013-09-25 NOTE — Assessment & Plan Note (Signed)
Long-standing anxiety and depression. Symptoms poorly controlled with alprazolam and Lexapro. Long-standing issues with enabling her son who has chronic issues with drug abuse. Will set up psychiatry evaluation.

## 2013-09-25 NOTE — Telephone Encounter (Signed)
Patient came in for an appointment today to discuss this with Dr. Dan Humphreys

## 2013-09-29 ENCOUNTER — Other Ambulatory Visit: Payer: Self-pay | Admitting: Internal Medicine

## 2013-10-27 ENCOUNTER — Encounter: Payer: Self-pay | Admitting: Internal Medicine

## 2013-11-02 ENCOUNTER — Ambulatory Visit: Payer: Medicare Other | Admitting: Internal Medicine

## 2013-11-04 ENCOUNTER — Ambulatory Visit: Payer: Medicare Other | Admitting: Internal Medicine

## 2013-11-10 ENCOUNTER — Ambulatory Visit: Payer: Self-pay | Admitting: Internal Medicine

## 2013-12-10 ENCOUNTER — Ambulatory Visit: Payer: Medicare Other | Admitting: Internal Medicine

## 2013-12-18 ENCOUNTER — Other Ambulatory Visit: Payer: Self-pay | Admitting: Internal Medicine

## 2013-12-23 ENCOUNTER — Other Ambulatory Visit: Payer: Self-pay | Admitting: Internal Medicine

## 2014-01-01 ENCOUNTER — Encounter: Payer: Self-pay | Admitting: Internal Medicine

## 2014-01-01 ENCOUNTER — Encounter (INDEPENDENT_AMBULATORY_CARE_PROVIDER_SITE_OTHER): Payer: Self-pay

## 2014-01-01 ENCOUNTER — Ambulatory Visit (INDEPENDENT_AMBULATORY_CARE_PROVIDER_SITE_OTHER): Payer: Medicare Other | Admitting: Internal Medicine

## 2014-01-01 VITALS — BP 150/80 | HR 65 | Temp 98.2°F | Wt 157.0 lb

## 2014-01-01 DIAGNOSIS — N951 Menopausal and female climacteric states: Secondary | ICD-10-CM

## 2014-01-01 DIAGNOSIS — R5381 Other malaise: Secondary | ICD-10-CM

## 2014-01-01 DIAGNOSIS — G4700 Insomnia, unspecified: Secondary | ICD-10-CM | POA: Insufficient documentation

## 2014-01-01 DIAGNOSIS — M2550 Pain in unspecified joint: Secondary | ICD-10-CM

## 2014-01-01 DIAGNOSIS — R232 Flushing: Secondary | ICD-10-CM

## 2014-01-01 DIAGNOSIS — M1990 Unspecified osteoarthritis, unspecified site: Secondary | ICD-10-CM

## 2014-01-01 DIAGNOSIS — F419 Anxiety disorder, unspecified: Secondary | ICD-10-CM

## 2014-01-01 DIAGNOSIS — R5383 Other fatigue: Secondary | ICD-10-CM

## 2014-01-01 DIAGNOSIS — H409 Unspecified glaucoma: Secondary | ICD-10-CM

## 2014-01-01 DIAGNOSIS — I10 Essential (primary) hypertension: Secondary | ICD-10-CM

## 2014-01-01 DIAGNOSIS — R1013 Epigastric pain: Secondary | ICD-10-CM

## 2014-01-01 DIAGNOSIS — F411 Generalized anxiety disorder: Secondary | ICD-10-CM

## 2014-01-01 LAB — CBC WITH DIFFERENTIAL/PLATELET
BASOS ABS: 0.1 10*3/uL (ref 0.0–0.1)
Basophils Relative: 0.8 % (ref 0.0–3.0)
EOS ABS: 0.6 10*3/uL (ref 0.0–0.7)
Eosinophils Relative: 8 % — ABNORMAL HIGH (ref 0.0–5.0)
HEMATOCRIT: 38.2 % (ref 36.0–46.0)
Hemoglobin: 13.1 g/dL (ref 12.0–15.0)
LYMPHS ABS: 1.7 10*3/uL (ref 0.7–4.0)
Lymphocytes Relative: 21.6 % (ref 12.0–46.0)
MCHC: 34.3 g/dL (ref 30.0–36.0)
MCV: 85.6 fl (ref 78.0–100.0)
Monocytes Absolute: 0.6 10*3/uL (ref 0.1–1.0)
Monocytes Relative: 7.8 % (ref 3.0–12.0)
Neutro Abs: 4.8 10*3/uL (ref 1.4–7.7)
Neutrophils Relative %: 61.8 % (ref 43.0–77.0)
Platelets: 259 10*3/uL (ref 150.0–400.0)
RBC: 4.46 Mil/uL (ref 3.87–5.11)
RDW: 12.9 % (ref 11.5–14.6)
WBC: 7.7 10*3/uL (ref 4.5–10.5)

## 2014-01-01 LAB — LIPID PANEL
CHOL/HDL RATIO: 3
Cholesterol: 259 mg/dL — ABNORMAL HIGH (ref 0–200)
HDL: 79.3 mg/dL (ref >39.00)
Triglycerides: 151 mg/dL — ABNORMAL HIGH (ref 0.0–149.0)
VLDL: 30.2 mg/dL (ref 0.0–40.0)

## 2014-01-01 LAB — COMPREHENSIVE METABOLIC PANEL
ALT: 16 U/L (ref 0–35)
AST: 19 U/L (ref 0–37)
Albumin: 4.1 g/dL (ref 3.5–5.2)
Alkaline Phosphatase: 80 U/L (ref 39–117)
BILIRUBIN TOTAL: 0.7 mg/dL (ref 0.3–1.2)
BUN: 12 mg/dL (ref 6–23)
CHLORIDE: 98 meq/L (ref 96–112)
CO2: 29 mEq/L (ref 19–32)
Calcium: 9.6 mg/dL (ref 8.4–10.5)
Creatinine, Ser: 0.9 mg/dL (ref 0.4–1.2)
GFR: 65.13 mL/min (ref >60.00)
Glucose, Bld: 92 mg/dL (ref 70–99)
Potassium: 4.8 mEq/L (ref 3.5–5.1)
SODIUM: 134 meq/L — AB (ref 135–145)
TOTAL PROTEIN: 7 g/dL (ref 6.0–8.3)

## 2014-01-01 LAB — VITAMIN B12: Vitamin B-12: 394 pg/mL (ref 211–911)

## 2014-01-01 LAB — TSH: TSH: 1.05 u[IU]/mL (ref 0.35–5.50)

## 2014-01-01 LAB — HEMOGLOBIN A1C: HEMOGLOBIN A1C: 5.6 % (ref 4.6–6.5)

## 2014-01-01 LAB — LDL CHOLESTEROL, DIRECT: LDL DIRECT: 152.5 mg/dL

## 2014-01-01 MED ORDER — MELOXICAM 15 MG PO TABS: ORAL_TABLET | ORAL | Status: DC

## 2014-01-01 MED ORDER — LOSARTAN POTASSIUM-HCTZ 100-12.5 MG PO TABS: 1.0000 | ORAL_TABLET | Freq: Every day | ORAL | Status: DC

## 2014-01-01 NOTE — Assessment & Plan Note (Signed)
Generalized anxiety disorder. Recently evaluated by psychiatry, on Lexapro and Xanax. We'll continue.

## 2014-01-01 NOTE — Patient Instructions (Addendum)
Change Losartan-HCTZ to 100-12.5mg  to help better control blood pressure. Recheck potassium level with labs next week.  Follow up 4 weeks for recheck of blood pressure.

## 2014-01-01 NOTE — Assessment & Plan Note (Signed)
Patient reports abdominal pain is improved. She never followed through with GI evaluation and no showed to this visit. Will monitor for any recurrent symptoms.

## 2014-01-01 NOTE — Assessment & Plan Note (Signed)
Arthralgias secondary to osteoarthritis. Continue meloxicam. Encouraged knee replacement as above.

## 2014-01-01 NOTE — Assessment & Plan Note (Signed)
Recently treated for glaucoma. Will try to get records from her ophthalmologist.

## 2014-01-01 NOTE — Assessment & Plan Note (Signed)
Chronic insomnia. Recently started on temazepam by her psychiatrist. Michele Meyer continue.

## 2014-01-01 NOTE — Assessment & Plan Note (Signed)
Severe osteoarthritis bilateral knees limits mobility and physical activity. Encouraged her to consider knee replacement given that she has had no improvement with injected steroid or Synvisc.

## 2014-01-01 NOTE — Assessment & Plan Note (Signed)
Patient notes generalized fatigue. Question if she may have a viral syndrome. Labs including CMP, CBC, B12, thyroid function are all normal. Also suspect that anxiety is playing a role. Continue psychiatric care. Followup in 4 weeks or sooner as needed. If persistent symptoms, consider sleep study.

## 2014-01-01 NOTE — Assessment & Plan Note (Signed)
Recent episodes of hot flashes. Likely menopausal. Labs today including CBC and thyroid function are normal. If symptoms are persisting, consider low-dose SSRI.

## 2014-01-01 NOTE — Progress Notes (Signed)
Subjective:    Patient ID: Michele Meyer, female    DOB: 08-Oct-1946, 68 y.o.   MRN: [id number]  HPI 68 year old female with history of anxiety, hypertension, and recent episode of epigastric abdominal pain presents for followup. She reports that she never followed through with GI evaluation. She reports that epigastric pain has resolved.  She is concerned about several weeks of ongoing generalized fatigue. She notes increased stressors at home dealing with her son and recent death of several family members. She is being followed by psychiatry and was recently started on temazepam to help with sleep. She continues on Xanax 3 times daily. Her dose of Lexapro was decreased to 10 mg daily.  She also continues to have severe pain in both of her knees which limits her physical activity and exercise. She has been followed by orthopedics and underwent steroid and Synvisc injections with no improvement. She is holding off on knee replacement because of ongoing family issues.  She also notes she is recently being followed by ophthalmology for glaucoma.  She is also concerned about recent hot flashes. These occur only during the day and are described as flushing with a sensation of feeling hot. They resolve after a few minutes.  Outpatient Encounter Prescriptions as of 01/01/2014  Medication Sig  . ALPRAZolam (XANAX) 0.5 MG tablet Take 1 tablet (0.5 mg total) by mouth 3 (three) times daily as needed for sleep or anxiety.  . Calcium Carbonate-Vit D-Min (CALTRATE 600+D PLUS PO) Take by mouth.  . escitalopram (LEXAPRO) 10 MG tablet Take by mouth daily. Take 1.5 tablets by mouth daily  . meloxicam (MOBIC) 15 MG tablet TAKE 1 TABLET BY MOUTH EVERY DAY  . metoprolol succinate (TOPROL-XL) 25 MG 24 hr tablet Take 0.5 tablets (12.5 mg total) by mouth 2 (two) times daily.  . pantoprazole (PROTONIX) 20 MG tablet TAKE 2 TABLETS BY MOUTH EVERY DAY  . temazepam (RESTORIL) 15 MG capsule Take 15 mg by mouth at bedtime.    Marland Kitchen latanoprost (XALATAN) 0.005 % ophthalmic solution   . losartan-hydrochlorothiazide (HYZAAR) 100-12.5 MG per tablet Take 1 tablet by mouth daily.  . Multiple Vitamin (MULTIVITAMIN) tablet Take 1 tablet by mouth daily.  Marland Kitchen nystatin (MYCOSTATIN/NYSTOP) 100000 UNIT/GM POWD APPLY TO AFFECTED AREA 3 TIMES DAILY   BP 150/80  Pulse 65  Temp(Src) 98.2 F (36.8 C) (Oral)  Wt 157 lb (71.215 kg)  SpO2 97%    Review of Systems  Constitutional: Negative for fever, chills, appetite change, fatigue and unexpected weight change.  HENT: Negative for congestion, ear pain, sinus pressure, sore throat, trouble swallowing and voice change.   Eyes: Negative for visual disturbance.  Respiratory: Negative for cough, shortness of breath, wheezing and stridor.   Cardiovascular: Negative for chest pain, palpitations and leg swelling.  Gastrointestinal: Negative for nausea, vomiting, abdominal pain, diarrhea, constipation, blood in stool, abdominal distention and anal bleeding.  Genitourinary: Negative for dysuria and flank pain.  Musculoskeletal: Negative for arthralgias, gait problem, myalgias and neck pain.  Skin: Negative for color change and rash.  Neurological: Negative for dizziness and headaches.  Hematological: Negative for adenopathy. Does not bruise/bleed easily.  Psychiatric/Behavioral: Negative for suicidal ideas, sleep disturbance and dysphoric mood. The patient is not nervous/anxious.        Objective:   Physical Exam  Constitutional: She is oriented to person, place, and time. She appears well-developed and well-nourished. No distress.  HENT:  Head: Normocephalic and atraumatic.  Right Ear: External ear normal.  Left Ear: External ear  normal.  Nose: Nose normal.  Mouth/Throat: Oropharynx is clear and moist. No oropharyngeal exudate.  Eyes: Conjunctivae are normal. Pupils are equal, round, and reactive to light. Right eye exhibits no discharge. Left eye exhibits no discharge. No scleral  icterus.  Neck: Normal range of motion. Neck supple. No tracheal deviation present. No thyromegaly present.  Cardiovascular: Normal rate, regular rhythm, normal heart sounds and intact distal pulses.  Exam reveals no gallop and no friction rub.   No murmur heard. Pulmonary/Chest: Effort normal and breath sounds normal. No accessory muscle usage. Not tachypneic. No respiratory distress. She has no decreased breath sounds. She has no wheezes. She has no rhonchi. She has no rales. She exhibits no tenderness.  Musculoskeletal: Normal range of motion. She exhibits no edema and no tenderness.  Lymphadenopathy:    She has no cervical adenopathy.  Neurological: She is alert and oriented to person, place, and time. No cranial nerve deficit. She exhibits normal muscle tone. Coordination normal.  Skin: Skin is warm and dry. No rash noted. She is not diaphoretic. No erythema. No pallor.  Psychiatric: Her speech is normal and behavior is normal. Judgment and thought content normal. Her mood appears anxious.          Assessment & Plan:

## 2014-01-01 NOTE — Progress Notes (Signed)
Pre-visit discussion using our clinic review tool. No additional management support is needed unless otherwise documented below in the visit note.  

## 2014-01-01 NOTE — Assessment & Plan Note (Signed)
BP Readings from Last 3 Encounters:  01/01/14 150/80  09/25/13 160/80  09/09/13 158/80   Blood pressure elevated. Will increase losartan to 100 mg daily. Plan recheck potassium in one week. Followup in one month.

## 2014-01-04 ENCOUNTER — Telehealth: Payer: Self-pay | Admitting: *Deleted

## 2014-01-04 NOTE — Telephone Encounter (Signed)
Her Triam/HCTZ was increased at her last visit and a new prescription was sent to the pharmacy. She picked up the prescription but somehow lost the medication and can not find it anywhere. Would like to know if it would be ok for her to double up on the pills she already has at home?

## 2014-01-04 NOTE — Telephone Encounter (Signed)
No, she will need to ask the pharmacy to refill. I don't want to double the dose of the HCTZ. The medication should be generic and inexpensive.

## 2014-01-04 NOTE — Telephone Encounter (Signed)
Patient informed and verbally agreed. She will contact her pharmacist.

## 2014-01-08 ENCOUNTER — Other Ambulatory Visit: Payer: Medicare Other

## 2014-01-13 ENCOUNTER — Other Ambulatory Visit (INDEPENDENT_AMBULATORY_CARE_PROVIDER_SITE_OTHER): Payer: Medicare Other

## 2014-01-13 ENCOUNTER — Telehealth: Payer: Self-pay | Admitting: *Deleted

## 2014-01-13 DIAGNOSIS — I10 Essential (primary) hypertension: Secondary | ICD-10-CM

## 2014-01-13 DIAGNOSIS — E785 Hyperlipidemia, unspecified: Secondary | ICD-10-CM

## 2014-01-13 LAB — LIPID PANEL
CHOLESTEROL: 226 mg/dL — AB (ref 0–200)
HDL: 77 mg/dL (ref >39.00)
TRIGLYCERIDES: 113 mg/dL (ref 0.0–149.0)
Total CHOL/HDL Ratio: 3
VLDL: 22.6 mg/dL (ref 0.0–40.0)

## 2014-01-13 LAB — COMPREHENSIVE METABOLIC PANEL
ALK PHOS: 77 U/L (ref 39–117)
ALT: 16 U/L (ref 0–35)
AST: 19 U/L (ref 0–37)
Albumin: 4 g/dL (ref 3.5–5.2)
BILIRUBIN TOTAL: 0.5 mg/dL (ref 0.3–1.2)
BUN: 17 mg/dL (ref 6–23)
CO2: 29 meq/L (ref 19–32)
CREATININE: 1 mg/dL (ref 0.4–1.2)
Calcium: 9.4 mg/dL (ref 8.4–10.5)
Chloride: 96 mEq/L (ref 96–112)
GFR: 61.97 mL/min (ref >60.00)
GLUCOSE: 87 mg/dL (ref 70–99)
Potassium: 3.8 mEq/L (ref 3.5–5.1)
Sodium: 132 mEq/L — ABNORMAL LOW (ref 135–145)
Total Protein: 7.2 g/dL (ref 6.0–8.3)

## 2014-01-13 LAB — LDL CHOLESTEROL, DIRECT: Direct LDL: 135.3 mg/dL

## 2014-01-13 NOTE — Telephone Encounter (Signed)
OK. She should reduce losartan dose back to 50mg  daily and set up visit to evaluate and recheck BP this week.

## 2014-01-13 NOTE — Telephone Encounter (Signed)
Spoke with patient, she can not cut the Losartan in half for her to take 50 mg.

## 2014-01-13 NOTE — Telephone Encounter (Signed)
Patient came in for her labs today she stated since she began taking the Losartan she has been having blurred vision, headaches and ringing in her ears.

## 2014-01-13 NOTE — Telephone Encounter (Signed)
Called, phone rang then went to busy signal.

## 2014-01-13 NOTE — Telephone Encounter (Signed)
OK. We will need to call in new Rx for Losartan 50mg  daily as she was taking before. #30 with 3 refills.

## 2014-01-14 MED ORDER — LOSARTAN POTASSIUM-HCTZ 50-12.5 MG PO TABS: 1.0000 | ORAL_TABLET | Freq: Every day | ORAL | Status: DC

## 2014-01-14 NOTE — Telephone Encounter (Signed)
Spoke with patient informed of Dr. Thomes Dinning instructions and prescription was just sent to pharmacy. Patient was also given lab results.

## 2014-01-14 NOTE — Telephone Encounter (Signed)
Advised pt new rx for losartan will be called in.

## 2014-01-18 ENCOUNTER — Telehealth: Payer: Self-pay | Admitting: Internal Medicine

## 2014-01-18 NOTE — Telephone Encounter (Signed)
Pt is having issues with her BP.  BP 151/80 HR 67.  States her vision is effected.  Was 180/92 at another doctor's office on Friday 10 a.m., had taken her BP med.  Was told by her therapist/psychologist to let Dr. Gilford Rile know.  Pt states she was to have a recent med change and told to go to pharmacy to pick up new rx.  Pt states she picked up new med but it was the same rx she had been taking before.  Pt asking if she needs a new medication or can only take what she has has.  She is very concerned about her BP.  Pt would like a call.  Pt states she came in previously for blood work due to BP and feeling sick.  States when she came in for blood work she asked to have her BP checked but was told no due to all the machines being tied up.  Is also upset that son was not helped when xanax medication was lost a while back.     Pt would like a call from office manager regarding issues she has had at the office.

## 2014-01-18 NOTE — Telephone Encounter (Signed)
She needs to be seen for blood pressure issues this week.  I will also forward this to [facility] to allow her to address her concerns about the office.

## 2014-01-18 NOTE — Telephone Encounter (Signed)
Fwd to Dr. Walker 

## 2014-01-19 ENCOUNTER — Encounter: Payer: Self-pay | Admitting: Cardiology

## 2014-01-19 ENCOUNTER — Ambulatory Visit (INDEPENDENT_AMBULATORY_CARE_PROVIDER_SITE_OTHER): Payer: Medicare Other | Admitting: Cardiology

## 2014-01-19 ENCOUNTER — Telehealth: Payer: Self-pay

## 2014-01-19 VITALS — BP 158/82 | HR 79 | Ht 62.0 in | Wt 152.5 lb

## 2014-01-19 DIAGNOSIS — I10 Essential (primary) hypertension: Secondary | ICD-10-CM | POA: Insufficient documentation

## 2014-01-19 DIAGNOSIS — E785 Hyperlipidemia, unspecified: Secondary | ICD-10-CM | POA: Insufficient documentation

## 2014-01-19 DIAGNOSIS — R0989 Other specified symptoms and signs involving the circulatory and respiratory systems: Secondary | ICD-10-CM | POA: Insufficient documentation

## 2014-01-19 MED ORDER — VALSARTAN-HYDROCHLOROTHIAZIDE 160-25 MG PO TABS: 1.0000 | ORAL_TABLET | Freq: Every day | ORAL | Status: DC

## 2014-01-19 NOTE — Patient Instructions (Signed)
Please stop your losartan.  Please start Diovan HCT 160/25mg  daily.  Your physician has requested that you have a renal artery duplex. During this test, an ultrasound is used to evaluate blood flow to the kidneys. Allow one hour for this exam. Do not eat after midnight the day before and avoid carbonated beverages. Take your medications as you usually do.  Please come into the office next week for a blood pressure check.

## 2014-01-19 NOTE — Progress Notes (Signed)
PATIENT: Michele Meyer MRN: [id number]  DOB: Jan 09, 1946   DOV:01/19/2014 PCP: [name], MD  Clinic Note: Chief Complaint  Patient presents with  . other    Patient c/o elevated BP wtih a headache and tingling in arms and legs. The patient is having a lot of stress at home with her son. Medications reviewed by the patient verbally.    HPI: Michele Meyer is a 68 y.o.  female with a PMH below who presents today for earlier than routine followup for accelerated hypertension.  Interval History: She is a very pleasant woman who is seen Dr. Cathie Olden a couple of occasions last year for hypertension. She had been doing relatively well on her current regimen notes over the last month or so that her blood pressure levels have gotten progressively higher. It would appear that she has been on Bystolic but had intolerance, and was switched to metoprolol succinate. She was unable to tolerate a full 25 mg a this in one dosing because of what she said was hypotension but was also probably more related to bradycardia. She is now on total half twice a day of metoprolol succinate. She is on a combination of losartan/HCTZ 50/12.5 mg daily. She went to go see her PCP for this elevated pressure where pressures were ranging in the 18 to her losartan HCTZ combination was increased to twice the dose which is 100/25 mg daily. However after about a week of this she was not noticing improvement blood pressures but was noticing worsening headache. Unfortunately in order to change the medication simply the dose was cut back to original dose. He now presents still with elevated hypertension.  She says that she can really tell what her pressures high poorly tired than usual because of headaches and difficulty with vision. (It is important to note that her granddaughter mentions that the patient has also recently stopped her eyedrops). Besides his headaches and blurred vision, she denies any worsening symptoms of edema or  dyspnea. No dyspnea with exertion or with rest. No real PND, orthopnea or edema. No syncope or near syncope symptoms no rapid or irregular heartbeats. Does get a little lightheaded and dizzy but denies any TIA or amaurosis fugax-type symptoms. No syncope or near syncope type symptoms. She denies any melena hematochezia or hematuria. No claudication or nosebleeds.  Past Medical History  Diagnosis Date  . Anxiety   . Cystocele   . Incomplete bladder emptying   . Chronic cystitis   . Stress incontinence   . Depression   . UTI (lower urinary tract infection)   . Skin cancer   . Arthritis   . Gestational diabetes mellitus   . Endometriosis   . Glaucoma   . Uterovaginal prolapse, incomplete   . Skin cancer   . Labile hypertension   . Dyslipidemia     Prior Cardiac Evaluation and Past Surgical History: Past Surgical History  Procedure Laterality Date  . Removal of first rib      bilaterally  . Prolapsed bladder      Repair Dr.Cope  . Abdominal hysterectomy    . Vein ligation and stripping    . Breast biopsy    . Tonsillectomy    . Appendectomy    . Breast lumpectomy    . Pubovaginal sling    . Anterior and posterior vaginal repair      Allergies  Allergen Reactions  . Ace Inhibitors   . Codeine   . Erythromycin   . Latex   .  Penicillins Cross Reactors   . Prednisone     Headache, GI upset    Current Outpatient Prescriptions  Medication Sig Dispense Refill  . ALPRAZolam (XANAX) 0.5 MG tablet Take 1 tablet (0.5 mg total) by mouth 3 (three) times daily as needed for sleep or anxiety.  90 tablet  2  . Calcium Carbonate-Vit D-Min (CALTRATE 600+D PLUS PO) Take by mouth.      . escitalopram (LEXAPRO) 10 MG tablet Take by mouth daily. Take 1.5 tablets by mouth daily      . latanoprost (XALATAN) 0.005 % ophthalmic solution       . meloxicam (MOBIC) 15 MG tablet TAKE 1 TABLET BY MOUTH EVERY DAY  90 tablet  1  . metoprolol succinate (TOPROL-XL) 25 MG 24 hr tablet Take 0.5  tablets (12.5 mg total) by mouth 2 (two) times daily.  90 tablet  3  . Multiple Vitamin (MULTIVITAMIN) tablet Take 1 tablet by mouth daily.      Marland Kitchen nystatin (MYCOSTATIN/NYSTOP) 100000 UNIT/GM POWD APPLY TO AFFECTED AREA 3 TIMES DAILY  15 g  3  . pantoprazole (PROTONIX) 20 MG tablet TAKE 2 TABLETS BY MOUTH EVERY DAY  60 tablet  5  . temazepam (RESTORIL) 15 MG capsule Take 15 mg by mouth at bedtime.      . valsartan-hydrochlorothiazide (DIOVAN HCT) 160-25 MG per tablet Take 1 tablet by mouth daily.  30 tablet  6   No current facility-administered medications for this visit.    History   Social History Narrative   Married, mother of 66, with at least one granddaughter who is present today.   Daily Caffeine Use:  2 cups in am;   She does not exercise routinely. Former smoker who quit in 1965.   Takes occasional alcohol beverage.   As the name is [facility] granddaughter's name is Citlally Ogando   ROS: A comprehensive Review of Systems - Negative except Mild GERD sensation. Significant arthritis pain, was notably with her knees bilaterally. She takes Mobic for this.  PHYSICAL EXAM BP 158/82  Pulse 79  Ht 5\' 2"  (1.575 m)  Wt 152 lb 8 oz (69.174 kg)  BMI 27.89 kg/m2 General appearance: alert, cooperative, appears stated age, no distress and Mild overweight in the truncal area. Well-nourished and well-groomed. Answers questions appropriately. Neck: no adenopathy, no carotid bruit, no JVD, supple, symmetrical, trachea midline and thyroid not enlarged, symmetric, no tenderness/mass/nodules Lungs: clear to auscultation bilaterally, normal percussion bilaterally and Nonlabored, good air movement Heart: normal apical impulse, regularly irregular rhythm, S1, S2 normal, S4 present and No M./R. Abdomen: soft, non-tender; bowel sounds normal; no masses,  no organomegaly Extremities: extremities normal, atraumatic, no cyanosis or edema Pulses: 2+ and symmetric Skin: Skin color, texture, turgor normal. No  rashes or lesions Neurologic: Alert and oriented X 3, normal strength and tone. Normal symmetric reflexes. Normal coordination and gait  [id number] today: Yes Rate: 79 , Rhythm: NSR; otherwise normal EKG.;    Recent Labs: From 01/13/2014:   Potassium 3.8, sodium 132, BUN/creatinine 17/1.0.  CC 226, TG 113, HDL 77, LDL 135.   ASSESSMENT / PLAN: Labile hypertension Her blood pressure is actually not that bad today at 158/82, however she said recently it is high at 99991111 systolic.  I think that we need to use a stronger ARB dose, she did not tolerate increasing the losartan, therefore will switch her to valsartan/HCTZ at a half maximal dose of 160 mg/25 mg. My next thought process would include converting from Toprol to  a more combined alpha beta and effective carvedilol which may be more likely to help with blood pressure and less prone to bradycardia. We may very well need an additional medication  I would make gentle steps and have her followup. I will have her come back next week for his blood pressure checked here but my general plan will be to have her our clinical pharmacist Tommy Medal at our [facility] office ([place] frequ[place] from [place] and [place]). Erasmo Downer has taken her best interest in managing difficult to control hypertension I will medication adjustments.  Hyperlipidemia Plan per PCP is dietary modification with exercise. The section of hypertension, she does not have that much in the way of risk factors.  Essential hypertension, malignant Based on her intermittent readings in the 180-190 mm mercury range. It's prudent to evaluate for renal artery stenosis. No guarding or Dopplers to confirm no stenoses which could explain her labile pressures.    Orders Placed This Encounter  Procedures  . EKG 12-Lead  . Renal Artery Duplex Bilateral    Standing Status: Future     Number of Occurrences:      Standing Expiration Date: 01/19/2015    Order  Specific Question:  Laterality    Answer:  Bilateral    Order Specific Question:  Where should this test be performed:    Answer:  [facility]   Meds ordered this encounter  Medications  . valsartan-hydrochlorothiazide (DIOVAN HCT) 160-25 MG per tablet    Sig: Take 1 tablet by mouth daily.    Dispense:  30 tablet    Refill:  6   Close to 30 minutes spent in direct consultation with the patient. Followup: BP Check next week; consider setting up with Tommy Medal, RPH- CCP.  Addelyn Alleman W. Ellyn Hack, M.D., M.S. [facility] [address] [place], [place]  [id number]  [phone] Pager # [phone]

## 2014-01-19 NOTE — Telephone Encounter (Signed)
No answer and the voicemail hasn't been set up yet.

## 2014-01-19 NOTE — Telephone Encounter (Signed)
Spoke w/ pt.  She reports that her BP has been elevated since before her last appt w/ Dr. Acie Fredrickson in April 2014. She states that her ophthalmologist told her that her elevated BP is starting to negatively affect her vision. Pt reports a chronic headache that will not go away.  She has not tried anything to relieve her HA, as she takes meloxicam and cannot take other NSAIDs and states that she is scared to take anything. Pt states that she previously saw Dr. Gilford Rile to address HTN, but med changes have not helped.   Pt usually sees Dr. Acie Fredrickson in our office, but would like to be seen today. Pt sched to see Dr. Ellyn Hack today at 2:30.  Pt is very appreciative of this and will keep appt.

## 2014-01-19 NOTE — Telephone Encounter (Signed)
Patient called back and spoke with Michele Meyer and was on hold. She had hung up before I got to the phone. I tried calling patient back not available to leave a voicemail and no answer.

## 2014-01-19 NOTE — Assessment & Plan Note (Signed)
Based on her intermittent readings in the 180-190 mm mercury range. It's prudent to evaluate for renal artery stenosis. No guarding or Dopplers to confirm no stenoses which could explain her labile pressures.

## 2014-01-19 NOTE — Telephone Encounter (Signed)
Pt called, states her BP is 181/90,  176/80, states this am after meds 156/80 hr 69. Please call. Dr. Acie Fredrickson pt.

## 2014-01-19 NOTE — Assessment & Plan Note (Signed)
Plan per PCP is dietary modification with exercise. The section of hypertension, she does not have that much in the way of risk factors.

## 2014-01-19 NOTE — Assessment & Plan Note (Signed)
Michele Meyer blood pressure is actually not that bad today at 158/82, however Michele Meyer said recently it is high at 149 systolic.  I think that we need to use a stronger ARB dose, Michele Meyer did not tolerate increasing the losartan, therefore will switch Michele Meyer to valsartan/HCTZ at a half maximal dose of 160 mg/25 mg. My next thought process would include converting from Toprol to a more combined alpha beta and effective carvedilol which may be more likely to help with blood pressure and less prone to bradycardia. We may very well need an additional medication  I would make gentle steps and have Michele Meyer followup. I will have Michele Meyer come back next week for his blood pressure checked here but my general plan will be to have Michele Meyer our clinical pharmacist Tommy Medal at our [facility] office (Michele Meyer [place] from [place] and [place]). Erasmo Downer has taken Michele Meyer best interest in managing difficult to control hypertension I will medication adjustments.

## 2014-01-20 NOTE — Telephone Encounter (Signed)
Pt returned your call [phone]

## 2014-01-26 ENCOUNTER — Telehealth: Payer: Self-pay

## 2014-01-26 NOTE — Telephone Encounter (Signed)
Pt came in for BP check today: 138/70.

## 2014-01-27 NOTE — Telephone Encounter (Signed)
I have followed notes with Cardiology. Fine to delay appointment with us,if things going well.

## 2014-01-27 NOTE — Telephone Encounter (Signed)
Called and spoke with patient in reference to scheduling an appointment with Dr.Walker and patient declined. Stated she was seen by Dr. Tarri Glenn on Thursday and he changed her blood pressure medication, this seems to be working for her. She is currently taking Valsartan/HCTZ 160/25 mg daily and 1/2 Metoprolol in the morning and 1/2 Metoprolol at night. Also wanted to let you know her psychiatrist increased her Lexapro to 1.5 tablets daily. But if you would still like for her to come in then she would be more than happy to do so.

## 2014-01-27 NOTE — Telephone Encounter (Signed)
Ok, patient ok. Fwd back to [facility] for other issues.

## 2014-02-03 NOTE — Telephone Encounter (Signed)
We cannot discuss her son's care with her. He is an adult.

## 2014-02-03 NOTE — Telephone Encounter (Signed)
I called the patient and spoke with patient. She said that the issue was with her son. She states that dr. Gilford Rile was taking care of him he was on xanax due to anxiety.She states that her son takes it every day and [place] and his medication was in his ball bag which fell out of the back of the truck. She said that when this happened she told her son to just call us and tell us what happened. She said that she came in with her son to sign a form and when she came in with him she got attitude from a nurse. She said that the nurse took him in a room crossed her arms and said Dr. Gilford Rile isn't prescribing him any more xanax's she has written him a prescription for valium. Her son is now going to another provider who won't prescribe him xanax says that he has been added to the list of "drug abuser". I told patient I am not sure of a list like that but would ask.  Patient states she also came in a little later to get blood drawn and told the person who drew her blood that she just didn't feel right and asked for her blood pressure to be checked.  "She was told that all the machines were tied up and that she would be charged for a blood pressure check" she left with out having that done since she felt "she wasn't important". She likes Dr. Gilford Rile as a provider but feels "[place] has too many rules and it is no longer to take care of the patients".

## 2014-02-05 ENCOUNTER — Encounter: Payer: Self-pay | Admitting: Internal Medicine

## 2014-02-05 ENCOUNTER — Ambulatory Visit (INDEPENDENT_AMBULATORY_CARE_PROVIDER_SITE_OTHER): Payer: Medicare Other | Admitting: Internal Medicine

## 2014-02-05 ENCOUNTER — Encounter (INDEPENDENT_AMBULATORY_CARE_PROVIDER_SITE_OTHER): Payer: Self-pay

## 2014-02-05 ENCOUNTER — Encounter: Payer: Self-pay | Admitting: *Deleted

## 2014-02-05 VITALS — BP 140/78 | HR 76 | Temp 98.4°F | Wt 157.0 lb

## 2014-02-05 DIAGNOSIS — M129 Arthropathy, unspecified: Secondary | ICD-10-CM

## 2014-02-05 DIAGNOSIS — K219 Gastro-esophageal reflux disease without esophagitis: Secondary | ICD-10-CM

## 2014-02-05 DIAGNOSIS — H9209 Otalgia, unspecified ear: Secondary | ICD-10-CM

## 2014-02-05 DIAGNOSIS — I10 Essential (primary) hypertension: Secondary | ICD-10-CM

## 2014-02-05 DIAGNOSIS — H9201 Otalgia, right ear: Secondary | ICD-10-CM | POA: Insufficient documentation

## 2014-02-05 DIAGNOSIS — M1990 Unspecified osteoarthritis, unspecified site: Secondary | ICD-10-CM

## 2014-02-05 LAB — COMPREHENSIVE METABOLIC PANEL
ALK PHOS: 73 U/L (ref 39–117)
ALT: 16 U/L (ref 0–35)
AST: 20 U/L (ref 0–37)
Albumin: 4 g/dL (ref 3.5–5.2)
BUN: 13 mg/dL (ref 6–23)
CO2: 29 mEq/L (ref 19–32)
Calcium: 9.8 mg/dL (ref 8.4–10.5)
Chloride: 95 mEq/L — ABNORMAL LOW (ref 96–112)
Creatinine, Ser: 0.8 mg/dL (ref 0.4–1.2)
GFR: 76.65 mL/min (ref >60.00)
Glucose, Bld: 91 mg/dL (ref 70–99)
POTASSIUM: 4.6 meq/L (ref 3.5–5.1)
Sodium: 133 mEq/L — ABNORMAL LOW (ref 135–145)
Total Bilirubin: 0.6 mg/dL (ref 0.3–1.2)
Total Protein: 7.2 g/dL (ref 6.0–8.3)

## 2014-02-05 MED ORDER — CELECOXIB 100 MG PO CAPS: 100.0000 mg | ORAL_CAPSULE | Freq: Two times a day (BID) | ORAL | Status: DC

## 2014-02-05 NOTE — Progress Notes (Signed)
Subjective:    Patient ID: Michele Meyer, female    DOB: Jan 27, 1946, 68 y.o.   MRN: [id number]  HPI 68YO female presents for follow up.  HTN - recently started on valsartan. BP has been better controlled per pt report, typically near 130s/80s. Notes that vision is improved with better control of BP. Headaches have resolved.Renal artery doppler pending for next week.  Had stopped meloxicam because of HTN, but recently started back on this medication because of worsening joint pain, most severe in her knees. Would like to consider alternative medication if possible.  Also concerned about persistent upper abdominal pain and acid reflux. Previously referred for GI evaluation, but did not keep this appointment.Intermittent burning pain noted. Improved somewhat with Pantoprazole. No nausea, vomiting, change in appetite.  Also concerned about several weeks of worsening right ear pain. Notes decreased hearing from right ear. No fever, chills, nasal congestion. No drainage noted from ear. Not taking anything for this.  Review of Systems  Constitutional: Negative for fever, chills, appetite change, fatigue and unexpected weight change.  HENT: Positive for ear pain (right). Negative for congestion, sinus pressure, sore throat, trouble swallowing and voice change.   Eyes: Negative for visual disturbance.  Respiratory: Negative for cough, shortness of breath, wheezing and stridor.   Cardiovascular: Negative for chest pain, palpitations and leg swelling.  Gastrointestinal: Positive for abdominal pain. Negative for nausea, vomiting, diarrhea, constipation, blood in stool, abdominal distention and anal bleeding.  Genitourinary: Negative for dysuria and flank pain.  Musculoskeletal: Positive for arthralgias and back pain. Negative for gait problem, myalgias and neck pain.  Skin: Negative for color change and rash.  Neurological: Negative for dizziness and headaches.  Hematological: Negative for  adenopathy. Does not bruise/bleed easily.  Psychiatric/Behavioral: Negative for suicidal ideas, sleep disturbance and dysphoric mood. The patient is not nervous/anxious.        Objective:    BP 140/78  Pulse 76  Temp(Src) 98.4 F (36.9 C) (Oral)  Wt 157 lb (71.215 kg)  SpO2 97% Physical Exam  Constitutional: She is oriented to person, place, and time. She appears well-developed and well-nourished. No distress.  HENT:  Head: Normocephalic and atraumatic.  Right Ear: External ear normal. Tympanic membrane is perforated. Decreased hearing is noted.  Left Ear: Tympanic membrane, external ear and ear canal normal.  Ears:  Nose: Nose normal.  Mouth/Throat: Oropharynx is clear and moist. No oropharyngeal exudate.  Eyes: Conjunctivae are normal. Pupils are equal, round, and reactive to light. Right eye exhibits no discharge. Left eye exhibits no discharge. No scleral icterus.  Neck: Normal range of motion. Neck supple. No tracheal deviation present. No thyromegaly present.  Cardiovascular: Normal rate, regular rhythm, normal heart sounds and intact distal pulses.  Exam reveals no gallop and no friction rub.   No murmur heard. Pulmonary/Chest: Effort normal and breath sounds normal. No accessory muscle usage. Not tachypneic. No respiratory distress. She has no decreased breath sounds. She has no wheezes. She has no rhonchi. She has no rales. She exhibits no tenderness.  Musculoskeletal: Normal range of motion. She exhibits no edema and no tenderness.  Lymphadenopathy:    She has no cervical adenopathy.  Neurological: She is alert and oriented to person, place, and time. No cranial nerve deficit. She exhibits normal muscle tone. Coordination normal.  Skin: Skin is warm and dry. No rash noted. She is not diaphoretic. No erythema. No pallor.  Psychiatric: She has a normal mood and affect. Her behavior is normal. Judgment and thought  content normal.          Assessment & Plan:   Problem  List Items Addressed This Visit   Arthritis     Chronic OA bilateral knees with severe pain. Symptoms improved with meloxicam, however we discussed risks of use including worsening GERD and potential for elevated BP. Will try change to Celebrex. Follow up with ortho as scheduled.    Relevant Medications      celecoxib (CELEBREX) capsule   Ear pain, right     Right ear pain with a small perforation noted on exam. No signs of infection at present. Decreased hearing noted. Will set up ENT evaluation.    Relevant Orders      Ambulatory referral to ENT   GERD (gastroesophageal reflux disease)     Persistent symptoms of acid reflux likely exacerbated by use of meloxicam. Will change to Celebrex as described. Encouraged her to consider keeping followup with GI for upper endoscopy. Continue Pantoprazole.    Hypertension - Primary (Chronic)      BP Readings from Last 3 Encounters:  02/05/14 140/78  01/19/14 158/82  01/01/14 150/80   BP better controlled with addition of Valsartan. Will continue Valsartan-HCTZ and Metoprolol. Renal function and electrolytes normal today. Follow up 6 months and prn.    Relevant Orders      Comp Met (CMET) (Completed)       Return in about 3 months (around 05/05/2014) for Physical.

## 2014-02-05 NOTE — Progress Notes (Signed)
Pre-visit discussion using our clinic review tool. No additional management support is needed unless otherwise documented below in the visit note.  

## 2014-02-05 NOTE — Assessment & Plan Note (Signed)
Right ear pain with a small perforation noted on exam. No signs of infection at present. Decreased hearing noted. Will set up ENT evaluation.

## 2014-02-05 NOTE — Assessment & Plan Note (Signed)
Persistent symptoms of acid reflux likely exacerbated by use of meloxicam. Will change to Celebrex as described. Encouraged her to consider keeping followup with GI for upper endoscopy. Continue Pantoprazole.

## 2014-02-05 NOTE — Patient Instructions (Signed)
Stop meloxicam and start celebrex 100mg  twice daily to help with arthritis pain. You may take tylenol as needed for breakthrough pain.

## 2014-02-05 NOTE — Assessment & Plan Note (Signed)
BP Readings from Last 3 Encounters:  02/05/14 140/78  01/19/14 158/82  01/01/14 150/80   BP better controlled with addition of Valsartan. Will continue Valsartan-HCTZ and Metoprolol. Renal function and electrolytes normal today. Follow up 6 months and prn.

## 2014-02-05 NOTE — Assessment & Plan Note (Signed)
Chronic OA bilateral knees with severe pain. Symptoms improved with meloxicam, however we discussed risks of use including worsening GERD and potential for elevated BP. Will try change to Celebrex. Follow up with ortho as scheduled.

## 2014-02-08 ENCOUNTER — Telehealth: Payer: Self-pay | Admitting: Internal Medicine

## 2014-02-08 NOTE — Telephone Encounter (Signed)
Relevant patient education assigned to patient using Emmi. ° °

## 2014-03-08 ENCOUNTER — Ambulatory Visit: Payer: Self-pay | Admitting: Internal Medicine

## 2014-03-08 ENCOUNTER — Ambulatory Visit (INDEPENDENT_AMBULATORY_CARE_PROVIDER_SITE_OTHER): Payer: Medicare Other | Admitting: Physician Assistant

## 2014-03-08 ENCOUNTER — Telehealth: Payer: Self-pay | Admitting: Internal Medicine

## 2014-03-08 ENCOUNTER — Encounter: Payer: Self-pay | Admitting: Physician Assistant

## 2014-03-08 VITALS — BP 145/78 | HR 98 | Temp 98.6°F | Resp 16 | Ht 62.0 in | Wt 155.5 lb

## 2014-03-08 DIAGNOSIS — B9789 Other viral agents as the cause of diseases classified elsewhere: Principal | ICD-10-CM

## 2014-03-08 DIAGNOSIS — R059 Cough, unspecified: Secondary | ICD-10-CM

## 2014-03-08 DIAGNOSIS — J069 Acute upper respiratory infection, unspecified: Secondary | ICD-10-CM

## 2014-03-08 DIAGNOSIS — R05 Cough: Secondary | ICD-10-CM

## 2014-03-08 DIAGNOSIS — R509 Fever, unspecified: Secondary | ICD-10-CM

## 2014-03-08 LAB — POCT INFLUENZA A/B
INFLUENZA B, POC: NEGATIVE
Influenza A, POC: NEGATIVE

## 2014-03-08 MED ORDER — BENZONATATE 100 MG PO CAPS: 100.0000 mg | ORAL_CAPSULE | Freq: Two times a day (BID) | ORAL | Status: DC | PRN

## 2014-03-08 NOTE — Telephone Encounter (Signed)
Agree with evaluation today at [facility] location

## 2014-03-08 NOTE — Telephone Encounter (Signed)
Fwd to Dr. Walker 

## 2014-03-08 NOTE — Assessment & Plan Note (Signed)
I agree with UC physician that symptoms seem viral.  Physical exam unremarkable.  Vital signs WNL with O2 sats 100% on RA.  Increase fluid intake.  Rest.  Saline nasal spray. Plain Mucinex. Probiotic.  Rx Tessalon Perles for cough.  Patient instructed to finish antibiotic given by [facility] physician since she has already started course of medication.  Follow-up if symptoms not improving.

## 2014-03-08 NOTE — Telephone Encounter (Signed)
Patient Information:  Caller Name: Bethena Roys  Phone: [phone]  Patient: Michele Meyer, Michele Meyer  Gender: Female  DOB: 1946/01/01  Age: 68 Years  PCP: Ronette Deter (Adults only)  Office Follow Up:  Does the office need to follow up with this patient?: No  Instructions For The Office: N/A   Symptoms  Reason For Call & Symptoms: Pt is calling and states that she is having cough and congestion;  pt went to UC on 03/06/14 and dx with bronchitis; started on Zpak on 03/06/14;  developed a fever on 03/07/14;  also started having SOB on 03/07/14 and started using husband's Albuterol  and her cough is getting worse; no fever today  Reviewed Health History In EMR: Yes  Reviewed Medications In EMR: Yes  Reviewed Allergies In EMR: Yes  Reviewed Surgeries / Procedures: Yes  Date of Onset of Symptoms: 03/02/2014  Treatments Tried: Zpak  Treatments Tried Worked: No  Guideline(s) Used:  Cough  Disposition Per Guideline:   Go to Office Now  Reason For Disposition Reached:   Wheezing is present  Advice Given:  Coughing Spasms:  Drink warm fluids. Inhale warm mist (Reason: both relax the airway and loosen up the phlegm).  Call Back If:  You become worse.  Patient Will Follow Care Advice:  YES  Appointment Scheduled:  03/08/2014 13:00:00 Appointment Scheduled Provider:  Other Appt made at [facility] location with Hassell Done, PA-C Pt feels that she is okay to wait for appt at 1:00pm if sx worsen before being seen will call back

## 2014-03-08 NOTE — Addendum Note (Signed)
Addended by: Rockwell Germany on: 03/08/2014 04:52 PM   Modules accepted: Orders

## 2014-03-08 NOTE — Progress Notes (Signed)
Pre visit review using our clinic review tool, if applicable. No additional management support is needed unless otherwise documented below in the visit note/SLS  

## 2014-03-08 NOTE — Patient Instructions (Signed)
Increase fluid intake.  Rest.  Use saline nasal spray.  Take Plain Mucinex as directed.  Place a humidifier in the bedroom.  Your symptoms seem viral in nature.  This is likely why you feel the z-pack has not helped your symptoms. Your flu swab was negative.  Tylenol if needed for fever.  Call if symptoms are not improving over the next few days.  Most viruses will cause symptoms for 5-7 days but the symptoms usually start to improve after days 3-4.  If symptoms are not improving, please call clinic.  Viral Infections A virus is a type of germ. Viruses can cause:  Minor sore throats.  Aches and pains.  Headaches.  Runny nose.  Rashes.  Watery eyes.  Tiredness.  Coughs.  Loss of appetite.  Feeling sick to your stomach (nausea).  Throwing up (vomiting).  Watery poop (diarrhea). HOME CARE   Only take medicines as told by your doctor.  Drink enough water and fluids to keep your pee (urine) clear or pale yellow. Sports drinks are a good choice.  Get plenty of rest and eat healthy. Soups and broths with crackers or rice are fine. GET HELP RIGHT AWAY IF:   You have a very bad headache.  You have shortness of breath.  You have chest pain or neck pain.  You have an unusual rash.  You cannot stop throwing up.  You have watery poop that does not stop.  You cannot keep fluids down.  You or your child has a temperature by mouth above 102 F (38.9 C), not controlled by medicine.  Your baby is older than 3 months with a rectal temperature of 102 F (38.9 C) or higher.  Your baby is 76 months old or younger with a rectal temperature of 100.4 F (38 C) or higher. MAKE SURE YOU:   Understand these instructions.  Will watch this condition.  Will get help right away if you are not doing well or get worse. Document Released: 11/22/2008 Document Revised: 03/03/2012 Document Reviewed: 04/17/2011 [facility] Patient Information 2014 [place], [place].

## 2014-03-08 NOTE — Progress Notes (Signed)
Patient presents to clinic today c/o nonproductive cough, chest tightness and nasal congestion x 3 days. Patient endorses intermittent fevers, chills and aches.  Symptoms started suddenly 3 days ago.  Husband has been sick with a viral bronchitis. Patient endorses being seen by UC 2 days ago.  Was diagnosed with a viral URI.  Patient states she was given Rx for Azithromycin after she told the physician "I know my body and I don't want pneumonia".  Patient has been taking Z-pack as directed.  Patient states cough is worsening.  Still nonproductive.  Denies chest pain. Denies hx of asthma or allergy.  Denies recent travel.  Past Medical History  Diagnosis Date  . Anxiety   . Cystocele   . Incomplete bladder emptying   . Chronic cystitis   . Stress incontinence   . Depression   . UTI (lower urinary tract infection)   . Skin cancer   . Arthritis   . Gestational diabetes mellitus   . Endometriosis   . Glaucoma   . Uterovaginal prolapse, incomplete   . Skin cancer   . Labile hypertension   . Dyslipidemia     Current Outpatient Prescriptions on File Prior to Visit  Medication Sig Dispense Refill  . ALPRAZolam (XANAX) 0.5 MG tablet Take 1 tablet (0.5 mg total) by mouth 3 (three) times daily as needed for sleep or anxiety.  90 tablet  2  . celecoxib (CELEBREX) 100 MG capsule Take 1 capsule (100 mg total) by mouth 2 (two) times daily.  60 capsule  3  . escitalopram (LEXAPRO) 10 MG tablet Take by mouth daily. Take 1.5 tablets by mouth daily      . latanoprost (XALATAN) 0.005 % ophthalmic solution       . metoprolol succinate (TOPROL-XL) 25 MG 24 hr tablet Take 0.5 tablets (12.5 mg total) by mouth 2 (two) times daily.  90 tablet  3  . nystatin (MYCOSTATIN/NYSTOP) 100000 UNIT/GM POWD APPLY TO AFFECTED AREA 3 TIMES DAILY  15 g  3  . pantoprazole (PROTONIX) 20 MG tablet TAKE 2 TABLETS BY MOUTH EVERY DAY  60 tablet  5  . temazepam (RESTORIL) 15 MG capsule Take 15 mg by mouth at bedtime.      .  valsartan-hydrochlorothiazide (DIOVAN HCT) 160-25 MG per tablet Take 1 tablet by mouth daily.  30 tablet  6  . Calcium Carbonate-Vit D-Min (CALTRATE 600+D PLUS PO) Take by mouth.       No current facility-administered medications on file prior to visit.    Allergies  Allergen Reactions  . Ace Inhibitors   . Codeine   . Erythromycin   . Penicillins Cross Reactors   . Prednisone     Headache, GI upset  . Latex     Sensitive to it but not allergic    Family History  Problem Relation Age of Onset  . Diabetes Mother   . Hypertension Mother   . Depression Father   . Cervical cancer Mother   . Skin cancer Mother   . Hypertension Father   . Hypercholesterolemia Mother   . Hypercholesterolemia Father   . Colon cancer Neg Hx     History   Social History  . Marital Status: Married    Spouse Name: N/A    Number of Children: 28  . Years of Education: N/A   Occupational History  .     Social History Main Topics  . Smoking status: Former Smoker    Quit date: 02/25/1964  . Smokeless  tobacco: Never Used  . Alcohol Use: Yes     Comment: rarely  . Drug Use: No  . Sexual Activity: None   Other Topics Concern  . None   Social History Narrative   Married, mother of 76, with at least one granddaughter who is present today.   Daily Caffeine Use:  2 cups in am;   She does not exercise routinely. Former smoker who quit in 1965.   Takes occasional alcohol beverage.   As the name is [place] granddaughter's name is Jesicca Mordecai   Review of Systems - See HPI.  All other ROS are negative.  BP 145/78  Pulse 98  Temp(Src) 98.6 F (37 C) (Oral)  Resp 16  Ht 5\' 2"  (1.575 m)  Wt 155 lb 8 oz (70.534 kg)  BMI 28.43 kg/m2  SpO2 100%  Physical Exam  Vitals reviewed. Constitutional: She is oriented to person, place, and time and well-developed, well-nourished, and in no distress.  HENT:  Head: Normocephalic and atraumatic.  Right Ear: External ear normal.  Left Ear: External ear  normal.  Nose: Nose normal.  Mouth/Throat: Oropharynx is clear and moist. No oropharyngeal exudate.  TM within normal limits bilaterally.  No TTP of sinuses noted on examination.  Eyes: Conjunctivae are normal. Pupils are equal, round, and reactive to light.  Neck: Neck supple.  Cardiovascular: Normal rate, regular rhythm, normal heart sounds and intact distal pulses.   Pulmonary/Chest: Effort normal and breath sounds normal. No respiratory distress. She has no wheezes. She has no rales. She exhibits no tenderness.  Lymphadenopathy:    She has no cervical adenopathy.  Neurological: She is alert and oriented to person, place, and time.  Skin: Skin is warm and dry. No rash noted.  Psychiatric: Affect normal.    Recent Results (from the past 2160 hour(s))  COMPREHENSIVE METABOLIC PANEL     Status: Abnormal   Collection Time    01/01/14 11:27 AM      Result Value Ref Range   Sodium 134 (*) 135 - 145 mEq/L   Potassium 4.8  3.5 - 5.1 mEq/L   Chloride 98  96 - 112 mEq/L   CO2 29  19 - 32 mEq/L   Glucose, Bld 92  70 - 99 mg/dL   BUN 12  6 - 23 mg/dL   Creatinine, Ser 0.9  0.4 - 1.2 mg/dL   Total Bilirubin 0.7  0.3 - 1.2 mg/dL   Alkaline Phosphatase 80  39 - 117 U/L   AST 19  0 - 37 U/L   ALT 16  0 - 35 U/L   Total Protein 7.0  6.0 - 8.3 g/dL   Albumin 4.1  3.5 - 5.2 g/dL   Calcium 9.6  8.4 - 10.5 mg/dL   GFR 65.13  >60.00 mL/min  CBC WITH DIFFERENTIAL     Status: Abnormal   Collection Time    01/01/14 11:27 AM      Result Value Ref Range   WBC 7.7  4.5 - 10.5 K/uL   RBC 4.46  3.87 - 5.11 Mil/uL   Hemoglobin 13.1  12.0 - 15.0 g/dL   HCT 38.2  36.0 - 46.0 %   MCV 85.6  78.0 - 100.0 fl   MCHC 34.3  30.0 - 36.0 g/dL   RDW 12.9  11.5 - 14.6 %   Platelets 259.0  150.0 - 400.0 K/uL   Neutrophils Relative % 61.8  43.0 - 77.0 %   Lymphocytes Relative 21.6  12.0 -  46.0 %   Monocytes Relative 7.8  3.0 - 12.0 %   Eosinophils Relative 8.0 (*) 0.0 - 5.0 %   Basophils Relative 0.8  0.0 -  3.0 %   Neutro Abs 4.8  1.4 - 7.7 K/uL   Lymphs Abs 1.7  0.7 - 4.0 K/uL   Monocytes Absolute 0.6  0.1 - 1.0 K/uL   Eosinophils Absolute 0.6  0.0 - 0.7 K/uL   Basophils Absolute 0.1  0.0 - 0.1 K/uL  TSH     Status: None   Collection Time    01/01/14 11:27 AM      Result Value Ref Range   TSH 1.05  0.35 - 5.50 uIU/mL  VITAMIN B12     Status: None   Collection Time    01/01/14 11:27 AM      Result Value Ref Range   Vitamin B-12 394  211 - 911 pg/mL  LIPID PANEL     Status: Abnormal   Collection Time    01/01/14 11:27 AM      Result Value Ref Range   Cholesterol 259 (*) 0 - 200 mg/dL   Comment: ATP III Classification       Desirable:  < 200 mg/dL               Borderline High:  200 - 239 mg/dL          High:  > = 240 mg/dL   Triglycerides 151.0 (*) 0.0 - 149.0 mg/dL   Comment: Normal:  <150 mg/dLBorderline High:  150 - 199 mg/dL   HDL 79.30  >39.00 mg/dL   VLDL 30.2  0.0 - 40.0 mg/dL   Total CHOL/HDL Ratio 3     Comment:                Men          Women1/2 Average Risk     3.4          3.3Average Risk          5.0          4.42X Average Risk          9.6          7.13X Average Risk          15.0          11.0                      HEMOGLOBIN A1C     Status: None   Collection Time    01/01/14 11:27 AM      Result Value Ref Range   Hemoglobin A1C 5.6  4.6 - 6.5 %   Comment: Glycemic Control Guidelines for People with Diabetes:Non Diabetic:  <6%Goal of Therapy: <7%Additional Action Suggested:  >8%   LDL CHOLESTEROL, DIRECT     Status: None   Collection Time    01/01/14 11:27 AM      Result Value Ref Range   Direct LDL 152.5     Comment: Optimal:  <100 mg/dLNear or Above Optimal:  100-129 mg/dLBorderline High:  130-159 mg/dLHigh:  160-189 mg/dLVery High:  >190 mg/dL  LIPID PANEL     Status: Abnormal   Collection Time    01/13/14 10:40 AM      Result Value Ref Range   Cholesterol 226 (*) 0 - 200 mg/dL   Comment: ATP III Classification       Desirable:  < 200 mg/dL  Borderline High:  200 - 239 mg/dL          High:  > = 240 mg/dL   Triglycerides 113.0  0.0 - 149.0 mg/dL   Comment: Normal:  <150 mg/dLBorderline High:  150 - 199 mg/dL   HDL 77.00  >39.00 mg/dL   VLDL 22.6  0.0 - 40.0 mg/dL   Total CHOL/HDL Ratio 3     Comment:                Men          Women1/2 Average Risk     3.4          3.3Average Risk          5.0          4.42X Average Risk          9.6          7.13X Average Risk          15.0          11.0                      COMPREHENSIVE METABOLIC PANEL     Status: Abnormal   Collection Time    01/13/14 10:40 AM      Result Value Ref Range   Sodium 132 (*) 135 - 145 mEq/L   Potassium 3.8  3.5 - 5.1 mEq/L   Chloride 96  96 - 112 mEq/L   CO2 29  19 - 32 mEq/L   Glucose, Bld 87  70 - 99 mg/dL   BUN 17  6 - 23 mg/dL   Creatinine, Ser 1.0  0.4 - 1.2 mg/dL   Total Bilirubin 0.5  0.3 - 1.2 mg/dL   Alkaline Phosphatase 77  39 - 117 U/L   AST 19  0 - 37 U/L   ALT 16  0 - 35 U/L   Total Protein 7.2  6.0 - 8.3 g/dL   Albumin 4.0  3.5 - 5.2 g/dL   Calcium 9.4  8.4 - 10.5 mg/dL   GFR 61.97  >60.00 mL/min  LDL CHOLESTEROL, DIRECT     Status: None   Collection Time    01/13/14 10:40 AM      Result Value Ref Range   Direct LDL 135.3     Comment: Optimal:  <100 mg/dLNear or Above Optimal:  100-129 mg/dLBorderline High:  130-159 mg/dLHigh:  160-189 mg/dLVery High:  >190 mg/dL  COMPREHENSIVE METABOLIC PANEL     Status: Abnormal   Collection Time    02/05/14 10:37 AM      Result Value Ref Range   Sodium 133 (*) 135 - 145 mEq/L   Potassium 4.6  3.5 - 5.1 mEq/L   Chloride 95 (*) 96 - 112 mEq/L   CO2 29  19 - 32 mEq/L   Glucose, Bld 91  70 - 99 mg/dL   BUN 13  6 - 23 mg/dL   Creatinine, Ser 0.8  0.4 - 1.2 mg/dL   Total Bilirubin 0.6  0.3 - 1.2 mg/dL   Alkaline Phosphatase 73  39 - 117 U/L   AST 20  0 - 37 U/L   ALT 16  0 - 35 U/L   Total Protein 7.2  6.0 - 8.3 g/dL   Albumin 4.0  3.5 - 5.2 g/dL   Calcium 9.8  8.4 - 10.5 mg/dL   GFR 76.65   >60.00 mL/min    Assessment/Plan: Viral URI  with cough I agree with UC physician that symptoms seem viral.  Physical exam unremarkable.  Vital signs WNL with O2 sats 100% on RA.  Increase fluid intake.  Rest.  Saline nasal spray. Plain Mucinex. Probiotic.  Rx Tessalon Perles for cough.  Patient instructed to finish antibiotic given by [facility] physician since she has already started course of medication.  Follow-up if symptoms not improving.

## 2014-04-01 ENCOUNTER — Encounter (INDEPENDENT_AMBULATORY_CARE_PROVIDER_SITE_OTHER): Payer: Medicare Other

## 2014-04-01 DIAGNOSIS — I10 Essential (primary) hypertension: Secondary | ICD-10-CM

## 2014-04-03 NOTE — Progress Notes (Signed)
Quick Note:  Normal renal artery Doppler scan. Nothing to explain hypertension.  There is an incidental finding of a small cyst on the right kidney.  Leonie Man, MD  ______

## 2014-04-29 ENCOUNTER — Telehealth: Payer: Self-pay | Admitting: *Deleted

## 2014-04-29 NOTE — Telephone Encounter (Signed)
Cardiac clearance faxed to [facility]

## 2014-05-10 ENCOUNTER — Encounter: Payer: Self-pay | Admitting: Internal Medicine

## 2014-05-10 ENCOUNTER — Ambulatory Visit (INDEPENDENT_AMBULATORY_CARE_PROVIDER_SITE_OTHER): Payer: Medicare Other | Admitting: Internal Medicine

## 2014-05-10 VITALS — BP 116/68 | HR 78 | Temp 99.3°F | Ht 62.1 in | Wt 151.5 lb

## 2014-05-10 DIAGNOSIS — M549 Dorsalgia, unspecified: Secondary | ICD-10-CM

## 2014-05-10 DIAGNOSIS — F419 Anxiety disorder, unspecified: Secondary | ICD-10-CM

## 2014-05-10 DIAGNOSIS — F411 Generalized anxiety disorder: Secondary | ICD-10-CM

## 2014-05-10 DIAGNOSIS — Z0000 Encounter for general adult medical examination without abnormal findings: Secondary | ICD-10-CM | POA: Insufficient documentation

## 2014-05-10 DIAGNOSIS — F329 Major depressive disorder, single episode, unspecified: Secondary | ICD-10-CM

## 2014-05-10 DIAGNOSIS — I10 Essential (primary) hypertension: Secondary | ICD-10-CM

## 2014-05-10 DIAGNOSIS — R259 Unspecified abnormal involuntary movements: Secondary | ICD-10-CM

## 2014-05-10 DIAGNOSIS — Z1283 Encounter for screening for malignant neoplasm of skin: Secondary | ICD-10-CM | POA: Insufficient documentation

## 2014-05-10 DIAGNOSIS — F32A Depression, unspecified: Secondary | ICD-10-CM

## 2014-05-10 DIAGNOSIS — R251 Tremor, unspecified: Secondary | ICD-10-CM

## 2014-05-10 DIAGNOSIS — F3289 Other specified depressive episodes: Secondary | ICD-10-CM

## 2014-05-10 LAB — LIPID PANEL
CHOL/HDL RATIO: 3
Cholesterol: 268 mg/dL — ABNORMAL HIGH (ref 0–200)
HDL: 85.4 mg/dL (ref >39.00)
LDL Cholesterol: 166 mg/dL — ABNORMAL HIGH (ref 0–99)
Triglycerides: 81 mg/dL (ref 0.0–149.0)
VLDL: 16.2 mg/dL (ref 0.0–40.0)

## 2014-05-10 LAB — COMPREHENSIVE METABOLIC PANEL
ALK PHOS: 82 U/L (ref 39–117)
ALT: 25 U/L (ref 0–35)
AST: 25 U/L (ref 0–37)
Albumin: 4.1 g/dL (ref 3.5–5.2)
BILIRUBIN TOTAL: 0.6 mg/dL (ref 0.2–1.2)
BUN: 14 mg/dL (ref 6–23)
CO2: 31 meq/L (ref 19–32)
CREATININE: 0.8 mg/dL (ref 0.4–1.2)
Calcium: 9.6 mg/dL (ref 8.4–10.5)
Chloride: 93 mEq/L — ABNORMAL LOW (ref 96–112)
GFR: 72.35 mL/min (ref >60.00)
Glucose, Bld: 79 mg/dL (ref 70–99)
Potassium: 4.1 mEq/L (ref 3.5–5.1)
SODIUM: 131 meq/L — AB (ref 135–145)
TOTAL PROTEIN: 6.9 g/dL (ref 6.0–8.3)

## 2014-05-10 LAB — CBC WITH DIFFERENTIAL/PLATELET
Basophils Absolute: 0.1 10*3/uL (ref 0.0–0.1)
Basophils Relative: 0.8 % (ref 0.0–3.0)
EOS PCT: 4.9 % (ref 0.0–5.0)
Eosinophils Absolute: 0.4 10*3/uL (ref 0.0–0.7)
HEMATOCRIT: 37 % (ref 36.0–46.0)
HEMOGLOBIN: 12.5 g/dL (ref 12.0–15.0)
LYMPHS ABS: 1.4 10*3/uL (ref 0.7–4.0)
Lymphocytes Relative: 18.7 % (ref 12.0–46.0)
MCHC: 33.7 g/dL (ref 30.0–36.0)
MCV: 87.4 fl (ref 78.0–100.0)
MONO ABS: 0.7 10*3/uL (ref 0.1–1.0)
MONOS PCT: 8.8 % (ref 3.0–12.0)
NEUTROS ABS: 5 10*3/uL (ref 1.4–7.7)
Neutrophils Relative %: 66.8 % (ref 43.0–77.0)
PLATELETS: 336 10*3/uL (ref 150.0–400.0)
RBC: 4.24 Mil/uL (ref 3.87–5.11)
RDW: 13.4 % (ref 11.5–15.5)
WBC: 7.5 10*3/uL (ref 4.0–10.5)

## 2014-05-10 LAB — T4, FREE: Free T4: 0.78 ng/dL (ref 0.60–1.60)

## 2014-05-10 LAB — TSH: TSH: 1.37 u[IU]/mL (ref 0.35–4.50)

## 2014-05-10 MED ORDER — NYSTATIN 100000 UNIT/GM EX POWD
CUTANEOUS | Status: DC
Start: 2014-05-10 — End: 2015-08-31

## 2014-05-10 NOTE — Assessment & Plan Note (Signed)
BP Readings from Last 3 Encounters:  05/10/14 116/68  03/08/14 145/78  02/05/14 140/78   BP well controlled on current medications. Will check renal function with labs.

## 2014-05-10 NOTE — Progress Notes (Signed)
The patient is here for annual Medicare Wellness Examination and management of other chronic and acute problems.   The risk factors are reflected in the history.  The roster of all physicians providing medical care to patient - is listed in the Snapshot section of the chart.  Activities of daily living:   The patient is 100% independent in all ADLs: dressing, toileting, feeding as well as independent mobility. Patient lives with husband and two dogs. Free-standing home with both hard and carpeted floors.  One level home.  Home safety :  The patient has smoke detectors in the home.  They wear seatbelts in their car. There are no firearms at home.  There is no violence in the home. They feel safe where they live.  Infectious Risks: There is no risks for hepatitis, STDs or HIV.  There is no  history of blood transfusion.  They have no travel history to infectious disease endemic areas of the world.  Additional Health Care Providers: The patient has seen their dentist in the last six months. Dentist - Dr. Nicki Reaper  They have seen their eye doctor in the last year. Opthalmologist - Dr. Truman Hayward They deny hearing issues. They have deferred audiologic testing in the last year.   They do not  have excessive sun exposure. Discussed the need for sun protection: hats,long sleeves and use of sunscreen if there is significant sun exposure.  Dermatologist - none at present Psychiatry - Dr. Annitta Jersey Cardiology - Dr. Cindie Crumbly - Dr. Maureen Ralphs Urology - Dr. Jacqlyn Larsen  Diet: the importance of a healthy diet is discussed. They do have a healthy diet.  The benefits of regular aerobic exercise were discussed. Patient has not been exercising because of knee pain.  Depression screen: followed by Dr. Annitta Jersey. Notes some fatigue and loss of appetite with use of Sertraline. Goes twice monthly with counselor, Otila Kluver. [place]co.  Cognitive assessment: the patient manages all their financial  and personal affairs and is actively engaged. They could relate day,date,year and events.  The following portions of the patient's history were reviewed and updated as appropriate: allergies, current medications, past family history, past medical history,  past surgical history, past social history and problem list.  Visual acuity was not assessed per patient preference as they have regular follow up with their ophthalmologist. Hearing and body mass index were assessed and reviewed.   During the course of the visit the patient was educated and counseled about appropriate screening and preventive services including : fall prevention , diabetes screening, nutrition counseling, colorectal cancer screening, and recommended immunizations.    Tremor - Concerned about tremor in bilateral hands for less than 1 month. Occasionally has trouble writing. Also notes in legs. Varies in intensity. Not sure if anything, such as caffeine triggers symptoms.  Back pain - notes aching back pain ongoing for months.. Recently stopped Meloxicam. Pain described as aching does not radiate.  Made worse by increased physical activity. Not taking anything for this.  Review of Systems  Constitutional: Negative for fever, chills, appetite change, fatigue and unexpected weight change.  HENT: Negative for congestion, ear pain, sinus pressure, sore throat, trouble swallowing and voice change.   Eyes: Negative for visual disturbance.  Respiratory: Negative for cough, shortness of breath, wheezing and stridor.   Cardiovascular: Negative for chest pain, palpitations and leg swelling.  Gastrointestinal: Negative for nausea, vomiting, abdominal pain, diarrhea, constipation, blood in stool, abdominal distention and anal bleeding.  Genitourinary: Negative for dysuria and flank pain.  Musculoskeletal: Positive for arthralgias (knees), back pain and myalgias. Negative for gait problem and neck pain.  Skin: Negative for color change and  rash.  Neurological: Positive for tremors. Negative for dizziness, weakness and headaches.  Hematological: Negative for adenopathy. Does not bruise/bleed easily.  Psychiatric/Behavioral: Negative for suicidal ideas, sleep disturbance and dysphoric mood. The patient is nervous/anxious.        Objective:    BP 116/68  Pulse 78  Temp(Src) 99.3 F (37.4 C) (Oral)  Ht 5' 2.1" (1.577 m)  Wt 151 lb 8 oz (68.72 kg)  BMI 27.63 kg/m2  SpO2 98% Physical Exam  Constitutional: She is oriented to person, place, and time. She appears well-developed and well-nourished. No distress.  HENT:  Head: Normocephalic and atraumatic.  Right Ear: External ear normal.  Left Ear: External ear normal.  Nose: Nose normal.  Mouth/Throat: Oropharynx is clear and moist. No oropharyngeal exudate.  Eyes: Conjunctivae are normal. Pupils are equal, round, and reactive to light. Right eye exhibits no discharge. Left eye exhibits no discharge. No scleral icterus.  Neck: Normal range of motion. Neck supple. No tracheal deviation present. No thyromegaly present.  Cardiovascular: Normal rate, regular rhythm, normal heart sounds and intact distal pulses.  Exam reveals no gallop and no friction rub.   No murmur heard. Pulmonary/Chest: Effort normal and breath sounds normal. No accessory muscle usage. Not tachypneic. No respiratory distress. She has no decreased breath sounds. She has no wheezes. She has no rales. She exhibits no tenderness. Right breast exhibits no inverted nipple, no mass, no nipple discharge, no skin change and no tenderness. Left breast exhibits no inverted nipple, no mass, no nipple discharge, no skin change and no tenderness. Breasts are symmetrical.  Abdominal: Soft. Bowel sounds are normal. She exhibits no distension and no mass. There is no tenderness. There is no rebound and no guarding.  Musculoskeletal: Normal range of motion. She exhibits no edema and no tenderness.  Lymphadenopathy:    She has no  cervical adenopathy.  Neurological: She is alert and oriented to person, place, and time. She displays tremor (fine, bilateral hands). She displays no atrophy. No cranial nerve deficit or sensory deficit. She exhibits normal muscle tone. Coordination and gait normal.  Skin: Skin is warm and dry. No rash noted. She is not diaphoretic. No erythema. No pallor.     Psychiatric: Her behavior is normal. Judgment and thought content normal. Her mood appears anxious.          Assessment & Plan:   Problem List Items Addressed This Visit   Back pain     Likely muscular strain in the trapezius. Exam normal. Encouraged use of Tylenol or prn Ibuprofen for pain. Pt will call or RTC if no improvement.    Depression   Relevant Medications      sertraline (ZOLOFT) 100 MG tablet   Generalized anxiety disorder     Followed by psychiatry. Recently changed to Sertraline with prn Alprazolam. Doing relatively well. Will continue.    Hypertension (Chronic)      BP Readings from Last 3 Encounters:  05/10/14 116/68  03/08/14 145/78  02/05/14 140/78   BP well controlled on current medications. Will check renal function with labs.    Medicare annual wellness visit, subsequent - Primary     General medical exam including breast exam normal except as noted. PAP and pelvic deferred given pt age and s/p hysterectomy. Colonoscopy UTD. Immunizations UTD, except for Prevnar, which we will plan to give next  year. Will check labs today including CBC, CMP, lipids. Encouraged healthy diet and exercise.    Relevant Orders      Comprehensive metabolic panel      CBC with Differential      Lipid panel   Screening for skin cancer   Relevant Orders      Ambulatory referral to Dermatology   Tremor     Bilateral hand tremor most consistent with benign tremor. However, will check electrolytes, thyroid function with labs. Will set up evaluation with Dr. Carles Collet in neurology.     Relevant Orders      T4, free      TSH       Ambulatory referral to Neurology       Return in about 3 months (around 08/10/2014).

## 2014-05-10 NOTE — Assessment & Plan Note (Signed)
General medical exam including breast exam normal except as noted. PAP and pelvic deferred given pt age and s/p hysterectomy. Colonoscopy UTD. Immunizations UTD, except for Prevnar, which we will plan to give next year. Will check labs today including CBC, CMP, lipids. Encouraged healthy diet and exercise.

## 2014-05-10 NOTE — Progress Notes (Signed)
Pre visit review using our clinic review tool, if applicable. No additional management support is needed unless otherwise documented below in the visit note. 

## 2014-05-10 NOTE — Assessment & Plan Note (Signed)
Likely muscular strain in the trapezius. Exam normal. Encouraged use of Tylenol or prn Ibuprofen for pain. Pt will call or RTC if no improvement.

## 2014-05-10 NOTE — Assessment & Plan Note (Signed)
Followed by psychiatry. Recently changed to Sertraline with prn Alprazolam. Doing relatively well. Will continue.

## 2014-05-10 NOTE — Assessment & Plan Note (Signed)
Bilateral hand tremor most consistent with benign tremor. However, will check electrolytes, thyroid function with labs. Will set up evaluation with Dr. Carles Collet in neurology.

## 2014-05-24 ENCOUNTER — Encounter: Payer: Self-pay | Admitting: Neurology

## 2014-05-24 ENCOUNTER — Ambulatory Visit (INDEPENDENT_AMBULATORY_CARE_PROVIDER_SITE_OTHER): Payer: Medicare Other | Admitting: Neurology

## 2014-05-24 VITALS — BP 138/60 | HR 80 | Ht 63.5 in | Wt 151.0 lb

## 2014-05-24 DIAGNOSIS — G609 Hereditary and idiopathic neuropathy, unspecified: Secondary | ICD-10-CM

## 2014-05-24 DIAGNOSIS — R259 Unspecified abnormal involuntary movements: Secondary | ICD-10-CM

## 2014-05-24 DIAGNOSIS — R200 Anesthesia of skin: Secondary | ICD-10-CM

## 2014-05-24 DIAGNOSIS — R251 Tremor, unspecified: Secondary | ICD-10-CM

## 2014-05-24 DIAGNOSIS — R209 Unspecified disturbances of skin sensation: Secondary | ICD-10-CM

## 2014-05-24 DIAGNOSIS — G629 Polyneuropathy, unspecified: Secondary | ICD-10-CM

## 2014-05-24 LAB — VITAMIN B12: VITAMIN B 12: 493 pg/mL (ref 211–911)

## 2014-05-24 LAB — FOLATE: FOLATE: 15.2 ng/mL

## 2014-05-24 NOTE — Progress Notes (Signed)
Subjective:    Michele Meyer was seen in consultation in the movement disorder clinic at the request of [name], MD.  The evaluation is for tremor.  The patient is a 68 y.o. right handed female with a history of tremor.  Pt reports that the "tremor" began as a sensation in the legs as if they were falling asleep.  She states that they were just numb.  This started about 4 months ago.  This numbness then moved into the hands and face and she is now having pain across the shoulders.  She states that the shaking of the hands is the visual part of it but the numbness is the non visual part of it.  She notes tremor in the hands when holding a bottle of water.  She is seeing a psychiatrist.  Pt states that her psychiatrist told her to tell me that she had something similar and it was a migraine variant.      Affected by caffeine:  no (drinks one cup coffee per day) Affected by alcohol: unknown, doesn't drink Affected by stress: unknown, but admits to stress; son is drug addict x 10 years and she pays all of the bills for him and his family. Affected by fatigue:  no but admits to extreme fatigue Spills soup if on spoon:  no Spills glass of liquid if full:  no Affects ADL's (tying shoes, brushing teeth, etc):  no  Current/Previously tried tremor medications: n/a but on metoprolol already  Current medications that may exacerbate tremor:  n/a  Outside reports reviewed: historical medical records, lab reports and referral letter/letters.  Allergies  Allergen Reactions  . Ace Inhibitors   . Codeine   . Erythromycin   . Penicillins Cross Reactors   . Prednisone     Headache, GI upset  . Latex     Sensitive to it but not allergic    Current Outpatient Prescriptions on File Prior to Visit  Medication Sig Dispense Refill  . latanoprost (XALATAN) 0.005 % ophthalmic solution Place 1 drop into both eyes.       . metoprolol succinate (TOPROL-XL) 25 MG 24 hr tablet Take 0.5 tablets (12.5  mg total) by mouth 2 (two) times daily.  90 tablet  3  . nystatin (MYCOSTATIN/NYSTOP) 100000 UNIT/GM POWD APPLY TO AFFECTED AREA 3 TIMES DAILY  15 g  3  . pantoprazole (PROTONIX) 20 MG tablet TAKE 2 TABLETS BY MOUTH EVERY DAY  60 tablet  5  . sertraline (ZOLOFT) 100 MG tablet Take 100 mg by mouth daily.       . temazepam (RESTORIL) 15 MG capsule Take 15 mg by mouth at bedtime.      . valsartan-hydrochlorothiazide (DIOVAN HCT) 160-25 MG per tablet Take 1 tablet by mouth daily.  30 tablet  6   No current facility-administered medications on file prior to visit.    Past Medical History  Diagnosis Date  . Anxiety   . Cystocele   . Incomplete bladder emptying   . Chronic cystitis   . Stress incontinence   . Depression   . UTI (lower urinary tract infection)   . Skin cancer   . Arthritis   . Gestational diabetes mellitus   . Endometriosis   . Glaucoma   . Uterovaginal prolapse, incomplete   . Skin cancer     basal and squamous cell  . Labile hypertension   . Dyslipidemia     Past Surgical History  Procedure Laterality Date  . Removal of  first rib      bilaterally  . Prolapsed bladder      Repair Dr.Cope  . Abdominal hysterectomy    . Vein ligation and stripping    . Breast biopsy    . Tonsillectomy    . Appendectomy    . Breast lumpectomy      benign  . Pubovaginal sling    . Anterior and posterior vaginal repair      History   Social History  . Marital Status: Married    Spouse Name: N/A    Number of Children: 26  . Years of Education: N/A   Occupational History  .     Social History Main Topics  . Smoking status: Former Smoker    Quit date: 02/25/1964  . Smokeless tobacco: Never Used     Comment: smoked for two months  . Alcohol Use: Yes     Comment: rarely  . Drug Use: No  . Sexual Activity: Not on file   Other Topics Concern  . Not on file   Social History Narrative   Married, mother of 82, with at least one granddaughter who is present today.    Daily Caffeine Use:  2 cups in am;   She does not exercise routinely. Former smoker who quit in 1965.   Takes occasional alcohol beverage.   As the name is [place] granddaughter's name is Cherril Hett    Family Status  Relation Status Death Age  . Mother Deceased     "old age", heart disease  . Father Deceased     suicide  . Son Alive     drug addiction  . Daughter Alive     "tick"   . Daughter Alive     seizures, migraines  . Daughter Alive     healthy  . Daughter Alive     healthy    Review of Systems Decreased appetite with 8 lb weight loss over the last year.  A complete 10 system ROS was obtained and was negative apart from what is mentioned.   Objective:   VITALS:   Filed Vitals:   05/24/14 0957  BP: 138/60  Pulse: 80  Height: 5' 3.5" (1.613 m)  Weight: 151 lb (68.493 kg)   Wt Readings from Last 3 Encounters:  05/24/14 151 lb (68.493 kg)  05/10/14 151 lb 8 oz (68.72 kg)  03/08/14 155 lb 8 oz (70.534 kg)     Gen:  Appears stated age and in NAD. HEENT:  Normocephalic, atraumatic. The mucous membranes are moist. The superficial temporal arteries are without ropiness or tenderness. Cardiovascular: Regular rate and rhythm. Lungs: Clear to auscultation bilaterally. Neck: There are no carotid bruits noted bilaterally.  NEUROLOGICAL:  Orientation:  The patient is alert and oriented x 3.  Recent and remote memory are intact.  Attention span and concentration are normal.  Able to name objects and repeat without trouble.  Fund of knowledge is appropriate Cranial nerves: There is good facial symmetry. The pupils are equal round and reactive to light bilaterally. Fundoscopic exam reveals clear disc margins bilaterally. Extraocular muscles are intact and visual fields are full to confrontational testing. Speech is fluent and clear. Soft palate rises symmetrically and there is no tongue deviation. Hearing is intact to conversational tone. Tone: Tone is good  throughout. Sensation: Sensation is intact to light touch and pinprick throughout (facial, trunk, extremities).  Pinprick is decreased in a stocking distribution. Vibration is intact at the bilateral big toe. There is  no extinction with double simultaneous stimulation. There is no sensory dermatomal level identified. Coordination:  The patient has no dysdiadichokinesia or dysmetria. Motor: Strength is 5/5 in the bilateral upper and lower extremities.  Shoulder shrug is equal bilaterally.  There is no pronator drift.  There are no fasciculations noted. DTR's: Deep tendon reflexes are 2/4 at the bilateral biceps, triceps, brachioradialis, patella and 1/4 at the bilateral achilles.  Plantar responses are downgoing bilaterally. Gait and Station: The patient is able to ambulate without difficulty. The patient is able to heel toe walk without any difficulty. The patient has trouble ambulating in a tandem fashion. The patient is able to stand in the Romberg position.   MOVEMENT EXAM: Tremor:  There is tremor in the UE, noted most significantly with action.  The patient has some difficulty drawing Archimedes spirals bilaterally.  There is no tremor at rest.  The patient has just slight trouble pouring water from one glass to another without spilling it, but has obvious frustration.  Lab Results  Component Value Date   [id number] 865 01/01/2014      No results found for this basename: FOLATE   Lab Results  Component Value Date   HGBA1C 5.6 01/01/2014   Lab Results  Component Value Date   TSH 1.37 05/10/2014      Assessment/Plan:   1.   Tremor.  -This is likely essential tremor, which is worsened by her anxiety.  She and I talked about various treatments and for now decided to hold off.  We did talk about things that could help all of her symptoms.    -She will have an MRI of the brain. 2.  Paresthesias.  -We talked about doing an EMG, and she was not sure if she wanted to do this or not.  She was  going to think about it and she will let me know, as she did not want to schedule it today.  There was some minimal evidence on examination of peripheral neuropathy.  -We talked about treatment that could help with tremor and paresthesias, such as Neurontin and even Cymbalta.  I wrote a letter to her psychiatrist regarding the Cymbalta.  This is not something that I wish to manage, as I am not qualified to manage her depression.  In addition, she was just recently started on Zoloft.  If Cymbalta is not an option, then we can certainly try Neurontin, if the patient would like.  Again, she wants to think about these things after talking this over with her psychiatrist. 3.  I will plan on seeing her back after the MRI of the brain and after the EMG, if she decides to proceed with that.  In the meantime, I am going to get lab work related to peripheral neuropathy, including a repeat B12, folate, RPR, SPEP/UPEP with immunofixation.

## 2014-05-24 NOTE — Patient Instructions (Signed)
1. Your provider has requested that you have labwork completed today. Please go to [facility] on the first floor of this building before leaving the office today. 2. We have scheduled you at [facility] for your MRI on 06/02/14 at 12:00 pm. Please arrive 15 minutes prior and go to 1st floor radiology. If you need to reschedule for any reason please call [phone].

## 2014-05-25 DIAGNOSIS — N281 Cyst of kidney, acquired: Secondary | ICD-10-CM | POA: Insufficient documentation

## 2014-05-25 LAB — RPR

## 2014-05-26 ENCOUNTER — Telehealth: Payer: Self-pay | Admitting: Neurology

## 2014-05-26 LAB — SPEP & IFE WITH QIG
ALPHA-2-GLOBULIN: 9.9 % (ref 7.1–11.8)
Albumin ELP: 60.1 % (ref 55.8–66.1)
Alpha-1-Globulin: 4.2 % (ref 2.9–4.9)
Beta 2: 4.6 % (ref 3.2–6.5)
Beta Globulin: 6.4 % (ref 4.7–7.2)
GAMMA GLOBULIN: 14.8 % (ref 11.1–18.8)
IGM, SERUM: 121 mg/dL (ref 52–322)
IgA: 105 mg/dL (ref 69–380)
IgG (Immunoglobin G), Serum: 1240 mg/dL (ref 690–1700)
TOTAL PROTEIN, SERUM ELECTROPHOR: 7.1 g/dL (ref 6.0–8.3)

## 2014-05-26 NOTE — Telephone Encounter (Signed)
Received a call from Dr. Annitta Jersey, pts psychiatrist.  Doctor was clear in stating that she would not consider any change in antidepressant treatment and that "you are the neurologist and you can decide whats best for your patient."  Thanked Dr. Annitta Jersey for the call.  We will not use cymbalta then as the pt is on zoloft.  If pt wants treatment at all then, we will use the neurontin.  Jade, please let pt know that if she decides that she even wants treatment, we would use neurontin.  Had she decided on whether or not to proceed with EMG yet (see note)

## 2014-05-27 LAB — UIFE/LIGHT CHAINS/TP QN, 24-HR UR
Albumin, U: DETECTED
FREE LAMBDA LT CHAINS, UR: 0.06 mg/dL (ref 0.02–0.67)
Free Kappa Lt Chains,Ur: 0.26 mg/dL (ref 0.14–2.42)
Free Kappa/Lambda Ratio: 4.33 ratio (ref 2.04–10.37)
TOTAL PROTEIN, URINE-UPE24: 0.8 mg/dL

## 2014-05-27 MED ORDER — GABAPENTIN 300 MG PO CAPS: ORAL_CAPSULE | ORAL | Status: DC

## 2014-05-27 NOTE — Telephone Encounter (Signed)
Medication sent to pharmacy. Will make follow up for patient.

## 2014-05-27 NOTE — Telephone Encounter (Signed)
Patient made aware that we can try Neurontin due to not being able to change her antidepressant treatment. She has not decided on the EMG yet. She is seeing a Urologist soon and will make a decision after that appt. She would be willing to try the Neurontin. Dr Tat- please advise the dosage to prescribe.

## 2014-05-27 NOTE — Telephone Encounter (Signed)
Neurontin - 300 mg - 1 po q hs x 1 week, then 1 po bid.  May not be enough but will need to reassess in 3 months.  Make f/u in 3 months.

## 2014-06-02 ENCOUNTER — Ambulatory Visit (HOSPITAL_COMMUNITY)
Admission: RE | Admit: 2014-06-02 | Discharge: 2014-06-02 | Disposition: A | Discharge status: Home / Self Care | Payer: Medicare Other | Source: Ambulatory Visit | Attending: Neurology | Admitting: Neurology

## 2014-06-02 DIAGNOSIS — G629 Polyneuropathy, unspecified: Secondary | ICD-10-CM

## 2014-06-02 DIAGNOSIS — R251 Tremor, unspecified: Secondary | ICD-10-CM

## 2014-06-02 DIAGNOSIS — G609 Hereditary and idiopathic neuropathy, unspecified: Secondary | ICD-10-CM | POA: Insufficient documentation

## 2014-06-02 DIAGNOSIS — R259 Unspecified abnormal involuntary movements: Secondary | ICD-10-CM | POA: Insufficient documentation

## 2014-06-02 DIAGNOSIS — R200 Anesthesia of skin: Secondary | ICD-10-CM

## 2014-06-02 DIAGNOSIS — R209 Unspecified disturbances of skin sensation: Secondary | ICD-10-CM | POA: Insufficient documentation

## 2014-06-03 ENCOUNTER — Telehealth: Payer: Self-pay | Admitting: Neurology

## 2014-06-03 NOTE — Telephone Encounter (Signed)
Message copied by Annamaria Helling on Thu Jun 03, 2014  3:20 PM ------      Message from: TAT, Parker S      Created: Thu Jun 03, 2014  2:55 PM      Regarding: RE: MR results       yes      ----- Message -----         From: Annamaria Helling, CMA         Sent: 06/03/2014  11:46 AM           To: Eustace Quail Tat, DO      Subject: MR results                                               Ok to let patient know MR Brain normal?       ------

## 2014-06-03 NOTE — Telephone Encounter (Signed)
Tried to call patient with MR results. No answer and voicemail not set up.

## 2014-06-08 ENCOUNTER — Telehealth: Payer: Self-pay | Admitting: Neurology

## 2014-06-08 NOTE — Telephone Encounter (Signed)
Patient aware results are all normal. She states she is doing much better on the Neurontin. She will call back to schedule EMG.

## 2014-06-08 NOTE — Telephone Encounter (Signed)
[phone], pt calling for lab and scan results / Sherri S>

## 2014-06-14 ENCOUNTER — Other Ambulatory Visit: Payer: Self-pay | Admitting: *Deleted

## 2014-06-14 ENCOUNTER — Encounter: Payer: Self-pay | Admitting: Internal Medicine

## 2014-06-14 DIAGNOSIS — I10 Essential (primary) hypertension: Secondary | ICD-10-CM

## 2014-06-14 MED ORDER — METOPROLOL SUCCINATE ER 25 MG PO TB24: 12.5000 mg | ORAL_TABLET | Freq: Two times a day (BID) | ORAL | Status: DC

## 2014-06-18 ENCOUNTER — Telehealth: Payer: Self-pay | Admitting: Neurology

## 2014-06-18 NOTE — Telephone Encounter (Signed)
Pt called, she needs an EMG / 42min arm and leg. Pt declines appt at this time, states she has too many other appts right now. She will call back to scheduled at a later date / Sherri S.

## 2014-06-23 NOTE — Telephone Encounter (Signed)
This encounter was created in error - please disregard.

## 2014-07-05 ENCOUNTER — Other Ambulatory Visit: Payer: Self-pay | Admitting: *Deleted

## 2014-07-05 DIAGNOSIS — R200 Anesthesia of skin: Secondary | ICD-10-CM

## 2014-07-08 ENCOUNTER — Other Ambulatory Visit: Payer: Self-pay | Admitting: *Deleted

## 2014-07-08 DIAGNOSIS — I10 Essential (primary) hypertension: Secondary | ICD-10-CM

## 2014-07-08 MED ORDER — VALSARTAN-HYDROCHLOROTHIAZIDE 160-25 MG PO TABS: 1.0000 | ORAL_TABLET | Freq: Every day | ORAL | Status: DC

## 2014-07-08 NOTE — Telephone Encounter (Signed)
Rx refill sent to patient pharmacy   

## 2014-07-25 ENCOUNTER — Other Ambulatory Visit: Payer: Self-pay | Admitting: Internal Medicine

## 2014-07-26 ENCOUNTER — Encounter (HOSPITAL_COMMUNITY): Admission: RE | Payer: Self-pay | Source: Ambulatory Visit

## 2014-07-26 ENCOUNTER — Inpatient Hospital Stay (HOSPITAL_COMMUNITY): Admission: RE | Admit: 2014-07-26 | Payer: Medicare Other | Source: Ambulatory Visit | Admitting: Orthopedic Surgery

## 2014-07-26 SURGERY — ARTHROPLASTY, KNEE, TOTAL
Anesthesia: Choice | Site: Knee | Laterality: Left

## 2014-08-04 ENCOUNTER — Telehealth: Payer: Self-pay | Admitting: *Deleted

## 2014-08-04 NOTE — Telephone Encounter (Signed)
Patient called to cancel her 90 minute EMG she stated she has a family emergency and will be out of town. She will reschedule once she gets back

## 2014-08-05 ENCOUNTER — Encounter: Payer: Medicare Other | Admitting: Neurology

## 2014-08-10 ENCOUNTER — Telehealth: Payer: Self-pay | Admitting: Neurology

## 2014-08-10 NOTE — Telephone Encounter (Signed)
Called patient and 3 month follow up appt made.

## 2014-08-12 ENCOUNTER — Ambulatory Visit (INDEPENDENT_AMBULATORY_CARE_PROVIDER_SITE_OTHER): Payer: Medicare Other | Admitting: Neurology

## 2014-08-16 ENCOUNTER — Encounter: Payer: Self-pay | Admitting: Neurology

## 2014-08-16 ENCOUNTER — Ambulatory Visit (INDEPENDENT_AMBULATORY_CARE_PROVIDER_SITE_OTHER): Payer: Medicare Other | Admitting: Neurology

## 2014-08-16 VITALS — BP 134/70 | HR 71 | Ht 63.0 in | Wt 151.3 lb

## 2014-08-16 DIAGNOSIS — R251 Tremor, unspecified: Secondary | ICD-10-CM

## 2014-08-16 DIAGNOSIS — R259 Unspecified abnormal involuntary movements: Secondary | ICD-10-CM

## 2014-08-16 NOTE — Progress Notes (Signed)
Subjective:    Michele Meyer was seen in consultation in the movement disorder clinic at the request of Michele Mast, MD.  The evaluation is for tremor.  The patient is a 68 y.o. right handed female with a history of tremor.  Pt reports that the "tremor" began as a sensation in the legs as if they were falling asleep.  She states that they were just numb.  This started about 4 months ago.  This numbness then moved into the hands and face and she is now having pain across the shoulders.  She states that the shaking of the hands is the visual part of it but the numbness is the non visual part of it.  She notes tremor in the hands when holding a bottle of water.  She is seeing a psychiatrist.  Pt states that her psychiatrist told her to tell me that she had something similar and it was a migraine variant.      08/16/14 update : The patient returns today for followup.  Patient has a history of essential tremor that is worsened by anxiety.  She had a normal noncontrast MRI of the brain since last visit. Pt reports that Dr. Annitta Meyer told her to tell me that she has a lot of "issues" going on.  She states that Dr. Annitta Meyer told her to ask me if tremor was all anxiety induced.  She states that she only took the neurontin for 1.5 weeks and then she had an appointment with Dr. Annitta Meyer and was telling Dr. Annitta Meyer that she was tired and she was told to d/c the medication.  The patient is not sure, therefore, if it ever helped the tremor or the paresthesias, as she was not on it long enough.  She had labs done for reversible causes of PN and those were okay.  She has not wanted to pursue an EMG.    Current/Previously tried tremor medications: n/a but on metoprolol already  Current medications that may exacerbate tremor:  n/a  Outside reports reviewed: historical medical records, lab reports and referral letter/letters.  Allergies  Allergen Reactions  . Ace Inhibitors   . Codeine   . Erythromycin   . Penicillins  Cross Reactors   . Prednisone     Headache, GI upset  . Latex     Sensitive to it but not allergic    Current Outpatient Prescriptions on File Prior to Visit  Medication Sig Dispense Refill  . ALPRAZolam (XANAX) 0.5 MG tablet Take 0.5 mg by mouth as needed for anxiety or sleep.      Marland Kitchen gabapentin (NEURONTIN) 300 MG capsule Take 1 tablet by mouth at bedtime for 1 week, then 1 tablet BID  60 capsule  2  . latanoprost (XALATAN) 0.005 % ophthalmic solution Place 1 drop into both eyes.       . metoprolol succinate (TOPROL-XL) 25 MG 24 hr tablet Take 0.5 tablets (12.5 mg total) by mouth 2 (two) times daily.  90 tablet  3  . nystatin (MYCOSTATIN/NYSTOP) 100000 UNIT/GM POWD APPLY TO AFFECTED AREA 3 TIMES DAILY  15 g  3  . pantoprazole (PROTONIX) 20 MG tablet TAKE 2 TABLETS BY MOUTH EVERY DAY  60 tablet  5  . sertraline (ZOLOFT) 100 MG tablet Take 100 mg by mouth daily.       . temazepam (RESTORIL) 15 MG capsule Take 15 mg by mouth at bedtime.      . valsartan-hydrochlorothiazide (DIOVAN HCT) 160-25 MG per tablet Take 1  tablet by mouth daily.  30 tablet  5   No current facility-administered medications on file prior to visit.    Past Medical History  Diagnosis Date  . Anxiety   . Cystocele   . Incomplete bladder emptying   . Chronic cystitis   . Stress incontinence   . Depression   . UTI (lower urinary tract infection)   . Skin cancer   . Arthritis   . Gestational diabetes mellitus   . Endometriosis   . Glaucoma   . Uterovaginal prolapse, incomplete   . Skin cancer     basal and squamous cell  . Labile hypertension   . Dyslipidemia     Past Surgical History  Procedure Laterality Date  . Removal of first rib      bilaterally  . Prolapsed bladder      Repair Dr.Cope  . Abdominal hysterectomy    . Vein ligation and stripping    . Breast biopsy    . Tonsillectomy    . Appendectomy    . Breast lumpectomy      benign  . Pubovaginal sling    . Anterior and posterior vaginal  repair      History   Social History  . Marital Status: Married    Spouse Name: N/A    Number of Children: 14  . Years of Education: N/A   Occupational History  .     Social History Main Topics  . Smoking status: Former Smoker    Quit date: 02/25/1964  . Smokeless tobacco: Never Used     Comment: smoked for two months  . Alcohol Use: Yes     Comment: rarely  . Drug Use: No  . Sexual Activity: Not on file   Other Topics Concern  . Not on file   Social History Narrative   Married, mother of 59, with at least one granddaughter who is present today.   Daily Caffeine Use:  2 cups in am;   She does not exercise routinely. Former smoker who quit in 1965.   Takes occasional alcohol beverage.   As the name is [place] granddaughter's name is Michele Meyer    Family Status  Relation Status Death Age  . Mother Deceased     "old age", heart disease  . Father Deceased     suicide  . Son Alive     drug addiction  . Daughter Alive     "tick"   . Daughter Alive     seizures, migraines  . Daughter Alive     healthy  . Daughter Alive     healthy    Review of Systems Decreased appetite with 8 lb weight loss over the last year.  A complete 10 system ROS was obtained and was negative apart from what is mentioned.   Objective:   VITALS:   Filed Vitals:   08/16/14 1017  BP: 134/70  Pulse: 71  Height: 5\' 3"  (1.6 m)  Weight: 151 lb 5 oz (68.635 kg)  SpO2: 97%   Wt Readings from Last 3 Encounters:  08/16/14 151 lb 5 oz (68.635 kg)  05/24/14 151 lb (68.493 kg)  05/10/14 151 lb 8 oz (68.72 kg)     Gen:  Appears stated age and in NAD. HEENT:  Normocephalic, atraumatic. The mucous membranes are moist. The superficial temporal arteries are without ropiness or tenderness. Cardiovascular: Regular rate and rhythm. Lungs: Clear to auscultation bilaterally. Neck: There are no carotid bruits noted bilaterally.  NEUROLOGICAL:  Orientation:  The patient is alert and oriented x 3.   Recent and remote memory are intact.  Attention span and concentration are normal.  Able to name objects and repeat without trouble.  Fund of knowledge is appropriate Cranial nerves: There is good facial symmetry. . Soft palate rises symmetrically and there is no tongue deviation. Hearing is intact to conversational tone. Tone: Tone is good throughout. Sensation: Sensation is intact to light touch throughout.  Pinprick is decreased in a stocking distribution. Vibration is intact at the bilateral big toe but she has difficulty telling when it stops. There is no extinction with double simultaneous stimulation.  Motor: Strength is 5/5 in the bilateral upper and lower extremities.  Shoulder shrug is equal bilaterally.  There is no pronator drift.  There are no fasciculations noted. Gait and Station: The patient is able to ambulate without difficulty. She is a little wide based and antalgic today.  MOVEMENT EXAM: Tremor:  There is tremor in the UE, noted most significantly with action.  It is overall mild.  Lab Results  Component Value Date   RXVQMGQQ76 195 05/24/2014      Lab Results  Component Value Date   FOLATE 15.2 05/24/2014   Lab Results  Component Value Date   HGBA1C 5.6 01/01/2014   Lab Results  Component Value Date   TSH 1.37 05/10/2014      Assessment/Plan:   1.   Tremor.  -This is likely essential tremor, which is worsened by her anxiety.  She has decided to hold on further tx's which I think is reasonable.  She is on low dose metoprolol for other reasons which could be increased in future if needed for tremor control. 2.  Paresthesias.  -She doesn't wish to pursue EMG and I think that is fine.  Labs were negative.  She wants no medication.  Neurontin made her sleepy 3. F/u prn

## 2014-10-27 ENCOUNTER — Telehealth: Payer: Self-pay | Admitting: *Deleted

## 2014-10-27 NOTE — Telephone Encounter (Signed)
Fine to refill. Same instructions. 

## 2014-10-27 NOTE — Telephone Encounter (Signed)
Fax from [facility] requesting Meloxicam 15mg  #30.  Last refill 3.20.15, last OV 5.18.15.  Please advise refill

## 2014-10-28 MED ORDER — MELOXICAM 15 MG PO TABS: 15.0000 mg | ORAL_TABLET | Freq: Every day | ORAL | Status: DC

## 2014-10-28 NOTE — Telephone Encounter (Signed)
Rx sent to pharmacy by escript  

## 2014-11-16 ENCOUNTER — Encounter (INDEPENDENT_AMBULATORY_CARE_PROVIDER_SITE_OTHER): Payer: Self-pay

## 2014-11-16 ENCOUNTER — Other Ambulatory Visit: Payer: Self-pay | Admitting: Internal Medicine

## 2014-11-16 ENCOUNTER — Other Ambulatory Visit (INDEPENDENT_AMBULATORY_CARE_PROVIDER_SITE_OTHER): Payer: Medicare Other

## 2014-11-16 ENCOUNTER — Telehealth: Payer: Self-pay | Admitting: Internal Medicine

## 2014-11-16 ENCOUNTER — Ambulatory Visit: Payer: Medicare Other | Admitting: Internal Medicine

## 2014-11-16 DIAGNOSIS — R300 Dysuria: Secondary | ICD-10-CM

## 2014-11-16 LAB — POCT URINALYSIS DIPSTICK
Bilirubin, UA: NEGATIVE
Glucose, UA: NEGATIVE
Ketones, UA: NEGATIVE
NITRITE UA: NEGATIVE
PH UA: 5.5
PROTEIN UA: NEGATIVE
Spec Grav, UA: 1.015
UROBILINOGEN UA: 1

## 2014-11-16 NOTE — Telephone Encounter (Signed)
Pt now goes to [facility] on [address].[phone])

## 2014-11-17 ENCOUNTER — Other Ambulatory Visit: Payer: Self-pay | Admitting: *Deleted

## 2014-11-17 MED ORDER — CIPROFLOXACIN HCL 500 MG PO TABS: 500.0000 mg | ORAL_TABLET | Freq: Two times a day (BID) | ORAL | Status: DC

## 2014-11-18 LAB — URINE CULTURE

## 2014-11-24 ENCOUNTER — Ambulatory Visit (INDEPENDENT_AMBULATORY_CARE_PROVIDER_SITE_OTHER): Payer: Medicare Other | Admitting: *Deleted

## 2014-11-24 ENCOUNTER — Encounter: Payer: Self-pay | Admitting: Internal Medicine

## 2014-11-24 ENCOUNTER — Ambulatory Visit (INDEPENDENT_AMBULATORY_CARE_PROVIDER_SITE_OTHER): Payer: Medicare Other | Admitting: Internal Medicine

## 2014-11-24 VITALS — BP 121/71 | HR 80 | Temp 98.4°F | Ht 62.1 in | Wt 152.5 lb

## 2014-11-24 DIAGNOSIS — N281 Cyst of kidney, acquired: Secondary | ICD-10-CM

## 2014-11-24 DIAGNOSIS — Q6100 Congenital renal cyst, unspecified: Secondary | ICD-10-CM

## 2014-11-24 DIAGNOSIS — F411 Generalized anxiety disorder: Secondary | ICD-10-CM

## 2014-11-24 DIAGNOSIS — Z23 Encounter for immunization: Secondary | ICD-10-CM

## 2014-11-24 DIAGNOSIS — R300 Dysuria: Secondary | ICD-10-CM

## 2014-11-24 DIAGNOSIS — R1011 Right upper quadrant pain: Secondary | ICD-10-CM | POA: Insufficient documentation

## 2014-11-24 DIAGNOSIS — I10 Essential (primary) hypertension: Secondary | ICD-10-CM

## 2014-11-24 LAB — POCT URINALYSIS DIPSTICK
BILIRUBIN UA: NEGATIVE
GLUCOSE UA: NEGATIVE
KETONES UA: NEGATIVE
Nitrite, UA: NEGATIVE
PH UA: 6
Protein, UA: NEGATIVE
RBC UA: NEGATIVE
SPEC GRAV UA: 1.01
Urobilinogen, UA: 0.2

## 2014-11-24 LAB — CBC WITH DIFFERENTIAL/PLATELET
Basophils Absolute: 0 10*3/uL (ref 0.0–0.1)
Basophils Relative: 0.6 % (ref 0.0–3.0)
EOS PCT: 3.9 % (ref 0.0–5.0)
Eosinophils Absolute: 0.3 10*3/uL (ref 0.0–0.7)
HEMATOCRIT: 37.8 % (ref 36.0–46.0)
HEMOGLOBIN: 12.5 g/dL (ref 12.0–15.0)
LYMPHS ABS: 1.2 10*3/uL (ref 0.7–4.0)
LYMPHS PCT: 15.8 % (ref 12.0–46.0)
MCHC: 33.1 g/dL (ref 30.0–36.0)
MCV: 86.6 fl (ref 78.0–100.0)
MONOS PCT: 9.1 % (ref 3.0–12.0)
Monocytes Absolute: 0.7 10*3/uL (ref 0.1–1.0)
NEUTROS ABS: 5.2 10*3/uL (ref 1.4–7.7)
Neutrophils Relative %: 70.6 % (ref 43.0–77.0)
Platelets: 293 10*3/uL (ref 150.0–400.0)
RBC: 4.36 Mil/uL (ref 3.87–5.11)
RDW: 12.9 % (ref 11.5–15.5)
WBC: 7.4 10*3/uL (ref 4.0–10.5)

## 2014-11-24 NOTE — Assessment & Plan Note (Signed)
BP Readings from Last 3 Encounters:  11/24/14 121/71  08/16/14 134/70  05/24/14 138/60   BP well controlled. Continue current medications. Renal function with labs today.

## 2014-11-24 NOTE — Progress Notes (Signed)
Subjective:    Patient ID: Michele Meyer, female    DOB: Sep 14, 1946, 68 y.o.   MRN: [id number]  HPI 68YO female presents for follow up.  Dysuria - persistent pressure and low back pain even after taking CIpro, however overall symptoms are improved. No fever, chills, flank pain, hematuria.  Right upper quad pain - Occurred for a few days last month. Now resolved. No NVD. No fever, chill. She is s/p cholecystectomy.  Anxiety - Symptoms generally well controlled with Sertraline and prn Alprazolam. She is meeting with new psychiatrist tomorrow.  Review of Systems  Constitutional: Negative for fever, chills, appetite change, fatigue and unexpected weight change.  Eyes: Negative for visual disturbance.  Respiratory: Negative for shortness of breath.   Cardiovascular: Negative for chest pain and leg swelling.  Gastrointestinal: Positive for abdominal pain. Negative for nausea, vomiting, diarrhea, constipation and blood in stool.  Genitourinary: Positive for dysuria, urgency and frequency. Negative for flank pain and pelvic pain.  Skin: Negative for color change and rash.  Hematological: Negative for adenopathy. Does not bruise/bleed easily.  Psychiatric/Behavioral: Negative for suicidal ideas, sleep disturbance and dysphoric mood. The patient is nervous/anxious.        Objective:    BP 121/71 mmHg  Pulse 80  Temp(Src) 98.4 F (36.9 C) (Oral)  Ht 5' 2.1" (1.577 m)  Wt 152 lb 8 oz (69.174 kg)  BMI 27.82 kg/m2  SpO2 99% Physical Exam  Constitutional: She is oriented to person, place, and time. She appears well-developed and well-nourished. No distress.  HENT:  Head: Normocephalic and atraumatic.  Right Ear: External ear normal.  Left Ear: External ear normal.  Nose: Nose normal.  Mouth/Throat: Oropharynx is clear and moist. No oropharyngeal exudate.  Eyes: Conjunctivae and EOM are normal. Pupils are equal, round, and reactive to light. Right eye exhibits no discharge. Left eye  exhibits no discharge. No scleral icterus.  Neck: Normal range of motion. Neck supple. No tracheal deviation present. No thyromegaly present.  Cardiovascular: Normal rate, regular rhythm, normal heart sounds and intact distal pulses.  Exam reveals no gallop and no friction rub.   No murmur heard. Pulmonary/Chest: Effort normal and breath sounds normal. No accessory muscle usage. No tachypnea. No respiratory distress. She has no decreased breath sounds. She has no wheezes. She has no rhonchi. She has no rales. She exhibits no tenderness.  Abdominal: Soft. Bowel sounds are normal. She exhibits no distension and no mass. There is no tenderness. There is no rebound and no guarding.  Musculoskeletal: Normal range of motion. She exhibits no edema or tenderness.  Lymphadenopathy:    She has no cervical adenopathy.  Neurological: She is alert and oriented to person, place, and time. No cranial nerve deficit. She exhibits normal muscle tone. Coordination normal.  Skin: Skin is warm and dry. No rash noted. She is not diaphoretic. No erythema. No pallor.  Psychiatric: She has a normal mood and affect. Her behavior is normal. Judgment and thought content normal.          Assessment & Plan:   Problem List Items Addressed This Visit      High   Generalized anxiety disorder - Primary    Symptoms well controlled on current medication. Continue to follow with psychiatry.    Hypertension (Chronic)    BP Readings from Last 3 Encounters:  11/24/14 121/71  08/16/14 134/70  05/24/14 138/60   BP well controlled. Continue current medications. Renal function with labs today.  Unprioritized   Abdominal pain, right upper quadrant    Recent RUQ pain. Exam normal today. Will check LFTs with labs. If recurrent pain, consider CT abdomen.    Relevant Orders      Comprehensive metabolic panel      CBC with Differential   Dysuria    Repeat UA today pos for leuk only. Will repeat urine culture. Complete  7 days of cipro.    Relevant Orders      POCT Urinalysis Dipstick (Completed)      CULTURE, URINE COMPREHENSIVE    Other Visit Diagnoses    Renal cyst        Relevant Orders       Ambulatory referral to Urology        Return in about 4 weeks (around 12/22/2014) for Recheck.

## 2014-11-24 NOTE — Progress Notes (Signed)
Pre visit review using our clinic review tool, if applicable. No additional management support is needed unless otherwise documented below in the visit note. 

## 2014-11-24 NOTE — Patient Instructions (Signed)
Labs today.  Follow up in 4 weeks or sooner as needed. 

## 2014-11-24 NOTE — Assessment & Plan Note (Signed)
Recent RUQ pain. Exam normal today. Will check LFTs with labs. If recurrent pain, consider CT abdomen.

## 2014-11-24 NOTE — Assessment & Plan Note (Signed)
Repeat UA today pos for leuk only. Will repeat urine culture. Complete 7 days of cipro.

## 2014-11-24 NOTE — Assessment & Plan Note (Signed)
Symptoms well controlled on current medication. Continue to follow with psychiatry.

## 2014-11-25 ENCOUNTER — Other Ambulatory Visit: Payer: Medicare Other

## 2014-11-25 LAB — COMPREHENSIVE METABOLIC PANEL
ALT: 21 U/L (ref 0–35)
AST: 21 U/L (ref 0–37)
Albumin: 4 g/dL (ref 3.5–5.2)
Alkaline Phosphatase: 92 U/L (ref 39–117)
BILIRUBIN TOTAL: 0.5 mg/dL (ref 0.2–1.2)
BUN: 15 mg/dL (ref 6–23)
CALCIUM: 9.2 mg/dL (ref 8.4–10.5)
CO2: 28 meq/L (ref 19–32)
CREATININE: 0.8 mg/dL (ref 0.4–1.2)
Chloride: 93 mEq/L — ABNORMAL LOW (ref 96–112)
GFR: 71.24 mL/min (ref >60.00)
GLUCOSE: 97 mg/dL (ref 70–99)
Potassium: 4 mEq/L (ref 3.5–5.1)
Sodium: 128 mEq/L — ABNORMAL LOW (ref 135–145)
Total Protein: 6.8 g/dL (ref 6.0–8.3)

## 2014-11-26 ENCOUNTER — Ambulatory Visit (INDEPENDENT_AMBULATORY_CARE_PROVIDER_SITE_OTHER): Payer: Medicare Other

## 2014-11-26 ENCOUNTER — Other Ambulatory Visit (INDEPENDENT_AMBULATORY_CARE_PROVIDER_SITE_OTHER): Payer: Medicare Other

## 2014-11-26 ENCOUNTER — Telehealth: Payer: Self-pay | Admitting: *Deleted

## 2014-11-26 ENCOUNTER — Telehealth: Payer: Self-pay

## 2014-11-26 VITALS — BP 132/77 | HR 79

## 2014-11-26 DIAGNOSIS — I10 Essential (primary) hypertension: Secondary | ICD-10-CM

## 2014-11-26 DIAGNOSIS — E871 Hypo-osmolality and hyponatremia: Secondary | ICD-10-CM

## 2014-11-26 LAB — CULTURE, URINE COMPREHENSIVE
Colony Count: NO GROWTH
ORGANISM ID, BACTERIA: NO GROWTH

## 2014-11-26 NOTE — Progress Notes (Signed)
Patient ID: Michele Meyer, female   DOB: 03-16-1946, 68 y.o.   MRN: [id number] Patient presents to the office today stating she feels lethargic and is concerned about her blood pressure. Had patient sit in a chair for 70mins with feet flat on the ground in silence. Took patient's blood pressure after 34mins using the automatic nurse on a stick. Reading is as follows at 12pm: BP:132/77, P:79, O2 sat:97%. Note has been forwarded to PCP for review.

## 2014-11-26 NOTE — Telephone Encounter (Signed)
Pt notified and  verbalized understanding. Pt states she has had increasing fatigue over the last 3-4 days, no other acute symptoms. Had labs drawn today. Scheduled appt with Morey Hummingbird 12/01/14. Advised to seek treatment over weekend with worsening symptoms.  verbalized understanding

## 2014-11-26 NOTE — Telephone Encounter (Signed)
No answer, no VM

## 2014-11-26 NOTE — Telephone Encounter (Signed)
Patient presents to the office today stating she feels lethargic and is concerned about her blood pressure. Had patient sit in a chair for 62mins with feet flat on the ground in silence. Took patient's blood pressure after 13mins using the automatic nurse on a stick. Reading is as follows at 12pm: BP:132/77, P:79, O2 sat:97%. Note has been forwarded to PCP for review.

## 2014-11-26 NOTE — Telephone Encounter (Signed)
BP looks fine. If not feeling well, then should be evaluated in a visit.

## 2014-11-26 NOTE — Telephone Encounter (Signed)
Repeat BMP for hyponatremia

## 2014-11-26 NOTE — Telephone Encounter (Signed)
What labs and dx?  

## 2014-11-28 LAB — BASIC METABOLIC PANEL
BUN: 15 mg/dL (ref 6–23)
CALCIUM: 9.3 mg/dL (ref 8.4–10.5)
CHLORIDE: 93 meq/L — AB (ref 96–112)
CO2: 28 mEq/L (ref 19–32)
Creatinine, Ser: 0.8 mg/dL (ref 0.4–1.2)
GFR: 76.47 mL/min (ref >60.00)
GLUCOSE: 106 mg/dL — AB (ref 70–99)
Potassium: 4.4 mEq/L (ref 3.5–5.1)
Sodium: 129 mEq/L — ABNORMAL LOW (ref 135–145)

## 2014-11-29 ENCOUNTER — Encounter: Payer: Self-pay | Admitting: *Deleted

## 2014-12-01 ENCOUNTER — Ambulatory Visit: Payer: Medicare Other | Admitting: Nurse Practitioner

## 2015-01-04 ENCOUNTER — Encounter: Payer: Self-pay | Admitting: Internal Medicine

## 2015-01-04 ENCOUNTER — Ambulatory Visit (INDEPENDENT_AMBULATORY_CARE_PROVIDER_SITE_OTHER): Payer: Medicare Other | Admitting: Internal Medicine

## 2015-01-04 VITALS — BP 143/72 | HR 69 | Temp 98.0°F | Ht 62.1 in | Wt 151.5 lb

## 2015-01-04 DIAGNOSIS — F329 Major depressive disorder, single episode, unspecified: Secondary | ICD-10-CM

## 2015-01-04 DIAGNOSIS — E871 Hypo-osmolality and hyponatremia: Secondary | ICD-10-CM

## 2015-01-04 DIAGNOSIS — R1011 Right upper quadrant pain: Secondary | ICD-10-CM

## 2015-01-04 DIAGNOSIS — Z1239 Encounter for other screening for malignant neoplasm of breast: Secondary | ICD-10-CM

## 2015-01-04 DIAGNOSIS — F32A Depression, unspecified: Secondary | ICD-10-CM

## 2015-01-04 DIAGNOSIS — R1031 Right lower quadrant pain: Secondary | ICD-10-CM

## 2015-01-04 DIAGNOSIS — R1032 Left lower quadrant pain: Secondary | ICD-10-CM

## 2015-01-04 DIAGNOSIS — I10 Essential (primary) hypertension: Secondary | ICD-10-CM

## 2015-01-04 LAB — POCT URINALYSIS DIPSTICK
Bilirubin, UA: NEGATIVE
Blood, UA: NEGATIVE
Glucose, UA: NEGATIVE
KETONES UA: NEGATIVE
NITRITE UA: NEGATIVE
PH UA: 7
Protein, UA: NEGATIVE
SPEC GRAV UA: 1.01
UROBILINOGEN UA: 1

## 2015-01-04 MED ORDER — TEMAZEPAM 15 MG PO CAPS: 15.0000 mg | ORAL_CAPSULE | Freq: Every day | ORAL | Status: DC

## 2015-01-04 NOTE — Assessment & Plan Note (Signed)
Recent mild hyponatremia likely secondary to HCTZ. Will continue to monitor. Repeat BMP in 02/2015.

## 2015-01-04 NOTE — Progress Notes (Signed)
Subjective:    Patient ID: Michele Meyer, female    DOB: [birth year], 69 y.o.   MRN: [id number]  HPI 68YO female presents for follow up.  Abdominal pain - Diffuse sometimes RUQ and sometimes bilateral lower abdomen with bloating and fullness. Notes early satiety. However, weight has been stable. Occasionally, feels nauseous. No vomiting. Stool has been softer.No blood in stool or black stool. Did not follow through with colonoscopy last year.  Denies symptoms of depression. Recently seen by Dr. Nicolasa Ducking. Symptoms well controlled on current medications.   Wt Readings from Last 3 Encounters:  01/04/15 151 lb 8 oz (68.72 kg)  11/24/14 152 lb 8 oz (69.174 kg)  08/16/14 151 lb 5 oz (68.635 kg)      Past medical, surgical, family and social history per today's encounter.  Review of Systems  Constitutional: Negative for fever, chills, appetite change, fatigue and unexpected weight change.  Eyes: Negative for visual disturbance.  Respiratory: Negative for shortness of breath.   Cardiovascular: Negative for chest pain and leg swelling.  Gastrointestinal: Positive for nausea, abdominal pain, diarrhea and abdominal distention. Negative for vomiting, constipation and blood in stool.  Skin: Negative for color change and rash.  Hematological: Negative for adenopathy. Does not bruise/bleed easily.  Psychiatric/Behavioral: Negative for suicidal ideas, sleep disturbance and dysphoric mood. The patient is not nervous/anxious.        Objective:    BP 143/72 mmHg  Pulse 69  Temp(Src) 98 F (36.7 C) (Oral)  Ht 5' 2.1" (1.577 m)  Wt 151 lb 8 oz (68.72 kg)  BMI 27.63 kg/m2 Physical Exam  Constitutional: She is oriented to person, place, and time. She appears well-developed and well-nourished. No distress.  HENT:  Head: Normocephalic and atraumatic.  Right Ear: External ear normal.  Left Ear: External ear normal.  Nose: Nose normal.  Mouth/Throat: Oropharynx is clear and moist. No  oropharyngeal exudate.  Eyes: Conjunctivae and EOM are normal. Pupils are equal, round, and reactive to light. Right eye exhibits no discharge.  Neck: Normal range of motion. Neck supple. No thyromegaly present.  Cardiovascular: Normal rate, regular rhythm, normal heart sounds and intact distal pulses.  Exam reveals no gallop and no friction rub.   No murmur heard. Pulmonary/Chest: Effort normal. No respiratory distress. She has no wheezes. She has no rales.  Abdominal: Soft. Bowel sounds are normal. She exhibits no distension and no mass. There is tenderness in the right upper quadrant, right lower quadrant and left lower quadrant. There is no rebound and no guarding.  Musculoskeletal: Normal range of motion. She exhibits no edema or tenderness.  Lymphadenopathy:    She has no cervical adenopathy.  Neurological: She is alert and oriented to person, place, and time. No cranial nerve deficit. Coordination normal.  Skin: Skin is warm and dry. No rash noted. She is not diaphoretic. No erythema. No pallor.  Psychiatric: She has a normal mood and affect. Her behavior is normal. Judgment and thought content normal.          Assessment & Plan:   Problem List Items Addressed This Visit      High   Hypertension (Chronic)    BP Readings from Last 3 Encounters:  01/04/15 143/72  11/26/14 132/77  11/24/14 121/71   BP generally well controlled on current medications. Will continue.    Relevant Orders      Urine culture     Unprioritized   Abdominal pain, right upper quadrant - Primary    RUQ  and bilateral lower abdominal pain which has persistent for nearly 1 year. She did not followup for screening colonoscopy as previously ordered. Encouraged her to do this and placed new referral. Question if she may have chronic partial obstruction leading to passage of only small amount of soft stool or chronic colitis contributing to symptoms. Early satiety is concerning for obstruction, however she has  not had weight loss. Will set up CT abdomen for further evaluation.    Relevant Orders      Ambulatory referral to Gastroenterology      CT Abdomen Pelvis W Contrast      Urine culture   Bilateral lower abdominal pain    See above note.Marland KitchenMarland KitchenRUQ and bilateral lower abdominal pain which has persistent for nearly 1 year. She did not followup for screening colonoscopy as previously ordered. Encouraged her to do this and placed new referral. Question if she may have chronic partial obstruction leading to passage of only small amount of soft stool or chronic colitis contributing to symptoms. Early satiety is concerning for obstruction, however she has not had weight loss.  Ovarian pathology also a consideration with bilateral lower abdominal pain and bloating, however she reports having a complete hysterectomy.  Will set up CT abdomen for further evaluation.     Relevant Orders      POCT Urinalysis Dipstick (Completed)      Urine culture   Depression    Encouraged continued follow up with Dr. Nicolasa Ducking.    Hyponatremia    Recent mild hyponatremia likely secondary to HCTZ. Will continue to monitor. Repeat BMP in 02/2015.    Relevant Orders      Urine culture   Screening for breast cancer    Mammogram ordered.    Relevant Orders      MM Digital Screening      Urine culture       Return in about 4 weeks (around 02/01/2015) for Recheck.

## 2015-01-04 NOTE — Patient Instructions (Signed)
We will set up CT abdomen to evaluate abdominal pain.  We will schedule colonoscopy with Dr. Raquel James.  Follow up in 4 weeks.

## 2015-01-04 NOTE — Progress Notes (Signed)
Pre visit review using our clinic review tool, if applicable. No additional management support is needed unless otherwise documented below in the visit note. 

## 2015-01-04 NOTE — Assessment & Plan Note (Signed)
BP Readings from Last 3 Encounters:  01/04/15 143/72  11/26/14 132/77  11/24/14 121/71   BP generally well controlled on current medications. Will continue.

## 2015-01-04 NOTE — Assessment & Plan Note (Signed)
RUQ and bilateral lower abdominal pain which has persistent for nearly 1 year. She did not followup for screening colonoscopy as previously ordered. Encouraged her to do this and placed new referral. Question if she may have chronic partial obstruction leading to passage of only small amount of soft stool or chronic colitis contributing to symptoms. Early satiety is concerning for obstruction, however she has not had weight loss. Will set up CT abdomen for further evaluation.

## 2015-01-04 NOTE — Assessment & Plan Note (Signed)
Mammogram ordered

## 2015-01-04 NOTE — Assessment & Plan Note (Addendum)
See above note.Marland KitchenMarland KitchenRUQ and bilateral lower abdominal pain which has persistent for nearly 1 year. She did not followup for screening colonoscopy as previously ordered. Encouraged her to do this and placed new referral. Question if she may have chronic partial obstruction leading to passage of only small amount of soft stool or chronic colitis contributing to symptoms. Early satiety is concerning for obstruction, however she has not had weight loss.  Ovarian pathology also a consideration with bilateral lower abdominal pain and bloating, however she reports having a complete hysterectomy.  Will set up CT abdomen for further evaluation.

## 2015-01-04 NOTE — Assessment & Plan Note (Signed)
Encouraged continued follow up with Dr. Nicolasa Ducking.

## 2015-01-05 LAB — URINE CULTURE
Colony Count: NO GROWTH
Organism ID, Bacteria: NO GROWTH

## 2015-01-17 ENCOUNTER — Encounter: Payer: Self-pay | Admitting: Internal Medicine

## 2015-01-17 ENCOUNTER — Ambulatory Visit: Payer: Self-pay | Admitting: Internal Medicine

## 2015-01-18 ENCOUNTER — Telehealth: Payer: Self-pay | Admitting: Internal Medicine

## 2015-01-18 NOTE — Telephone Encounter (Signed)
CT abdomen was normal with no acute findings to explain abdominal pain. If abdominal pain persists, I would recommend evaluation with GI physician.

## 2015-01-18 NOTE — Telephone Encounter (Signed)
Notified pt. 

## 2015-01-20 ENCOUNTER — Other Ambulatory Visit: Payer: Self-pay | Admitting: Cardiology

## 2015-01-20 NOTE — Telephone Encounter (Signed)
Rx has been sent to the pharmacy electronically. ° °

## 2015-02-03 ENCOUNTER — Encounter: Payer: Self-pay | Admitting: Internal Medicine

## 2015-02-05 ENCOUNTER — Other Ambulatory Visit: Payer: Self-pay | Admitting: Internal Medicine

## 2015-02-07 ENCOUNTER — Other Ambulatory Visit: Payer: Self-pay | Admitting: *Deleted

## 2015-02-07 MED ORDER — PANTOPRAZOLE SODIUM 20 MG PO TBEC: 40.0000 mg | DELAYED_RELEASE_TABLET | Freq: Every day | ORAL | Status: DC

## 2015-02-08 ENCOUNTER — Ambulatory Visit: Payer: Medicare Other

## 2015-02-08 ENCOUNTER — Encounter: Payer: Self-pay | Admitting: Internal Medicine

## 2015-02-08 ENCOUNTER — Ambulatory Visit (INDEPENDENT_AMBULATORY_CARE_PROVIDER_SITE_OTHER): Payer: Medicare Other | Admitting: Internal Medicine

## 2015-02-08 VITALS — BP 145/75 | HR 78 | Temp 98.8°F | Ht 62.1 in | Wt 153.1 lb

## 2015-02-08 DIAGNOSIS — I10 Essential (primary) hypertension: Secondary | ICD-10-CM

## 2015-02-08 DIAGNOSIS — L609 Nail disorder, unspecified: Secondary | ICD-10-CM

## 2015-02-08 DIAGNOSIS — I700 Atherosclerosis of aorta: Secondary | ICD-10-CM | POA: Insufficient documentation

## 2015-02-08 DIAGNOSIS — R1011 Right upper quadrant pain: Secondary | ICD-10-CM

## 2015-02-08 DIAGNOSIS — L608 Other nail disorders: Secondary | ICD-10-CM

## 2015-02-08 LAB — COMPREHENSIVE METABOLIC PANEL
ALBUMIN: 4.1 g/dL (ref 3.5–5.2)
ALK PHOS: 98 U/L (ref 39–117)
ALT: 20 U/L (ref 0–35)
AST: 19 U/L (ref 0–37)
BUN: 16 mg/dL (ref 6–23)
CALCIUM: 9.9 mg/dL (ref 8.4–10.5)
CHLORIDE: 94 meq/L — AB (ref 96–112)
CO2: 30 mEq/L (ref 19–32)
Creatinine, Ser: 0.92 mg/dL (ref 0.40–1.20)
GFR: 64.1 mL/min (ref >60.00)
Glucose, Bld: 154 mg/dL — ABNORMAL HIGH (ref 70–99)
Potassium: 4.4 mEq/L (ref 3.5–5.1)
SODIUM: 131 meq/L — AB (ref 135–145)
TOTAL PROTEIN: 7 g/dL (ref 6.0–8.3)
Total Bilirubin: 0.4 mg/dL (ref 0.2–1.2)

## 2015-02-08 NOTE — Progress Notes (Signed)
Subjective:    Patient ID: Michele Meyer, female    DOB: 02-May-1946, 69 y.o.   MRN: [id number]  HPI  69YO female presents for follow up.  Last seen 71/11/2015 - Complained of right upper quadrant and bilateral lower abdominal pain. CT abdomen showed no acute findings to explain pain.  Continues to have abdominal pain and watery diarrhea. Also notes frequent abdominal distension and bloating with belching. Also notes increased anxiety recently related to ongoing family issues. Questions if this may be contributing.  Underwent bilateral knee cartilage injection yesterday. Notes continued bilateral knee pain. Would like to take Meloxicam if possible.   Past medical, surgical, family and social history per today's encounter.  Review of Systems  Constitutional: Negative for fever, chills, appetite change, fatigue and unexpected weight change.  Eyes: Negative for visual disturbance.  Respiratory: Negative for shortness of breath.   Cardiovascular: Negative for chest pain and leg swelling.  Gastrointestinal: Positive for diarrhea and abdominal distention. Negative for nausea, vomiting, abdominal pain, constipation and blood in stool.  Musculoskeletal: Positive for arthralgias.  Skin: Negative for color change and rash.  Hematological: Negative for adenopathy. Does not bruise/bleed easily.  Psychiatric/Behavioral: Negative for sleep disturbance and dysphoric mood. The patient is nervous/anxious.        Objective:    BP 145/75 mmHg  Pulse 78  Temp(Src) 98.8 F (37.1 C) (Oral)  Ht 5' 2.1" (1.577 m)  Wt 153 lb 2 oz (69.457 kg)  BMI 27.93 kg/m2  SpO2 99% Physical Exam  Constitutional: She is oriented to person, place, and time. She appears well-developed and well-nourished. No distress.  HENT:  Head: Normocephalic and atraumatic.  Right Ear: External ear normal.  Left Ear: External ear normal.  Nose: Nose normal.  Mouth/Throat: Oropharynx is clear and moist. No oropharyngeal  exudate.  Eyes: Conjunctivae and EOM are normal. Pupils are equal, round, and reactive to light. Right eye exhibits no discharge.  Neck: Normal range of motion. Neck supple. No thyromegaly present.  Cardiovascular: Normal rate, regular rhythm, normal heart sounds and intact distal pulses.  Exam reveals no gallop and no friction rub.   No murmur heard. Pulmonary/Chest: Effort normal. No respiratory distress. She has no wheezes. She has no rales.  Abdominal: Soft. Bowel sounds are normal. She exhibits no distension and no mass. There is no tenderness. There is no rebound and no guarding.  Musculoskeletal: Normal range of motion. She exhibits no edema or tenderness.  Lymphadenopathy:    She has no cervical adenopathy.  Neurological: She is alert and oriented to person, place, and time. No cranial nerve deficit. Coordination normal.  Skin: Skin is warm and dry. No rash noted. She is not diaphoretic. No erythema. No pallor.  Psychiatric: Her speech is normal and behavior is normal. Judgment and thought content normal. Her mood appears anxious. Cognition and memory are normal.          Assessment & Plan:   Problem List Items Addressed This Visit      High   Hypertension (Chronic)    BP Readings from Last 3 Encounters:  02/08/15 145/75  01/04/15 143/72  11/26/14 132/77   BP slightly elevated today, but generally well controlled. Renal function with labs. Continue Valsartan-HCTZ and Metoprolol.      Relevant Orders   Comprehensive metabolic panel     Unprioritized   Abdominal pain, right upper quadrant - Primary    Persistent abdominal pain, bloating and diarrhea. CT abdomen was essentially normal except for diverticulosis.  GI evaluation pending. Question if she may have microscopic colitis versus IBS. Will recheck H. Pylori today. Follow up after GI evaluation.      Relevant Orders   H. pylori breath test   Aortic atherosclerosis    Aortic plaque noted on recent CT scan.  Cholesterol elevated on last check however pt declined statin medication. Will continue healthy diet and exercise as tolerated.      Toenail deformity    Chronic thickened toenails. S/p laser treatment in past for toenail fungus. Will set up podiatry evaluation.      Relevant Orders   Ambulatory referral to Podiatry       Return in about 3 months (around 05/09/2015) for Recheck.

## 2015-02-08 NOTE — Assessment & Plan Note (Signed)
Aortic plaque noted on recent CT scan. Cholesterol elevated on last check however pt declined statin medication. Will continue healthy diet and exercise as tolerated.

## 2015-02-08 NOTE — Patient Instructions (Addendum)
Labs today.  We will set up evaluation with GI and podiatry.  Follow up in 3 months

## 2015-02-08 NOTE — Progress Notes (Signed)
Pre visit review using our clinic review tool, if applicable. No additional management support is needed unless otherwise documented below in the visit note. 

## 2015-02-08 NOTE — Assessment & Plan Note (Signed)
Chronic thickened toenails. S/p laser treatment in past for toenail fungus. Will set up podiatry evaluation.

## 2015-02-08 NOTE — Assessment & Plan Note (Signed)
Persistent abdominal pain, bloating and diarrhea. CT abdomen was essentially normal except for diverticulosis. GI evaluation pending. Question if she may have microscopic colitis versus IBS. Will recheck H. Pylori today. Follow up after GI evaluation.

## 2015-02-08 NOTE — Assessment & Plan Note (Signed)
BP Readings from Last 3 Encounters:  02/08/15 145/75  01/04/15 143/72  11/26/14 132/77   BP slightly elevated today, but generally well controlled. Renal function with labs. Continue Valsartan-HCTZ and Metoprolol.

## 2015-02-09 ENCOUNTER — Encounter: Payer: Self-pay | Admitting: *Deleted

## 2015-02-09 LAB — H. PYLORI BREATH TEST: H. pylori Breath Test: NOT DETECTED

## 2015-02-23 ENCOUNTER — Ambulatory Visit (INDEPENDENT_AMBULATORY_CARE_PROVIDER_SITE_OTHER): Payer: Medicare Other | Admitting: Podiatry

## 2015-02-23 ENCOUNTER — Encounter: Payer: Self-pay | Admitting: Podiatry

## 2015-02-23 VITALS — BP 134/67 | HR 83 | Resp 16 | Ht 62.0 in | Wt 153.0 lb

## 2015-02-23 DIAGNOSIS — L603 Nail dystrophy: Secondary | ICD-10-CM | POA: Diagnosis not present

## 2015-02-23 NOTE — Progress Notes (Signed)
She presents once again for a chief complaint of painful thick dystrophic discolored hallux nails bilateral. She elected have something done to to these if at all possible. She does not want to take oral medication.  Objective: Pulses are strongly palpable bilateral. Nails are thick yellow dystrophic possibly mycotic but definitely dystrophic hallux bilateral.  Assessment: Nail dystrophy hallux bilateral possible onychomycosis.  Plan: Samples of the skin and nail were taken today send for pathologic evaluation once this returns we will consider laser therapy.

## 2015-03-08 ENCOUNTER — Telehealth: Payer: Self-pay | Admitting: Podiatry

## 2015-03-08 ENCOUNTER — Encounter: Payer: Self-pay | Admitting: Podiatry

## 2015-03-08 NOTE — Telephone Encounter (Signed)
Tried to call patient to set up appt with Dr.Hyatt to go over [place].Her number is not accepting calls at this time.

## 2015-03-09 ENCOUNTER — Ambulatory Visit (INDEPENDENT_AMBULATORY_CARE_PROVIDER_SITE_OTHER): Payer: Medicare Other | Admitting: Internal Medicine

## 2015-03-09 ENCOUNTER — Other Ambulatory Visit (INDEPENDENT_AMBULATORY_CARE_PROVIDER_SITE_OTHER): Payer: Medicare Other

## 2015-03-09 ENCOUNTER — Encounter: Payer: Self-pay | Admitting: Internal Medicine

## 2015-03-09 VITALS — BP 146/70 | HR 68 | Ht 62.0 in | Wt 150.8 lb

## 2015-03-09 DIAGNOSIS — R1011 Right upper quadrant pain: Secondary | ICD-10-CM | POA: Diagnosis not present

## 2015-03-09 DIAGNOSIS — R195 Other fecal abnormalities: Secondary | ICD-10-CM

## 2015-03-09 DIAGNOSIS — Z1211 Encounter for screening for malignant neoplasm of colon: Secondary | ICD-10-CM

## 2015-03-09 DIAGNOSIS — R109 Unspecified abdominal pain: Secondary | ICD-10-CM | POA: Diagnosis not present

## 2015-03-09 DIAGNOSIS — E785 Hyperlipidemia, unspecified: Secondary | ICD-10-CM

## 2015-03-09 DIAGNOSIS — Z8619 Personal history of other infectious and parasitic diseases: Secondary | ICD-10-CM | POA: Diagnosis not present

## 2015-03-09 LAB — TSH: TSH: 1.18 u[IU]/mL (ref 0.35–4.50)

## 2015-03-09 LAB — IGA: IGA: 129 mg/dL (ref 68–378)

## 2015-03-09 NOTE — Progress Notes (Signed)
Patient ID: Michele Meyer, female   DOB: [birth year], 69 y.o.   MRN: [id number] HPI: Michele Meyer is a 69 year old female with a past medical history of H. pylori treated initially in 1999 retreated in the summer of 2015, anxiety and depression, hypertension, hyperlipidemia and diverticulosis who is seen in consultation at the request of Dr. Gilford Rile to evaluate abdominal pain and chronic loose stools. The patient is here alone today. She states that she has had issues with right upper quadrant discomfort over the last few months but also years. She describes this as an awareness and reports she is still able to function. The pain seems to involve her epigastrium and right upper quadrant. It has no specific aggravating factors and doesn't seem to relate to eating or bowel movement. She has previously had cholecystectomy for gallstones. Alleviating factors include eating ice cream. Stress makes the pain worse and she has been under great toe stress lately. She has a sister and brother-in-law have had cancer. She lost her brother-in-law to cancer 2 days before Christmas of 2015. She's also had chronic loose stools but denies blood in her stool or melena. She reports previous fecal occult blood testing have been negative. She has never had a colonoscopy but recalls a flexible sigmoidoscopy. She is very afraid of colonoscopy because she had a best friend who died after a colonic perforation secondary to colonoscopy. She has started several medications recently namely sertraline and temazepam. He does use alprazolam on an as-needed basis for panic attacks. She uses pantoprazole 40 mg daily. She denies dysphagia or odynophagia. Occasionally she also has lower abdominal cramping type pain which seems to be relieved by defecation.  Past Medical History  Diagnosis Date  . Anxiety   . Cystocele   . Incomplete bladder emptying   . Chronic cystitis   . Stress incontinence   . Depression   . UTI (lower urinary tract  infection)   . Skin cancer   . Arthritis   . Gestational diabetes mellitus   . Endometriosis   . Glaucoma   . Uterovaginal prolapse, incomplete   . Skin cancer     basal and squamous cell  . Labile hypertension   . Dyslipidemia   . Cyst of right kidney   . Diverticulosis     Past Surgical History  Procedure Laterality Date  . Removal of first rib      bilaterally  . Prolapsed bladder      Repair Dr.Cope  . Abdominal hysterectomy    . Vein ligation and stripping    . Breast biopsy    . Tonsillectomy    . Appendectomy    . Breast lumpectomy      benign  . Pubovaginal sling    . Anterior and posterior vaginal repair      Outpatient Prescriptions Prior to Visit  Medication Sig Dispense Refill  . ALPRAZolam (XANAX) 0.5 MG tablet Take 0.5 mg by mouth as needed for anxiety or sleep.    Marland Kitchen latanoprost (XALATAN) 0.005 % ophthalmic solution Place 1 drop into both eyes.     . meloxicam (MOBIC) 15 MG tablet Take 1 tablet (15 mg total) by mouth daily. 30 tablet 0  . metoprolol succinate (TOPROL-XL) 25 MG 24 hr tablet Take 0.5 tablets (12.5 mg total) by mouth 2 (two) times daily. 90 tablet 3  . nystatin (MYCOSTATIN/NYSTOP) 100000 UNIT/GM POWD APPLY TO AFFECTED AREA 3 TIMES DAILY 15 g 3  . pantoprazole (PROTONIX) 20 MG tablet Take 2 tablets (  40 mg total) by mouth daily. 60 tablet 5  . sertraline (ZOLOFT) 100 MG tablet Take 100 mg by mouth daily.     . temazepam (RESTORIL) 15 MG capsule Take 1 capsule (15 mg total) by mouth at bedtime. 30 capsule 3  . valsartan-hydrochlorothiazide (DIOVAN-HCT) 160-25 MG per tablet TAKE 1 TABLET BY MOUTH DAILY. 30 tablet 6   No facility-administered medications prior to visit.    Allergies  Allergen Reactions  . Ace Inhibitors   . Codeine   . Erythromycin   . Penicillins Cross Reactors   . Prednisone     Headache, GI upset  . Latex     Sensitive to it but not allergic    Family History  Problem Relation Age of Onset  . Diabetes Mother   .  Hypertension Mother   . Depression Father   . Cervical cancer Mother   . Skin cancer Mother   . Hypertension Father   . Hypercholesterolemia Mother   . Hypercholesterolemia Father   . Colon cancer Neg Hx     History  Substance Use Topics  . Smoking status: Former Smoker    Quit date: 02/25/1964  . Smokeless tobacco: Never Used     Comment: smoked for two months  . Alcohol Use: Yes     Comment: rarely    ROS: As per history of present illness, otherwise negative  BP 146/70 mmHg  Pulse 68  Ht 5\' 2"  (1.575 m)  Wt 150 lb 12.8 oz (68.402 kg)  BMI 27.57 kg/m2 Constitutional: Well-developed and well-nourished. No distress. HEENT: Normocephalic and atraumatic. Oropharynx is clear and moist. No oropharyngeal exudate. Conjunctivae are normal.  No scleral icterus. Neck: Neck supple. Trachea midline. Cardiovascular: Normal rate, regular rhythm and intact distal pulses. No M/R/G Pulmonary/chest: Effort normal and breath sounds normal. No wheezing, rales or rhonchi. Abdominal: Soft, diffuse mild tenderness without rebound or guarding, nondistended. Bowel sounds active throughout. There are no masses palpable.  Extremities: no clubbing, cyanosis, or edema Lymphadenopathy: No cervical adenopathy noted. Neurological: Alert and oriented to person place and time. Skin: Skin is warm and dry. No rashes noted. Psychiatric: Normal mood and affect. Behavior is normal.  RELEVANT LABS AND IMAGING: CBC    Component Value Date/Time   WBC 7.4 11/24/2014 1228   RBC 4.36 11/24/2014 1228   HGB 12.5 11/24/2014 1228   HCT 37.8 11/24/2014 1228   PLT 293.0 11/24/2014 1228   MCV 86.6 11/24/2014 1228   MCHC 33.1 11/24/2014 1228   RDW 12.9 11/24/2014 1228   LYMPHSABS 1.2 11/24/2014 1228   MONOABS 0.7 11/24/2014 1228   EOSABS 0.3 11/24/2014 1228   BASOSABS 0.0 11/24/2014 1228    CMP     Component Value Date/Time   NA 131* 02/08/2015 1436   K 4.4 02/08/2015 1436   CL 94* 02/08/2015 1436   CO2  30 02/08/2015 1436   GLUCOSE 154* 02/08/2015 1436   BUN 16 02/08/2015 1436   CREATININE 0.92 02/08/2015 1436   CALCIUM 9.9 02/08/2015 1436   PROT 7.0 02/08/2015 1436   ALBUMIN 4.1 02/08/2015 1436   AST 19 02/08/2015 1436   ALT 20 02/08/2015 1436   ALKPHOS 98 02/08/2015 1436   BILITOT 0.4 02/08/2015 1436    CT scan of the abdomen and pelvis with contrast 01/17/2015 performed for abdominal fullness, early satiety and right upper quadrant pain.  --Impression: No acute findings in the abdomen/pelvis. Diverticulosis of the sigmoid without active inflammation. 2.9 cm simple cyst right upper pole of  the kidney, postsurgical changes.  ASSESSMENT/PLAN: 69 year old female with a past medical history of H. pylori treated initially in 1999 retreated in the summer of 2015, anxiety and depression, hypertension, hyperlipidemia and diverticulosis who is seen in consultation at the request of Dr. Gilford Rile to evaluate abdominal pain and chronic loose stools.  1. RUQ pain with hx of loose stools -- certain portion of her symptoms seem irritable in nature given that her symptoms worsen with stress. She has a history of H. pylori which we need to ensure complete eradication. Check celiac panel. Upper endoscopy recommended. We discussed the risks and benefits and she is agreeable to proceed  2. Loose stools with lower abdominal cramping pain -- also could be IBS related. She is very fearful of colonoscopy despite my reassurance that overall this is a safe test. Because of her fear colonoscopy, we discussed and I ordered a Cologuard.  We discussed how this is a very good test to exclude polyps and certainly cancers. This stool based test does not allow for [place] to rule out microscopic colitis, but her symptoms are not necessarily consistent with this because she is not having diarrhea occurring 10-15 times a day nor does it wake her from sleep. Loose stools could also relate to a side effect of recently added sertraline.  Will check stool studies including fecal white cells and elastase. If all evaluation negative, she would likely benefit greatly from rifaximin for IBS      CX:KGYJEHUD A Gilford Rile, [address] [place], [place] [address]

## 2015-03-09 NOTE — Patient Instructions (Signed)
Your physician has requested that you go to the basement for the following lab work before leaving today: TSH, Celiac panel, C Diff by PCR, O&P, stool culture, fecal WBC, fecal elastace  You have been scheduled for an endoscopy. Please follow written instructions given to you at your visit today. If you use inhalers (even only as needed), please bring them with you on the day of your procedure. Your physician has requested that you go to www.[email or web address] and enter the access code given to you at your visit today. This web site gives a general overview about your procedure. However, you should still follow specific instructions given to you by our office regarding your preparation for the procedure.  We have sent your information to [facility] to get in touch with you regarding a cologaurd test.

## 2015-03-11 ENCOUNTER — Other Ambulatory Visit: Payer: Medicare Other

## 2015-03-11 DIAGNOSIS — R195 Other fecal abnormalities: Secondary | ICD-10-CM

## 2015-03-11 DIAGNOSIS — R1011 Right upper quadrant pain: Secondary | ICD-10-CM

## 2015-03-11 LAB — TISSUE TRANSGLUTAMINASE, IGA: TISSUE TRANSGLUTAMINASE AB, IGA: 1 U/mL (ref <4)

## 2015-03-12 LAB — FECAL LACTOFERRIN, QUANT: LACTOFERRIN: NEGATIVE

## 2015-03-12 LAB — CLOSTRIDIUM DIFFICILE BY PCR: Toxigenic C. Difficile by PCR: NOT DETECTED

## 2015-03-14 LAB — OVA AND PARASITE EXAMINATION: OP: NONE SEEN

## 2015-03-15 LAB — STOOL CULTURE

## 2015-03-16 LAB — PANCREATIC ELASTASE, FECAL: Pancreatic Elastase-1, Stool: 481 mcg/g

## 2015-03-24 LAB — COLOGUARD

## 2015-04-05 ENCOUNTER — Telehealth: Payer: Self-pay | Admitting: *Deleted

## 2015-04-05 NOTE — Telephone Encounter (Signed)
Dr Hilarie Fredrickson has received patient's Cologuard test. He notes "negative test. Repeat in 3 years. If diarrhea continues, rx rifaximin 550 mg tid x 14 d. Ov next available." I have spoken to patient to advise of Dr Vena Rua recommendations. She verbalizes understanding. She states that her diarrhea has actually improved. She will call us back if diarrhea returns. She has scheduled a follow up office visit on 06/01/15 @ 11:00 am. She also is already scheduled for endoscopy with Dr Hilarie Fredrickson on 05/10/15 and will update him on her symptoms/lack thereof on that day.

## 2015-04-06 ENCOUNTER — Encounter: Payer: Self-pay | Admitting: Internal Medicine

## 2015-04-22 ENCOUNTER — Other Ambulatory Visit: Payer: Self-pay | Admitting: Internal Medicine

## 2015-05-10 ENCOUNTER — Encounter: Payer: Medicare Other | Admitting: Internal Medicine

## 2015-05-10 ENCOUNTER — Telehealth: Payer: Self-pay | Admitting: Internal Medicine

## 2015-05-10 NOTE — Telephone Encounter (Signed)
PHONE HAS BEEN OUT OF ORDER SINCE SATURDAY. ALSO, DID NOT GET LABS WAS IN [place] AND COULD NOT CALL OR RECEIVE CALLS AND DID NOT GET MY REMINDER CALL YESTERDAY.   WOULD YOU LIKE PT CHARGED THE CANCELATION FEE?  Also Michele Meyer, Pt wants to verify the orders are in the system to have her Labs done?

## 2015-05-10 NOTE — Telephone Encounter (Signed)
Dr. Hilarie Fredrickson I do not see where the pt needs labs. Please advise.

## 2015-05-10 NOTE — Telephone Encounter (Signed)
Pt did not have any other information, she was confused regarding some papers from her PCP and our office.

## 2015-05-10 NOTE — Telephone Encounter (Signed)
I am also unaware of which labs she is referring to? Maybe she can provide more info?

## 2015-05-27 ENCOUNTER — Telehealth: Payer: Self-pay | Admitting: Internal Medicine

## 2015-05-31 ENCOUNTER — Encounter: Payer: Medicare Other | Admitting: Internal Medicine

## 2015-06-01 ENCOUNTER — Ambulatory Visit: Payer: Medicare Other | Admitting: Internal Medicine

## 2015-06-03 ENCOUNTER — Other Ambulatory Visit: Payer: Self-pay | Admitting: Internal Medicine

## 2015-06-23 ENCOUNTER — Ambulatory Visit: Payer: 59 | Admitting: Psychiatry

## 2015-06-29 ENCOUNTER — Telehealth: Payer: Self-pay | Admitting: Psychiatry

## 2015-06-29 ENCOUNTER — Other Ambulatory Visit: Payer: Self-pay | Admitting: Psychiatry

## 2015-06-30 ENCOUNTER — Other Ambulatory Visit: Payer: Self-pay

## 2015-06-30 DIAGNOSIS — F331 Major depressive disorder, recurrent, moderate: Secondary | ICD-10-CM | POA: Insufficient documentation

## 2015-06-30 DIAGNOSIS — Z8719 Personal history of other diseases of the digestive system: Secondary | ICD-10-CM | POA: Insufficient documentation

## 2015-06-30 DIAGNOSIS — M1990 Unspecified osteoarthritis, unspecified site: Secondary | ICD-10-CM | POA: Insufficient documentation

## 2015-06-30 NOTE — Telephone Encounter (Signed)
received faxed  from [facility]  needed refill on ttemazepam 15mg  and alprazolam .25mg 

## 2015-07-04 ENCOUNTER — Other Ambulatory Visit: Payer: Self-pay

## 2015-07-04 NOTE — Telephone Encounter (Signed)
received two fax request for medication refill on alprazolam .25mg  and on temazepam 15 mg

## 2015-07-05 MED ORDER — TEMAZEPAM 15 MG PO CAPS: 15.0000 mg | ORAL_CAPSULE | Freq: Every day | ORAL | Status: DC

## 2015-07-05 MED ORDER — ALPRAZOLAM 0.25 MG PO TABS: 0.2500 mg | ORAL_TABLET | Freq: Two times a day (BID) | ORAL | Status: DC

## 2015-07-05 NOTE — Telephone Encounter (Signed)
Refilled meds , printed and faxed to [facility]

## 2015-07-07 ENCOUNTER — Ambulatory Visit (INDEPENDENT_AMBULATORY_CARE_PROVIDER_SITE_OTHER): Payer: 59 | Admitting: Psychiatry

## 2015-07-07 ENCOUNTER — Encounter: Payer: Self-pay | Admitting: Psychiatry

## 2015-07-07 DIAGNOSIS — F331 Major depressive disorder, recurrent, moderate: Secondary | ICD-10-CM | POA: Diagnosis not present

## 2015-07-07 DIAGNOSIS — F411 Generalized anxiety disorder: Secondary | ICD-10-CM | POA: Diagnosis not present

## 2015-07-07 MED ORDER — ALPRAZOLAM 0.25 MG PO TABS: 0.2500 mg | ORAL_TABLET | Freq: Two times a day (BID) | ORAL | Status: DC

## 2015-07-07 MED ORDER — SERTRALINE HCL 100 MG PO TABS: 100.0000 mg | ORAL_TABLET | Freq: Every day | ORAL | Status: DC

## 2015-07-07 NOTE — Progress Notes (Signed)
BH MD/PA/NP OP Progress Note  07/07/2015 10:19 AM Michele Meyer  MRN:  [id number]  Subjective:    Patient is a 69 year old married female who presented for the follow-up appointment. She reported that she is having issues with her family members and she is trying to cope with them. Patient reported that she has tried to cut down on her Zoloft to 50 mg. She is still taking the temazepam at night but it is not helping her with sleep. Patient reported that she is going for the knee surgery in October she feels that she is becoming old and does not walk as fast as the other people.  She appeared pleasant and cooperative during the interview. She currently denied having any suicidal homicidal ideations or plans. Chief Complaint:  Chief Complaint    Follow-up; Anxiety; Depression     Visit Diagnosis:     ICD-9-CM ICD-10-CM   1. MDD (major depressive disorder), recurrent episode, moderate 296.32 F33.1   2. GAD (generalized anxiety disorder) 300.02 F41.1     Past Medical History:  Past Medical History  Diagnosis Date  . Anxiety   . Cystocele   . Incomplete bladder emptying   . Chronic cystitis   . Stress incontinence   . Depression   . UTI (lower urinary tract infection)   . Skin cancer   . Arthritis   . Gestational diabetes mellitus   . Endometriosis   . Glaucoma   . Uterovaginal prolapse, incomplete   . Skin cancer     basal and squamous cell  . Labile hypertension   . Dyslipidemia   . Cyst of right kidney   . Diverticulosis     Past Surgical History  Procedure Laterality Date  . Removal of first rib      bilaterally  . Prolapsed bladder      Repair Dr.Cope  . Abdominal hysterectomy    . Vein ligation and stripping    . Breast biopsy    . Tonsillectomy    . Appendectomy    . Breast lumpectomy      benign  . Pubovaginal sling    . Anterior and posterior vaginal repair     Family History:  Family History  Problem Relation Age of Onset  . Diabetes Mother   .  Hypertension Mother   . Depression Father   . Cervical cancer Mother   . Skin cancer Mother   . Hypertension Father   . Hypercholesterolemia Mother   . Hypercholesterolemia Father   . Colon cancer Neg Hx    Social History:  History   Social History  . Marital Status: Married    Spouse Name: N/A  . Number of Children: 5  . Years of Education: N/A   Occupational History  .     Social History Main Topics  . Smoking status: Former Smoker    Quit date: 02/25/1964  . Smokeless tobacco: Never Used     Comment: smoked for two months  . Alcohol Use: Yes     Comment: rarely  . Drug Use: No  . Sexual Activity: Not on file   Other Topics Concern  . Not on file   Social History Narrative   Married, mother of 51, with at least one granddaughter who is present today.   Daily Caffeine Use:  2 cups in am;   She does not exercise routinely. Former smoker who quit in 1965.   Takes occasional alcohol beverage.   As the name is [place]  granddaughter's name is Nela Bascom   Additional History:  Lives with husband. Will have knee surgery in Oct. She is looking forward to the same.   Assessment:   Musculoskeletal: Strength & Muscle Tone: within normal limits Gait & Station: normal Patient leans: N/A  Psychiatric Specialty Exam: HPI  Review of Systems  Constitutional: Negative.  Negative for chills.  HENT: Negative.   Eyes: Negative.   Respiratory: Negative.   Gastrointestinal: Negative.   Genitourinary: Negative.   Musculoskeletal: Positive for back pain and joint pain.  Skin: Negative.   Neurological: Negative.   Endo/Heme/Allergies: Negative.   Psychiatric/Behavioral: Positive for depression. The patient is nervous/anxious and has insomnia.     There were no vitals taken for this visit.There is no weight on file to calculate BMI.  General Appearance: Casual  Eye Contact:  Fair  Speech:  Clear and Coherent  Volume:  Normal  Mood:  Anxious  Affect:  Congruent  Thought  Process:  Coherent  Orientation:  Full (Time, Place, and Person)  Thought Content:  WDL  Suicidal Thoughts:  No  Homicidal Thoughts:  No  Memory:  NA  Judgement:  Fair  Insight:  Fair  Psychomotor Activity:  Normal  Concentration:  Fair  Recall:  [employer] of Knowledge: Fair  Language: Fair  Akathisia:  No  Handed:  Right  AIMS (if indicated):  none  Assets:  Communication Skills Desire for Improvement Social Support  ADL's:  Intact  Cognition: WNL  Sleep:  Not much 4-5 hours    Is the patient at risk to self?  No. Has the patient been a risk to self in the past 6 months?  No. Has the patient been a risk to self within the distant past?  No. Is the patient a risk to others?  No. Has the patient been a risk to others in the past 6 months?  No. Has the patient been a risk to others within the distant past?  No.  Current Medications: Current Outpatient Prescriptions  Medication Sig Dispense Refill  . ALPRAZolam (XANAX) 0.25 MG tablet Take 1 tablet (0.25 mg total) by mouth 2 (two) times daily. 15 days supply 30 tablet 0  . ciprofloxacin (CIPRO) 500 MG tablet Take by mouth.    . latanoprost (XALATAN) 0.005 % ophthalmic solution Place 1 drop into both eyes.     . meloxicam (MOBIC) 15 MG tablet TAKE 1 TABLET BY MOUTH DAILY. 30 tablet 0  . metoprolol succinate (TOPROL-XL) 25 MG 24 hr tablet TAKE 1/2 TABLET BY MOUTH TWICE A DAY. 90 tablet 0  . Multiple Vitamin tablet Take by mouth.    . nystatin (MYCOSTATIN/NYSTOP) 100000 UNIT/GM POWD APPLY TO AFFECTED AREA 3 TIMES DAILY 15 g 3  . pantoprazole (PROTONIX) 20 MG tablet Take 2 tablets (40 mg total) by mouth daily. 60 tablet 5  . sertraline (ZOLOFT) 100 MG tablet Take 100 mg by mouth daily.     . temazepam (RESTORIL) 15 MG capsule Take 1 capsule (15 mg total) by mouth at bedtime. 30 capsule 3  . valsartan-hydrochlorothiazide (DIOVAN-HCT) 160-25 MG per tablet TAKE 1 TABLET BY MOUTH DAILY. 30 tablet 6   No current facility-administered  medications for this visit.    Medical Decision Making:  Established Problem, Stable/Improving (1) and Review of Psycho-Social Stressors (1)  Treatment Plan Summary:Medication management  Advised patient to take Zoloft 50 mg twice a day and she will be given prescription for the same Will continue on Xanax 0.25 mg  twice a day and she will take one pill at night to help with sleep Discontinue temazepam at this time and she agreed with the plan Follow-up in 2 months or earlier depending on her symptoms   More than 50% of the time spent in psychoeducation, counseling and coordination of care.    This note was generated in part or whole with voice recognition software. Voice regonition is usually quite accurate but there are transcription errors that can and very often do occur. I apologize for any typographical errors that were not detected and corrected.    Rainey Pines 07/07/2015, 10:19 AM

## 2015-07-08 ENCOUNTER — Other Ambulatory Visit (INDEPENDENT_AMBULATORY_CARE_PROVIDER_SITE_OTHER): Payer: Medicare Other

## 2015-07-08 ENCOUNTER — Telehealth: Payer: Self-pay | Admitting: *Deleted

## 2015-07-08 DIAGNOSIS — Z0000 Encounter for general adult medical examination without abnormal findings: Secondary | ICD-10-CM

## 2015-07-08 DIAGNOSIS — E785 Hyperlipidemia, unspecified: Secondary | ICD-10-CM | POA: Diagnosis not present

## 2015-07-08 DIAGNOSIS — I10 Essential (primary) hypertension: Secondary | ICD-10-CM

## 2015-07-08 LAB — CBC WITH DIFFERENTIAL/PLATELET
Basophils Absolute: 0 10*3/uL (ref 0.0–0.1)
Basophils Relative: 0.5 % (ref 0.0–3.0)
EOS PCT: 10.3 % — AB (ref 0.0–5.0)
Eosinophils Absolute: 0.7 10*3/uL (ref 0.0–0.7)
HCT: 34.8 % — ABNORMAL LOW (ref 36.0–46.0)
Hemoglobin: 11.8 g/dL — ABNORMAL LOW (ref 12.0–15.0)
Lymphocytes Relative: 18.8 % (ref 12.0–46.0)
Lymphs Abs: 1.3 10*3/uL (ref 0.7–4.0)
MCHC: 34.1 g/dL (ref 30.0–36.0)
MCV: 85.9 fl (ref 78.0–100.0)
MONO ABS: 0.6 10*3/uL (ref 0.1–1.0)
Monocytes Relative: 7.8 % (ref 3.0–12.0)
NEUTROS PCT: 62.6 % (ref 43.0–77.0)
Neutro Abs: 4.4 10*3/uL (ref 1.4–7.7)
Platelets: 226 10*3/uL (ref 150.0–400.0)
RBC: 4.04 Mil/uL (ref 3.87–5.11)
RDW: 12.6 % (ref 11.5–15.5)
WBC: 7.1 10*3/uL (ref 4.0–10.5)

## 2015-07-08 LAB — COMPREHENSIVE METABOLIC PANEL
ALT: 13 U/L (ref 0–35)
AST: 15 U/L (ref 0–37)
Albumin: 3.9 g/dL (ref 3.5–5.2)
Alkaline Phosphatase: 88 U/L (ref 39–117)
BUN: 20 mg/dL (ref 6–23)
CHLORIDE: 94 meq/L — AB (ref 96–112)
CO2: 29 meq/L (ref 19–32)
CREATININE: 1 mg/dL (ref 0.40–1.20)
Calcium: 9.3 mg/dL (ref 8.4–10.5)
GFR: 58.15 mL/min — AB (ref >60.00)
Glucose, Bld: 112 mg/dL — ABNORMAL HIGH (ref 70–99)
POTASSIUM: 4.3 meq/L (ref 3.5–5.1)
SODIUM: 129 meq/L — AB (ref 135–145)
Total Bilirubin: 0.5 mg/dL (ref 0.2–1.2)
Total Protein: 6.5 g/dL (ref 6.0–8.3)

## 2015-07-08 LAB — LIPID PANEL
Cholesterol: 258 mg/dL — ABNORMAL HIGH (ref 0–200)
HDL: 80.5 mg/dL (ref >39.00)
LDL CALC: 146 mg/dL — AB (ref 0–99)
NonHDL: 177.5
Total CHOL/HDL Ratio: 3
Triglycerides: 157 mg/dL — ABNORMAL HIGH (ref 0.0–149.0)
VLDL: 31.4 mg/dL (ref 0.0–40.0)

## 2015-07-08 LAB — TSH: TSH: 1.1 u[IU]/mL (ref 0.35–4.50)

## 2015-07-08 LAB — HEMOGLOBIN A1C: Hgb A1c MFr Bld: 5.4 % (ref 4.6–6.5)

## 2015-07-08 NOTE — Telephone Encounter (Signed)
Go ahead and send those labs since the blood has been drawn already.

## 2015-07-08 NOTE — Telephone Encounter (Signed)
FAXED TO DR Wynelle Link SURGERY 10/24/15 RIGHT  TKA- MEDIAL AND LATERAL W/WO PATELLA RESURFACING  PER DR HARDING PATIENT DOES NOT NEED CARDIAC CLEARANCE ,PCP CAN CLEAR  PATIENT PATIENT HAS NOT SEEN SINCE JAN 2015  DOES NOT NEED CARDIAC EVALUATION IF NO SX.

## 2015-07-08 NOTE — Telephone Encounter (Signed)
I have not ordered any labs for this patient

## 2015-07-08 NOTE — Telephone Encounter (Signed)
Labs and dx?  

## 2015-07-08 NOTE — Telephone Encounter (Signed)
If this is for a physical, then CBC, CMP, lipids, TSH, A1c

## 2015-07-08 NOTE — Telephone Encounter (Signed)
This is what the appointment note says: blood pressure problems - place on leg - gets hot and cold/ac  Do you still want orders?

## 2015-07-08 NOTE — Telephone Encounter (Signed)
Pt has an appointment with you on the 20th

## 2015-07-13 ENCOUNTER — Ambulatory Visit: Payer: Medicare Other | Admitting: Internal Medicine

## 2015-07-14 ENCOUNTER — Telehealth: Payer: Self-pay

## 2015-07-14 NOTE — Telephone Encounter (Signed)
The pt called hoping to get test results on her blood work.

## 2015-07-15 NOTE — Telephone Encounter (Signed)
Notified pt of lab results which were intended to be discussed at her missed appt on 07/13/15.  Pt was transferred to schedulers to schedule an appt

## 2015-07-18 ENCOUNTER — Encounter: Payer: Self-pay | Admitting: *Deleted

## 2015-07-20 ENCOUNTER — Telehealth: Payer: Self-pay

## 2015-07-20 ENCOUNTER — Other Ambulatory Visit: Payer: Self-pay | Admitting: Internal Medicine

## 2015-07-20 NOTE — Telephone Encounter (Signed)
Last OV 2.16.16.  Please advise refill.

## 2015-07-20 NOTE — Telephone Encounter (Signed)
Dismissal process started in HIM 

## 2015-07-21 ENCOUNTER — Telehealth: Payer: Self-pay | Admitting: Internal Medicine

## 2015-07-21 NOTE — Telephone Encounter (Signed)
Patient dismissed from [facility] by Anderson Malta A. Gilford Rile, MD, effective 07/18/15. Dismissal letter sent out 01/04/72 by certified / registered mail. fbg  Received signed domestic return receipt verifying delivery of certified letter on 5/67/01. Article number [id number] fbg.

## 2015-07-26 ENCOUNTER — Telehealth: Payer: Self-pay | Admitting: *Deleted

## 2015-07-26 NOTE — Telephone Encounter (Signed)
I returned the patient's call . She was very concerned that she was discharged at her age. She did not want to look for a new primary care. She stated what was in the dismissal letter. She was aware that it was due to no show's . She also was aware that she had no showed with other physician's in other [employer] practices. She was concerned that the dismissal was for them as well . I informed her that it was for [facility] offices only and that she could continue to see her other physicians with in [facility]. I did inform the patient that if she wanted to see someone local that [facility] would be an option and that they could access her record from Care everywhere and if she were needing her medical records printed she could sign a Release of Information to have her records sent to another doctors office. She was appreciative for the information and asked if she could have her labs results mailed to her home . I agreed to mail those results to the patient.

## 2015-07-26 NOTE — Telephone Encounter (Signed)
Labs show mild anemia and elevated cholesterol. Other labs are stable. She will need additional evaluation to work up anemia. I would recommend that she establish care with a new provider. Her records will be visible to providers at [facility], which might be a good nearby option.

## 2015-07-26 NOTE — Telephone Encounter (Signed)
Pt called states she is scheduled a Pre Op appoint Sep 27, 2015.  Pt states she needs a follow up from her abnormal labs prior to her surgery.  Pt further stated she wanted to speak with the Administrator, transferred to Nancy's VM.

## 2015-08-01 ENCOUNTER — Other Ambulatory Visit: Payer: Self-pay | Admitting: Internal Medicine

## 2015-08-01 NOTE — Telephone Encounter (Signed)
Patient has been dismissed from this department 

## 2015-08-02 NOTE — Telephone Encounter (Signed)
Temazepam was d/c at her appointment and she was given prescription of Xanax.

## 2015-08-16 ENCOUNTER — Encounter: Payer: Self-pay | Admitting: Family Medicine

## 2015-08-16 ENCOUNTER — Ambulatory Visit (INDEPENDENT_AMBULATORY_CARE_PROVIDER_SITE_OTHER): Payer: Medicare Other | Admitting: Family Medicine

## 2015-08-16 VITALS — BP 132/60 | HR 77 | Temp 98.6°F | Ht 61.4 in | Wt 152.6 lb

## 2015-08-16 DIAGNOSIS — Z1239 Encounter for other screening for malignant neoplasm of breast: Secondary | ICD-10-CM | POA: Diagnosis not present

## 2015-08-16 DIAGNOSIS — F331 Major depressive disorder, recurrent, moderate: Secondary | ICD-10-CM | POA: Diagnosis not present

## 2015-08-16 DIAGNOSIS — I10 Essential (primary) hypertension: Secondary | ICD-10-CM

## 2015-08-16 DIAGNOSIS — E785 Hyperlipidemia, unspecified: Secondary | ICD-10-CM | POA: Diagnosis not present

## 2015-08-16 DIAGNOSIS — M170 Bilateral primary osteoarthritis of knee: Secondary | ICD-10-CM | POA: Diagnosis not present

## 2015-08-16 MED ORDER — VALSARTAN-HYDROCHLOROTHIAZIDE 160-25 MG PO TABS: 1.0000 | ORAL_TABLET | Freq: Every day | ORAL | Status: DC

## 2015-08-16 NOTE — Assessment & Plan Note (Signed)
Stable at this time. Continue current regimen. Await records.

## 2015-08-16 NOTE — Progress Notes (Signed)
BP 132/60 mmHg  Pulse 77  Temp(Src) 98.6 F (37 C)  Ht 5' 1.4" (1.56 m)  Wt 152 lb 9.6 oz (69.219 kg)  BMI 28.44 kg/m2  SpO2 98%   Subjective:    Patient ID: Michele Meyer, female    DOB: [birth year], 69 y.o.   MRN: [id number]  HPI: Michele Meyer is a 69 y.o. female  Who presents today to establish care and with the following:    Chief Complaint  Patient presents with  . Knee Pain    patient is scheduled for total knee replacement on October 10,2016, she would like to discuss with you if there is different options.    HYPERTENSION / HYPERLIPIDEMIA Satisfied with current treatment? yes Duration of hypertension: chronic BP monitoring frequency: a few times a week BP medication side effects: no Duration of hyperlipidemia: chronic Aspirin: no Recent stressors: yes Recurrent headaches: no Visual changes: no Palpitations: no Dyspnea: no Chest pain: no Lower extremity edema: no Dizzy/lightheaded: no  ANEMIA Anemia status: uncontrolled Etiology of anemia: unknown Severity of anemia: mild Fatigue: yes Decreased exercise tolerance: no  Dyspnea on exertion: no Palpitations: yes Bleeding: no Pica: no  Has had the cortisone injections, synvisc, PT, meloxicam. Isn't sure she wants to get her knee replaced. Wants to make sure she was diagnosed with the right kind of arthritis. Is very concerned about this.   Has been quite fatigued, but has not been depressed. Concerned about this. Has had quite a bit of death in the family recently.   Relevant past medical, surgical, family and social history reviewed and updated as indicated. Interim medical history since our last visit reviewed. Allergies and medications reviewed and updated.  Review of Systems  Constitutional: Negative.   Respiratory: Negative.   Cardiovascular: Negative.   Psychiatric/Behavioral: Negative.     Per HPI unless specifically indicated above     Objective:    BP 132/60 mmHg  Pulse 77  Temp(Src)  98.6 F (37 C)  Ht 5' 1.4" (1.56 m)  Wt 152 lb 9.6 oz (69.219 kg)  BMI 28.44 kg/m2  SpO2 98%  Wt Readings from Last 3 Encounters:  08/16/15 152 lb 9.6 oz (69.219 kg)  03/09/15 150 lb 12.8 oz (68.402 kg)  02/23/15 153 lb (69.4 kg)    Physical Exam  Constitutional: She is oriented to person, place, and time. She appears well-developed and well-nourished. No distress.  HENT:  Head: Normocephalic and atraumatic.  Right Ear: Hearing normal.  Left Ear: Hearing normal.  Nose: Nose normal.  Eyes: Conjunctivae and lids are normal. Right eye exhibits no discharge. Left eye exhibits no discharge. No scleral icterus.  Cardiovascular: Normal rate, regular rhythm, normal heart sounds and intact distal pulses.  Exam reveals no gallop and no friction rub.   No murmur heard. Pulmonary/Chest: Effort normal and breath sounds normal. No respiratory distress. She has no wheezes. She has no rales. She exhibits no tenderness.  Musculoskeletal: Normal range of motion.  Neurological: She is alert and oriented to person, place, and time.  Skin: Skin is warm, dry and intact. No rash noted. No erythema. No pallor.  Psychiatric: She has a normal mood and affect. Her speech is normal and behavior is normal. Judgment and thought content normal. Cognition and memory are normal.  Nursing note and vitals reviewed.   Results for orders placed or performed in visit on 07/08/15  Lipid panel  Result Value Ref Range   Cholesterol 258 (H) 0 - 200 mg/dL  Triglycerides 157.0 (H) 0.0 - 149.0 mg/dL   HDL 80.50 >39.00 mg/dL   VLDL 31.4 0.0 - 40.0 mg/dL   LDL Cholesterol 146 (H) 0 - 99 mg/dL   Total CHOL/HDL Ratio 3    NonHDL 177.50   Comprehensive metabolic panel  Result Value Ref Range   Sodium 129 (L) 135 - 145 mEq/L   Potassium 4.3 3.5 - 5.1 mEq/L   Chloride 94 (L) 96 - 112 mEq/L   CO2 29 19 - 32 mEq/L   Glucose, Bld 112 (H) 70 - 99 mg/dL   BUN 20 6 - 23 mg/dL   Creatinine, Ser 1.00 0.40 - 1.20 mg/dL   Total  Bilirubin 0.5 0.2 - 1.2 mg/dL   Alkaline Phosphatase 88 39 - 117 U/L   AST 15 0 - 37 U/L   ALT 13 0 - 35 U/L   Total Protein 6.5 6.0 - 8.3 g/dL   Albumin 3.9 3.5 - 5.2 g/dL   Calcium 9.3 8.4 - 10.5 mg/dL   GFR 58.15 (L) >60.00 mL/min  Hemoglobin A1c  Result Value Ref Range   Hgb A1c MFr Bld 5.4 4.6 - 6.5 %  CBC with Differential/Platelet  Result Value Ref Range   WBC 7.1 4.0 - 10.5 K/uL   RBC 4.04 3.87 - 5.11 Mil/uL   Hemoglobin 11.8 (L) 12.0 - 15.0 g/dL   HCT 34.8 (L) 36.0 - 46.0 %   MCV 85.9 78.0 - 100.0 fl   MCHC 34.1 30.0 - 36.0 g/dL   RDW 12.6 11.5 - 15.5 %   Platelets 226.0 150.0 - 400.0 K/uL   Neutrophils Relative % 62.6 43.0 - 77.0 %   Lymphocytes Relative 18.8 12.0 - 46.0 %   Monocytes Relative 7.8 3.0 - 12.0 %   Eosinophils Relative 10.3 (H) 0.0 - 5.0 %   Basophils Relative 0.5 0.0 - 3.0 %   Neutro Abs 4.4 1.4 - 7.7 K/uL   Lymphs Abs 1.3 0.7 - 4.0 K/uL   Monocytes Absolute 0.6 0.1 - 1.0 K/uL   Eosinophils Absolute 0.7 0.0 - 0.7 K/uL   Basophils Absolute 0.0 0.0 - 0.1 K/uL  TSH  Result Value Ref Range   TSH 1.10 0.35 - 4.50 uIU/mL      Assessment & Plan:   Problem List Items Addressed This Visit      Cardiovascular and Mediastinum   Hypertension (Chronic)    Better on recheck. Continue to monitor. Continue current regimen.       Relevant Medications   valsartan-hydrochlorothiazide (DIOVAN-HCT) 160-25 MG per tablet     Musculoskeletal and Integument   Osteoarthritis    Will check on labs done previously. Await records        Other   Hyperlipidemia (Chronic)    Work on diet and exercise and recheck in 6 months.       Relevant Medications   valsartan-hydrochlorothiazide (DIOVAN-HCT) 160-25 MG per tablet   Screening for breast cancer    Needs mammogram. Ordered today.       Depression, major, recurrent, moderate    Stable at this time. Continue current regimen. Await records.        Other Visit Diagnoses    Breast cancer screening    -   Primary    Relevant Orders    MM DIGITAL SCREENING BILATERAL        Follow up plan: Return in about 4 weeks (around 09/13/2015) for Records release please.

## 2015-08-16 NOTE — Patient Instructions (Signed)
Arthritis, Nonspecific Arthritis is pain, redness, warmth, or puffiness (inflammation) of a joint. The joint may be stiff or hurt when you move it. One or more joints may be affected. There are many types of arthritis. Your doctor may not know what type you have right away. The most common cause of arthritis is wear and tear on the joint (osteoarthritis). HOME CARE   Only take medicine as told by your doctor.  Rest the joint as much as possible.  Raise (elevate) your joint if it is puffy.  Use crutches if the painful joint is in your leg.  Drink enough fluids to keep your pee (urine) clear or pale yellow.  Follow your doctor's diet instructions.  Use cold packs for very bad joint pain for 10 to 15 minutes every hour. Ask your doctor if it is okay for you to use hot packs.  Exercise as told by your doctor.  Take a warm shower if you have stiffness in the morning.  Move your sore joints throughout the day. GET HELP RIGHT AWAY IF:   You have a fever.  You have very bad joint pain, puffiness, or redness.  You have many joints that are painful and puffy.  You are not getting better with treatment.  You have very bad back pain or leg weakness.  You cannot control when you poop (bowel movement) or pee (urinate).  You do not feel better in 24 hours or are getting worse.  You are having side effects from your medicine. MAKE SURE YOU:   Understand these instructions.  Will watch your condition.  Will get help right away if you are not doing well or get worse. Document Released: 03/06/2010 Document Revised: 06/10/2012 Document Reviewed: 03/06/2010 [facility] Patient Information 2015 [place], [place]. This information is not intended to replace advice given to you by your health care provider. Make sure you discuss any questions you have with your health care provider. Iron Deficiency Anemia Anemia is when you have a low number of healthy red blood cells. It is often caused by too  little iron. This is called iron deficiency anemia. It may make you tired and short of breath. HOME CARE   Take iron as told by your doctor.  Take vitamins as told by your doctor.  Eat foods that have iron in them. This includes liver, lean beef, whole-grain bread, eggs, dried fruit, and dark green leafy vegetables. GET HELP RIGHT AWAY IF:  You pass out (faint).  You have chest pain.  You feel sick to your stomach (nauseous) or throw up (vomit).  You get very short of breath with activity.  You are weak.  You have a fast heartbeat.  You start to sweat for no reason.  You become light-headed when getting up from a chair or bed. MAKE SURE YOU:  Understand these instructions.  Will watch your condition.  Will get help right away if you are not doing well or get worse. Document Released: 01/12/2011 Document Revised: 12/15/2013 Document Reviewed: 08/17/2013 [facility] Patient Information 2015 [place], [place]. This information is not intended to replace advice given to you by your health care provider. Make sure you discuss any questions you have with your health care provider.

## 2015-08-16 NOTE — Assessment & Plan Note (Signed)
Better on recheck. Continue to monitor. Continue current regimen.

## 2015-08-16 NOTE — Assessment & Plan Note (Signed)
Work on diet and exercise and recheck in 6 months.

## 2015-08-16 NOTE — Assessment & Plan Note (Signed)
Will check on labs done previously. Await records

## 2015-08-16 NOTE — Assessment & Plan Note (Signed)
Needs mammogram. Ordered today.

## 2015-08-26 ENCOUNTER — Other Ambulatory Visit: Payer: Self-pay | Admitting: Internal Medicine

## 2015-08-30 ENCOUNTER — Telehealth: Payer: Self-pay

## 2015-08-30 NOTE — Telephone Encounter (Signed)
Rx Refill Request  Meloxicam  Metoprolol  Nystatin Powder   [employer]

## 2015-08-31 MED ORDER — NYSTATIN 100000 UNIT/GM EX POWD: CUTANEOUS | Status: DC

## 2015-08-31 NOTE — Telephone Encounter (Signed)
Last GFR was less than 60 (stage 3 CKD); I would not recommend that she continue the meloxicam; I understand she is awaiting knee replacement, but I'm going to recommend she stop the meloxicam and use tylenol up to 3000 mg per day (per package directions) and turmeric Okay for nystatin The metoprolol doesn't make sense to me; please clarify with pharmacy if she takes the LONG acting succinate twice a day or if it is supposed to be the tartrate form; we need to make sure dose and type and instructions are correct first please; thanks

## 2015-09-01 MED ORDER — METOPROLOL SUCCINATE ER 25 MG PO TB24: 12.5000 mg | ORAL_TABLET | Freq: Two times a day (BID) | ORAL | Status: DC

## 2015-09-01 NOTE — Telephone Encounter (Signed)
Thank you so much; Rx sent 

## 2015-09-01 NOTE — Telephone Encounter (Signed)
ML: I called and spoke with the pharmacy, she is on the Succinate 25mg  ER 1/2 tablet twice a a day   Patient notified of Dr.Lada's response.

## 2015-09-06 ENCOUNTER — Ambulatory Visit: Payer: Self-pay | Admitting: Orthopedic Surgery

## 2015-09-06 ENCOUNTER — Encounter: Payer: Self-pay | Admitting: Psychiatry

## 2015-09-06 ENCOUNTER — Ambulatory Visit (INDEPENDENT_AMBULATORY_CARE_PROVIDER_SITE_OTHER): Payer: 59 | Admitting: Psychiatry

## 2015-09-06 VITALS — BP 128/78 | HR 96 | Temp 99.3°F | Ht 61.5 in | Wt 151.4 lb

## 2015-09-06 DIAGNOSIS — F330 Major depressive disorder, recurrent, mild: Secondary | ICD-10-CM | POA: Diagnosis not present

## 2015-09-06 MED ORDER — ALPRAZOLAM 0.25 MG PO TABS: 0.2500 mg | ORAL_TABLET | Freq: Two times a day (BID) | ORAL | Status: DC

## 2015-09-06 MED ORDER — SERTRALINE HCL 100 MG PO TABS: 100.0000 mg | ORAL_TABLET | Freq: Every day | ORAL | Status: DC

## 2015-09-06 NOTE — Progress Notes (Signed)
Preoperative surgical orders have been place into the Epic hospital system for Michele Meyer on 09/06/2015, 12:26 PM  by Mickel Crow for surgery on 10-03-2015.  Preop Total Knee orders including Experal, IV Tylenol, and IV Decadron as long as there are no contraindications to the above medications. Arlee Muslim, PA-C

## 2015-09-06 NOTE — Progress Notes (Signed)
BH MD/PA/NP OP Progress Note  09/06/2015 10:25 AM Michele Meyer  MRN:  [id number]  Subjective:    Patient is a 69 year old married female who presented for the follow-up appointment. She reported that she is she is going to have her knee surgery done next month. Patient reported that she is feeling very disappointed as she was recently fired by her primary care physician at [facility]. She has been following with Dr. Gilford Rile for the past 7 years and she missed one of her appointments due to issues with her son. Patient reported that she received a registered letter from their practice and now she has found another physician. Patient reported that she is anxious about going for her knee replacement surgery. She has been taking Zoloft 50 mg twice daily and is also taking Xanax 1-1/2 pill at bedtime to help her with sleep. She reported that she is motivated to complete the physical therapy. She reported that her depressive symptoms are  improving. She currently denied having any suicidal ideations or plans  Chief Complaint:  Chief Complaint    Follow-up; Medication Refill; Anxiety     Visit Diagnosis:   No diagnosis found.  Past Medical History:  Past Medical History  Diagnosis Date  . Anxiety   . Cystocele   . Incomplete bladder emptying   . Chronic cystitis   . Stress incontinence   . Depression   . UTI (lower urinary tract infection)   . Skin cancer   . Arthritis   . Gestational diabetes mellitus   . Endometriosis   . Glaucoma   . Uterovaginal prolapse, incomplete   . Skin cancer     basal and squamous cell  . Labile hypertension   . Dyslipidemia   . Cyst of right kidney   . Diverticulosis     Past Surgical History  Procedure Laterality Date  . Removal of first rib      bilaterally  . Prolapsed bladder      Repair Dr.Cope  . Abdominal hysterectomy    . Vein ligation and stripping    . Breast biopsy    . Tonsillectomy    . Appendectomy    . Breast lumpectomy     benign  . Pubovaginal sling    . Anterior and posterior vaginal repair     Family History:  Family History  Problem Relation Age of Onset  . Diabetes Mother   . Hypertension Mother   . Cervical cancer Mother   . Skin cancer Mother   . Hypercholesterolemia Mother   . Heart disease Mother   . Kidney disease Mother   . Depression Father   . Hypertension Father   . Hypercholesterolemia Father   . Colon cancer Neg Hx    Social History:  Social History   Social History  . Marital Status: Married    Spouse Name: N/A  . Number of Children: 5  . Years of Education: N/A   Occupational History  .     Social History Main Topics  . Smoking status: Former Smoker    Quit date: 02/25/1964  . Smokeless tobacco: Never Used     Comment: smoked for two months  . Alcohol Use: Yes     Comment: rarely  . Drug Use: No  . Sexual Activity: Not Currently   Other Topics Concern  . None   Social History Narrative   Married, mother of 70, with at least one granddaughter who is present today.   Daily Caffeine Use:  2 cups in am;   She does not exercise routinely. Former smoker who quit in 1965.   Takes occasional alcohol beverage.   As the name is [employer] granddaughter's name is Sanii Kukla   Additional History:  Lives with husband. Will have knee surgery in Oct. She is looking forward to the same.   Assessment:   Musculoskeletal: Strength & Muscle Tone: within normal limits Gait & Station: normal Patient leans: N/A  Psychiatric Specialty Exam: Anxiety Symptoms include insomnia and nervous/anxious behavior.      Review of Systems  Constitutional: Negative.  Negative for chills.  HENT: Negative.   Eyes: Negative.   Respiratory: Negative.   Gastrointestinal: Negative.   Genitourinary: Negative.   Musculoskeletal: Positive for back pain and joint pain.  Skin: Negative.   Neurological: Negative.   Endo/Heme/Allergies: Negative.   Psychiatric/Behavioral: Positive for  depression. The patient is nervous/anxious and has insomnia.     Blood pressure 128/78, pulse 96, temperature 99.3 F (37.4 C), temperature source Tympanic, height 5' 1.5" (1.562 m), weight 151 lb 6.4 oz (68.675 kg), SpO2 95 %.Body mass index is 28.15 kg/(m^2).  General Appearance: Casual  Eye Contact:  Fair  Speech:  Clear and Coherent  Volume:  Normal  Mood:  Anxious  Affect:  Congruent  Thought Process:  Coherent  Orientation:  Full (Time, Place, and Person)  Thought Content:  WDL  Suicidal Thoughts:  No  Homicidal Thoughts:  No  Memory:  NA  Judgement:  Fair  Insight:  Fair  Psychomotor Activity:  Normal  Concentration:  Fair  Recall:  [employer] of Knowledge: Fair  Language: Fair  Akathisia:  No  Handed:  Right  AIMS (if indicated):  none  Assets:  Communication Skills Desire for Improvement Social Support  ADL's:  Intact  Cognition: WNL  Sleep:  Not much 4-5 hours    Is the patient at risk to self?  No. Has the patient been a risk to self in the past 6 months?  No. Has the patient been a risk to self within the distant past?  No. Is the patient a risk to others?  No. Has the patient been a risk to others in the past 6 months?  No. Has the patient been a risk to others within the distant past?  No.  Current Medications: Current Outpatient Prescriptions  Medication Sig Dispense Refill  . ALPRAZolam (XANAX) 0.25 MG tablet Take 1 tablet (0.25 mg total) by mouth 2 (two) times daily. 15 days supply 60 tablet 1  . latanoprost (XALATAN) 0.005 % ophthalmic solution Place 1 drop into both eyes.     . meloxicam (MOBIC) 15 MG tablet     . metoprolol succinate (TOPROL-XL) 25 MG 24 hr tablet Take 0.5 tablets (12.5 mg total) by mouth every 12 (twelve) hours. 90 tablet 0  . nystatin (MYCOSTATIN/NYSTOP) 100000 UNIT/GM POWD APPLY TO AFFECTED AREA 3 TIMES DAILY 15 g 0  . pantoprazole (PROTONIX) 20 MG tablet Take 2 tablets (40 mg total) by mouth daily. 60 tablet 5  . sertraline  (ZOLOFT) 100 MG tablet Take 1 tablet (100 mg total) by mouth daily. 90 tablet 1  . valsartan-hydrochlorothiazide (DIOVAN-HCT) 160-25 MG per tablet Take 1 tablet by mouth daily. 30 tablet 6   No current facility-administered medications for this visit.    Medical Decision Making:  Established Problem, Stable/Improving (1) and Review of Psycho-Social Stressors (1)  Treatment Plan Summary:Medication management  Advised patient to take Zoloft 50 mg twice a  day and she will be given prescription for the same Will continue on Xanax 0.25 mg twice a day and she will take one and half  pill at night to help with sleep  Follow-up in 3 months or earlier depending on her symptoms   More than 50% of the time spent in psychoeducation, counseling and coordination of care.    This note was generated in part or whole with voice recognition software. Voice regonition is usually quite accurate but there are transcription errors that can and very often do occur. I apologize for any typographical errors that were not detected and corrected.    Rainey Pines 09/06/2015, 10:25 AM

## 2015-09-08 NOTE — Telephone Encounter (Signed)
Pt is looking for a letter to get food stamps related to her medical conditions. Will advise her to seek help from PCP.

## 2015-09-15 ENCOUNTER — Telehealth: Payer: Self-pay

## 2015-09-15 MED ORDER — PANTOPRAZOLE SODIUM 20 MG PO TBEC: 40.0000 mg | DELAYED_RELEASE_TABLET | Freq: Every day | ORAL | Status: DC

## 2015-09-15 NOTE — Telephone Encounter (Signed)
Rx sent to her pharmacy 

## 2015-09-15 NOTE — Telephone Encounter (Signed)
Refill Request: Pantoprazole 20mg  Two tablets daily.

## 2015-09-16 ENCOUNTER — Ambulatory Visit (INDEPENDENT_AMBULATORY_CARE_PROVIDER_SITE_OTHER): Payer: Medicare Other | Admitting: Family Medicine

## 2015-09-16 ENCOUNTER — Encounter: Payer: Self-pay | Admitting: Family Medicine

## 2015-09-16 VITALS — BP 138/68 | HR 71 | Temp 98.8°F | Ht 61.7 in | Wt 150.0 lb

## 2015-09-16 DIAGNOSIS — H6983 Other specified disorders of Eustachian tube, bilateral: Secondary | ICD-10-CM | POA: Diagnosis not present

## 2015-09-16 DIAGNOSIS — Z7189 Other specified counseling: Secondary | ICD-10-CM

## 2015-09-16 DIAGNOSIS — F419 Anxiety disorder, unspecified: Secondary | ICD-10-CM

## 2015-09-16 DIAGNOSIS — H6980 Other specified disorders of Eustachian tube, unspecified ear: Secondary | ICD-10-CM | POA: Insufficient documentation

## 2015-09-16 MED ORDER — FLUTICASONE PROPIONATE 50 MCG/ACT NA SUSP: 2.0000 | Freq: Every day | NASAL | Status: DC

## 2015-09-16 MED ORDER — MELOXICAM 15 MG PO TABS: 15.0000 mg | ORAL_TABLET | Freq: Every day | ORAL | Status: DC

## 2015-09-16 NOTE — Progress Notes (Signed)
BP 138/68 mmHg  Pulse 71  Temp(Src) 98.8 F (37.1 C)  Ht 5' 1.7" (1.567 m)  Wt 150 lb (68.04 kg)  BMI 27.71 kg/m2  SpO2 97%   Subjective:    Patient ID: Michele Meyer, female    DOB: 03/20/1946, 69 y.o.   MRN: [id number]  HPI: Michele Meyer is a 69 y.o. female  Chief Complaint  Patient presents with  . surgical clearance   States that she is in constant pain and very anxious. She is going to have her R knee replaced on 10/03/15. Going on October 4th and 5th for preop. Having blood work with them. Going on the 5th for information session on TKR. She has been very anxious about her surgery. Feeling really nervous about doing it and worried about walking out of it. Otherwise feeling well without any other concerns or complaints at this time. Does not have a form from her orthopedist. States that she never got one.   Has not had problems with anesthesia before, but when she wakes up from surgery, she does tend to vomit but the last time she had surgery they kept it under control and she did well with it.  EAG CLOGGED Duration: chronic Involved ear(s):  bilateral Sensation of feeling clogged/plugged: yes Decreased/muffled hearing:yes Ear pain: no Fever: no Otorrhea: no Hearing loss: no Upper respiratory infection symptoms: no Using Q-Tips: no Status: stable History of cerumenosis: no Treatments attempted: none   Relevant past medical, surgical, family and social history reviewed and updated as indicated. Interim medical history since our last visit reviewed. Allergies and medications reviewed and updated.  Review of Systems  Constitutional: Negative.   Respiratory: Negative.   Cardiovascular: Negative.   Gastrointestinal: Negative.   Musculoskeletal: Positive for joint swelling, arthralgias and gait problem. Negative for myalgias, back pain, neck pain and neck stiffness.  Neurological: Negative.   Psychiatric/Behavioral: Positive for agitation. Negative for suicidal  ideas, hallucinations, behavioral problems, confusion, sleep disturbance, self-injury, dysphoric mood and decreased concentration. The patient is nervous/anxious. The patient is not hyperactive.     Per HPI unless specifically indicated above     Objective:    BP 138/68 mmHg  Pulse 71  Temp(Src) 98.8 F (37.1 C)  Ht 5' 1.7" (1.567 m)  Wt 150 lb (68.04 kg)  BMI 27.71 kg/m2  SpO2 97%  Wt Readings from Last 3 Encounters:  09/16/15 150 lb (68.04 kg)  09/06/15 151 lb 6.4 oz (68.675 kg)  08/16/15 152 lb 9.6 oz (69.219 kg)    Physical Exam  Constitutional: She is oriented to person, place, and time. She appears well-developed and well-nourished. No distress.  HENT:  Head: Normocephalic and atraumatic.  Right Ear: Hearing, tympanic membrane, external ear and ear canal normal.  Left Ear: Hearing, tympanic membrane, external ear and ear canal normal.  Nose: Mucosal edema and septal deviation present. No rhinorrhea, nose lacerations, sinus tenderness, nasal deformity or nasal septal hematoma. No epistaxis.  No foreign bodies. Right sinus exhibits no maxillary sinus tenderness and no frontal sinus tenderness. Left sinus exhibits no maxillary sinus tenderness and no frontal sinus tenderness.  Mouth/Throat: Oropharynx is clear and moist. No oropharyngeal exudate.  Eyes: Conjunctivae and lids are normal. Right eye exhibits no discharge. Left eye exhibits no discharge. No scleral icterus.  Neck: Normal range of motion. Neck supple. No JVD present. No tracheal deviation present. No thyromegaly present.  Cardiovascular: Normal rate, regular rhythm, normal heart sounds and intact distal pulses.  Exam reveals no gallop  and no friction rub.   No murmur heard. Pulmonary/Chest: Effort normal and breath sounds normal. No stridor. No respiratory distress. She has no wheezes. She has no rales. She exhibits no tenderness.  Musculoskeletal: Normal range of motion. She exhibits tenderness. She exhibits no  edema.  Lymphadenopathy:    She has no cervical adenopathy.  Neurological: She is alert and oriented to person, place, and time. She has normal reflexes.  Skin: Skin is warm, dry and intact. No rash noted. No erythema. No pallor.  Psychiatric: She has a normal mood and affect. Her speech is normal and behavior is normal. Judgment and thought content normal. Cognition and memory are normal.  Nursing note and vitals reviewed.   Results for orders placed or performed in visit on 07/08/15  Lipid panel  Result Value Ref Range   Cholesterol 258 (H) 0 - 200 mg/dL   Triglycerides 157.0 (H) 0.0 - 149.0 mg/dL   HDL 80.50 >39.00 mg/dL   VLDL 31.4 0.0 - 40.0 mg/dL   LDL Cholesterol 146 (H) 0 - 99 mg/dL   Total CHOL/HDL Ratio 3    NonHDL 177.50   Comprehensive metabolic panel  Result Value Ref Range   Sodium 129 (L) 135 - 145 mEq/L   Potassium 4.3 3.5 - 5.1 mEq/L   Chloride 94 (L) 96 - 112 mEq/L   CO2 29 19 - 32 mEq/L   Glucose, Bld 112 (H) 70 - 99 mg/dL   BUN 20 6 - 23 mg/dL   Creatinine, Ser 1.00 0.40 - 1.20 mg/dL   Total Bilirubin 0.5 0.2 - 1.2 mg/dL   Alkaline Phosphatase 88 39 - 117 U/L   AST 15 0 - 37 U/L   ALT 13 0 - 35 U/L   Total Protein 6.5 6.0 - 8.3 g/dL   Albumin 3.9 3.5 - 5.2 g/dL   Calcium 9.3 8.4 - 10.5 mg/dL   GFR 58.15 (L) >60.00 mL/min  Hemoglobin A1c  Result Value Ref Range   Hgb A1c MFr Bld 5.4 4.6 - 6.5 %  CBC with Differential/Platelet  Result Value Ref Range   WBC 7.1 4.0 - 10.5 K/uL   RBC 4.04 3.87 - 5.11 Mil/uL   Hemoglobin 11.8 (L) 12.0 - 15.0 g/dL   HCT 34.8 (L) 36.0 - 46.0 %   MCV 85.9 78.0 - 100.0 fl   MCHC 34.1 30.0 - 36.0 g/dL   RDW 12.6 11.5 - 15.5 %   Platelets 226.0 150.0 - 400.0 K/uL   Neutrophils Relative % 62.6 43.0 - 77.0 %   Lymphocytes Relative 18.8 12.0 - 46.0 %   Monocytes Relative 7.8 3.0 - 12.0 %   Eosinophils Relative 10.3 (H) 0.0 - 5.0 %   Basophils Relative 0.5 0.0 - 3.0 %   Neutro Abs 4.4 1.4 - 7.7 K/uL   Lymphs Abs 1.3 0.7 -  4.0 K/uL   Monocytes Absolute 0.6 0.1 - 1.0 K/uL   Eosinophils Absolute 0.7 0.0 - 0.7 K/uL   Basophils Absolute 0.0 0.0 - 0.1 K/uL  TSH  Result Value Ref Range   TSH 1.10 0.35 - 4.50 uIU/mL      Assessment & Plan:   Problem List Items Addressed This Visit      Nervous and Auditory   ETD (eustachian tube dysfunction)    Will start flonase and see how she does.        Other Visit Diagnoses    Surgical counseling visit    -  Primary    Patient  has had surgery many times before without issue. Stratifies as lower risk. Cleared pending labs.     Relevant Orders    EKG 12-Lead (Completed)    Acute anxiety        Advised she talk to anesthesiologist about pre-procedure anxiety.        Follow up plan: Return As scheduled.

## 2015-09-16 NOTE — Assessment & Plan Note (Signed)
Will start flonase and see how she does.

## 2015-09-27 ENCOUNTER — Ambulatory Visit: Payer: Medicare Other | Admitting: Internal Medicine

## 2015-09-27 ENCOUNTER — Other Ambulatory Visit (HOSPITAL_COMMUNITY): Payer: Self-pay | Admitting: *Deleted

## 2015-09-27 NOTE — Progress Notes (Signed)
MEDICAL CLEARANCE NOTE MEGAN JOHNSON DO 09-16-15 EPIC EKG 09-16-15 EPIC

## 2015-09-27 NOTE — Patient Instructions (Addendum)
Michele Meyer  09/27/2015   Your procedure is scheduled on: 10-03-15  Report to [facility] Main  Entrance take [facility]  elevators to 3rd floor to  [place] at 1150 AM.  Call this number if you have problems the morning of surgery [phone]   Remember: ONLY 1 PERSON MAY GO WITH YOU TO SHORT STAY TO GET  READY MORNING OF [place].  Do not eat food After Midnight.clear liquids midnight until 850 am, nothing by mouth after 850 am.      Take these medicines the morning of surgery with A SIP OF WATER: FLONASE NASAL SPRAY, SERTRALINE (ZOLOFT), PANTAPRAZOLE (PROTONIX), METOPROLOL SUCCINATE              You may not have any metal on your body including hair pins and              piercings  Do not wear jewelry, make-up, lotions, powders or perfumes, deodorant             Do not wear nail polish.  Do not shave  48 hours prior to surgery.              Men may shave face and neck.   Do not bring valuables to the hospital. [place].  Contacts, dentures or bridgework may not be worn into surgery.  Leave suitcase in the car. After surgery it may be brought to your room.     Patients discharged the day of surgery will not be allowed to drive home.  Name and phone number of your driver:  Special Instructions: N/A              Please read over the following fact sheets you were given: _____________________________________________________________________                CLEAR LIQUID DIET   Foods Allowed                                                                     Foods Excluded  Coffee and tea, regular and decaf                             liquids that you cannot  Plain Jell-O in any flavor                                             see through such as: Fruit ices (not with fruit pulp)                                     milk, soups, orange juice  Iced Popsicles  All solid food Carbonated beverages, regular and diet                                    Cranberry, grape and apple juices Sports drinks like Gatorade Lightly seasoned clear broth or consume(fat free) Sugar, honey syrup  Sample Menu Breakfast                                Lunch                                     Supper Cranberry juice                    Beef broth                            Chicken broth Jell-O                                     Grape juice                           Apple juice Coffee or tea                        Jell-O                                      Popsicle                                                Coffee or tea                        Coffee or tea  _____________________________________________________________________  [facility] - Preparing for Surgery Before surgery, you can play an important role.  Because skin is not sterile, your skin needs to be as free of germs as possible.  You can reduce the number of germs on your skin by washing with CHG (chlorahexidine gluconate) soap before surgery.  CHG is an antiseptic cleaner which kills germs and bonds with the skin to continue killing germs even after washing. Please DO NOT use if you have an allergy to CHG or antibacterial soaps.  If your skin becomes reddened/irritated stop using the CHG and inform your nurse when you arrive at Short Stay. Do not shave (including legs and underarms) for at least 48 hours prior to the first CHG shower.  You may shave your face/neck. Please follow these instructions carefully:  1.  Shower with CHG Soap the night before surgery and the  morning of Surgery.  2.  If you choose to wash your hair, wash your hair first as usual with your  normal  shampoo.  3.  After you shampoo, rinse your hair and body thoroughly to remove the  shampoo.  4.  Use CHG as you would any other liquid soap.  You can apply chg directly  to the skin and wash                        Gently with a scrungie or clean washcloth.  5.  Apply the CHG Soap to your body ONLY FROM THE NECK DOWN.   Do not use on face/ open                           Wound or open sores. Avoid contact with eyes, ears mouth and genitals (private parts).                       Wash face,  Genitals (private parts) with your normal soap.             6.  Wash thoroughly, paying special attention to the area where your surgery  will be performed.  7.  Thoroughly rinse your body with warm water from the neck down.  8.  DO NOT shower/wash with your normal soap after using and rinsing off  the CHG Soap.                9.  Pat yourself dry with a clean towel.            10.  Wear clean pajamas.            11.  Place clean sheets on your bed the night of your first shower and do not  sleep with pets. Day of Surgery : Do not apply any lotions/deodorants the morning of surgery.  Please wear clean clothes to the hospital/surgery center.  FAILURE TO FOLLOW THESE INSTRUCTIONS MAY RESULT IN THE CANCELLATION OF YOUR SURGERY PATIENT SIGNATURE_________________________________  NURSE SIGNATURE__________________________________  ________________________________________________________________________   Adam Phenix  An incentive spirometer is a tool that can help keep your lungs clear and active. This tool measures how well you are filling your lungs with each breath. Taking long deep breaths may help reverse or decrease the chance of developing breathing (pulmonary) problems (especially infection) following:  A long period of time when you are unable to move or be active. BEFORE THE PROCEDURE   If the spirometer includes an indicator to show your best effort, your nurse or respiratory therapist will set it to a desired goal.  If possible, sit up straight or lean slightly forward. Try not to slouch.  Hold the incentive spirometer in an upright position. INSTRUCTIONS FOR USE   Sit on the edge of your bed  if possible, or sit up as far as you can in bed or on a chair.  Hold the incentive spirometer in an upright position.  Breathe out normally.  Place the mouthpiece in your mouth and seal your lips tightly around it.  Breathe in slowly and as deeply as possible, raising the piston or the ball toward the top of the column.  Hold your breath for 3-5 seconds or for as long as possible. Allow the piston or ball to fall to the bottom of the column.  Remove the mouthpiece from your mouth and breathe out normally.  Rest for a few seconds and repeat Steps 1 through 7 at least 10 times every 1-2 hours when you are awake. Take your time and take a few normal breaths between deep breaths.  The spirometer may include an indicator to  show your best effort. Use the indicator as a goal to work toward during each repetition.  After each set of 10 deep breaths, practice coughing to be sure your lungs are clear. If you have an incision (the cut made at the time of surgery), support your incision when coughing by placing a pillow or rolled up towels firmly against it. Once you are able to get out of bed, walk around indoors and cough well. You may stop using the incentive spirometer when instructed by your caregiver.  RISKS AND COMPLICATIONS  Take your time so you do not get dizzy or light-headed.  If you are in pain, you may need to take or ask for pain medication before doing incentive spirometry. It is harder to take a deep breath if you are having pain. AFTER USE  Rest and breathe slowly and easily.  It can be helpful to keep track of a log of your progress. Your caregiver can provide you with a simple table to help with this. If you are using the spirometer at home, follow these instructions: [place] IF:   You are having difficultly using the spirometer.  You have trouble using the spirometer as often as instructed.  Your pain medication is not giving enough relief while using the  spirometer.  You develop fever of 100.5 F (38.1 C) or higher. SEEK IMMEDIATE MEDICAL CARE IF:   You cough up bloody sputum that had not been present before.  You develop fever of 102 F (38.9 C) or greater.  You develop worsening pain at or near the incision site. MAKE SURE YOU:   Understand these instructions.  Will watch your condition.  Will get help right away if you are not doing well or get worse. Document Released: 04/22/2007 Document Revised: 03/03/2012 Document Reviewed: 06/23/2007 Exit[employer] Patient Information 2014 Exit[employer], [place].   ________________________________________________________________________  WHAT IS A BLOOD TRANSFUSION? Blood Transfusion Information  A transfusion is the replacement of blood or some of its parts. Blood is made up of multiple cells which provide different functions.  Red blood cells carry oxygen and are used for blood loss replacement.  White blood cells fight against infection.  Platelets control bleeding.  Plasma helps clot blood.  Other blood products are available for specialized needs, such as hemophilia or other clotting disorders. BEFORE THE TRANSFUSION  Who gives blood for transfusions?   Healthy volunteers who are fully evaluated to make sure their blood is safe. This is blood bank blood. Transfusion therapy is the safest it has ever been in the practice of medicine. Before blood is taken from a donor, a complete history is taken to make sure that person has no history of diseases nor engages in risky social behavior (examples are intravenous drug use or sexual activity with multiple partners). The donor's travel history is screened to minimize risk of transmitting infections, such as malaria. The donated blood is tested for signs of infectious diseases, such as HIV and hepatitis. The blood is then tested to be sure it is compatible with you in order to minimize the chance of a transfusion reaction. If you or a relative  donates blood, this is often done in anticipation of surgery and is not appropriate for emergency situations. It takes many days to process the donated blood. RISKS AND COMPLICATIONS Although transfusion therapy is very safe and saves many lives, the main dangers of transfusion include:   Getting an infectious disease.  Developing a transfusion reaction. This is an allergic reaction  to something in the blood you were given. Every precaution is taken to prevent this. The decision to have a blood transfusion has been considered carefully by your caregiver before blood is given. Blood is not given unless the benefits outweigh the risks. AFTER THE TRANSFUSION  Right after receiving a blood transfusion, you will usually feel much better and more energetic. This is especially true if your red blood cells have gotten low (anemic). The transfusion raises the level of the red blood cells which carry oxygen, and this usually causes an energy increase.  The nurse administering the transfusion will monitor you carefully for complications. HOME CARE INSTRUCTIONS  No special instructions are needed after a transfusion. You may find your energy is better. Speak with your caregiver about any limitations on activity for underlying diseases you may have. SEEK MEDICAL CARE IF:   Your condition is not improving after your transfusion.  You develop redness or irritation at the intravenous (IV) site. SEEK IMMEDIATE MEDICAL CARE IF:  Any of the following symptoms occur over the next 12 hours:  Shaking chills.  You have a temperature by mouth above 102 F (38.9 C), not controlled by medicine.  Chest, back, or muscle pain.  People around you feel you are not acting correctly or are confused.  Shortness of breath or difficulty breathing.  Dizziness and fainting.  You get a rash or develop hives.  You have a decrease in urine output.  Your urine turns a dark color or changes to pink, red, or brown. Any  of the following symptoms occur over the next 10 days:  You have a temperature by mouth above 102 F (38.9 C), not controlled by medicine.  Shortness of breath.  Weakness after normal activity.  The white part of the eye turns yellow (jaundice).  You have a decrease in the amount of urine or are urinating less often.  Your urine turns a dark color or changes to pink, red, or brown. Document Released: 12/07/2000 Document Revised: 03/03/2012 Document Reviewed: 07/26/2008 [facility] Patient Information 2014 [place], [place].  _______________________________________________________________________

## 2015-09-28 ENCOUNTER — Encounter (HOSPITAL_COMMUNITY): Payer: Self-pay

## 2015-09-28 ENCOUNTER — Encounter (HOSPITAL_COMMUNITY)
Admission: RE | Admit: 2015-09-28 | Discharge: 2015-09-28 | Disposition: A | Discharge status: Home / Self Care | Payer: Medicare Other | Source: Ambulatory Visit | Attending: Orthopedic Surgery | Admitting: Orthopedic Surgery

## 2015-09-28 DIAGNOSIS — M179 Osteoarthritis of knee, unspecified: Secondary | ICD-10-CM | POA: Insufficient documentation

## 2015-09-28 DIAGNOSIS — Z01818 Encounter for other preprocedural examination: Secondary | ICD-10-CM | POA: Insufficient documentation

## 2015-09-28 HISTORY — DX: Other specified postprocedural states: R11.2

## 2015-09-28 HISTORY — DX: Other specified postprocedural states: Z98.890

## 2015-09-28 HISTORY — DX: Abnormal radiologic findings on diagnostic imaging of renal pelvis, ureter, or bladder: R93.41

## 2015-09-28 LAB — COMPREHENSIVE METABOLIC PANEL
ALBUMIN: 4.5 g/dL (ref 3.5–5.0)
ALK PHOS: 85 U/L (ref 38–126)
ALT: 17 U/L (ref 14–54)
AST: 21 U/L (ref 15–41)
Anion gap: 7 (ref 5–15)
BILIRUBIN TOTAL: 0.3 mg/dL (ref 0.3–1.2)
BUN: 16 mg/dL (ref 6–20)
CALCIUM: 9.4 mg/dL (ref 8.9–10.3)
CO2: 29 mmol/L (ref 22–32)
CREATININE: 0.95 mg/dL (ref 0.44–1.00)
Chloride: 92 mmol/L — ABNORMAL LOW (ref 101–111)
GFR calc Af Amer: >60 mL/min (ref >60)
GFR calc non Af Amer: 59 mL/min — ABNORMAL LOW (ref >60)
GLUCOSE: 95 mg/dL (ref 65–99)
Potassium: 4 mmol/L (ref 3.5–5.1)
SODIUM: 128 mmol/L — AB (ref 135–145)
TOTAL PROTEIN: 7.6 g/dL (ref 6.5–8.1)

## 2015-09-28 LAB — URINE MICROSCOPIC-ADD ON

## 2015-09-28 LAB — APTT: aPTT: 31 seconds (ref 24–37)

## 2015-09-28 LAB — PROTIME-INR
INR: 1.02 (ref 0.00–1.49)
Prothrombin Time: 13.6 seconds (ref 11.6–15.2)

## 2015-09-28 LAB — CBC
HEMATOCRIT: 35 % — AB (ref 36.0–46.0)
HEMOGLOBIN: 12.3 g/dL (ref 12.0–15.0)
MCH: 29 pg (ref 26.0–34.0)
MCHC: 35.1 g/dL (ref 30.0–36.0)
MCV: 82.5 fL (ref 78.0–100.0)
Platelets: 260 10*3/uL (ref 150–400)
RBC: 4.24 MIL/uL (ref 3.87–5.11)
RDW: 12.4 % (ref 11.5–15.5)
WBC: 7.3 10*3/uL (ref 4.0–10.5)

## 2015-09-28 LAB — URINALYSIS, ROUTINE W REFLEX MICROSCOPIC
Bilirubin Urine: NEGATIVE
Glucose, UA: NEGATIVE mg/dL
Hgb urine dipstick: NEGATIVE
Ketones, ur: NEGATIVE mg/dL
NITRITE: NEGATIVE
PROTEIN: NEGATIVE mg/dL
SPECIFIC GRAVITY, URINE: 1.014 (ref 1.005–1.030)
UROBILINOGEN UA: 1 mg/dL (ref 0.0–1.0)
pH: 7 (ref 5.0–8.0)

## 2015-09-28 LAB — SURGICAL PCR SCREEN
MRSA, PCR: NEGATIVE
STAPHYLOCOCCUS AUREUS: NEGATIVE

## 2015-09-28 LAB — ABO/RH: ABO/RH(D): A NEG

## 2015-09-28 NOTE — Progress Notes (Signed)
Cmet, micro, ua results faxed to dr Wynelle Link by epic

## 2015-09-29 ENCOUNTER — Ambulatory Visit: Payer: Self-pay | Admitting: Orthopedic Surgery

## 2015-09-29 NOTE — Progress Notes (Signed)
NEW UPDATED  surgical consent order have been place into the Epic hospital system for Michele Meyer on 09/29/2015, 2:43 PM  by Mickel Crow for surgery on 10/03/2015.  Please have her sign this new updated consent form which includes an injection into the opposite knee at time of surgery. Arlee Muslim, PA-C

## 2015-10-02 ENCOUNTER — Ambulatory Visit: Payer: Self-pay | Admitting: Orthopedic Surgery

## 2015-10-02 NOTE — H&P (Signed)
Michele Meyer DOB: Nov 28, 1946 Married / Language: English / Race: White Female Date of Admission:  10/03/2015 CC:  Bilateral Knee Pain History of Present Illness The patient is a 69 year old female who comes in for a preoperative History and Physical. The patient is scheduled for a right total knee arthroplasty to be performed by Dr. Dione Plover. Aluisio, MD at [facility] on 10-03-2015. The patient is a 69 year old female who presents for follow up of her knees. The patient is being followed for their bilateral knee pain and osteoarthritis. They are now several months out from from cortisone injection in the right knee. Symptoms reported include: pain (shots started to wear off). The patient feels that they are doing poorly and report their pain level to be moderate. The following medication has been used for pain control: antiinflammatory medication (Meloxicam). [name]' right knee has gotten progressively worse. It is now interfering with what she can and cannot do. She is with her husband and he said she has significant functional limitations due to the knee. she has reached a point where she would like to get the knee fixed. The right knee is the worst of the two knees and she wants to get it replaced first. She would also like to get a cortisone injection into the left knee at the same time.  They have been treated conservatively in the past for the above stated problem and despite conservative measures, they continue to have progressive pain and severe functional limitations and dysfunction. They have failed non-operative management including home exercise, medications, and injections. It is felt that they would benefit from undergoing total joint replacement. Risks and benefits of the procedure have been discussed with the patient and they elect to proceed with surgery. There are no active contraindications to surgery such as ongoing infection or rapidly progressive neurological  disease.  Problem List/Past Medical Primary osteoarthritis of left knee (M17.12) Anxiety Disorder Depression High blood pressure Diverticulosis Skin Cancer Blood Clot Superficial (NOT DVT) Endometriosis Gestational Diabetes Mellitus  Allergies Penicillins Rash. Childhood PredniSONE *CORTICOSTEROIDS* GI upset and disrupts sleep Erythromycin *MACROLIDES* Vomiting. Codeine Derivatives GI upset  Family History Heart Disease grandmother mothers side Congestive Heart Failure mother Depression mother and father Diabetes Mellitus mother Drug / Alcohol Addiction father Cerebrovascular Accident First Degree Relatives. mother Kidney disease mother Hypertension mother Rheumatoid Arthritis child Osteoarthritis mother  Social History Illicit drug use no Living situation live with spouse Tobacco use Never smoker. never smoker Marital status married Children 5 or more Pain Contract no Current work status retired Alcohol use never consumed alcohol Drug/Alcohol Rehab (Currently) no Drug/Alcohol Rehab (Previously) no  Medication History Meloxicam (7.5MG  Tablet, Oral two times daily) Active. Valsartan-Hydrochlorothiazide (160-25MG  Tablet, Oral) Active. Zoloft (Oral) Specific dose unknown - Active. (1/2 dose now) Latanoprost (0.005% Solution, Ophthalmic) Active. Pantoprazole Sodium (40MG  Tablet DR, Oral) Active. ALPRAZolam (0.5MG  Tablet, 1 1/2 Oral) Active. (as needed)  Past Surgical History Leg Circulation Surgery bilateral - Vein Stripping Appendectomy Gallbladder Surgery open Bladder Surgery Hysterectomy (not due to cancer) - Complete 1st Rib Resection Bilateral   Review of Systems General Not Present- Chills, Fatigue, Fever, Memory Loss, Night Sweats, Weight Gain and Weight Loss. Skin Not Present- Eczema, Hives, Itching, Lesions and Rash. HEENT Not Present- Dentures, Double Vision, Headache, Hearing Loss, Tinnitus and Visual  Loss. Respiratory Not Present- Allergies, Chronic Cough, Coughing up blood, Shortness of breath at rest and Shortness of breath with exertion. Cardiovascular Not Present- Chest Pain, Difficulty Breathing Lying  Down, Murmur, Palpitations, Racing/skipping heartbeats and Swelling. Gastrointestinal Not Present- Abdominal Pain, Bloody Stool, Constipation, Diarrhea, Difficulty Swallowing, Heartburn, Jaundice, Loss of appetitie, Nausea and Vomiting. Female Genitourinary Not Present- Blood in Urine, Discharge, Flank Pain, Incontinence, Painful Urination, Urgency, Urinary frequency, Urinary Retention, Urinating at Night and Weak urinary stream. Musculoskeletal Present- Joint Pain. Not Present- Back Pain, Joint Swelling, Morning Stiffness, Muscle Pain, Muscle Weakness and Spasms. Neurological Not Present- Blackout spells, Difficulty with balance, Dizziness, Paralysis, Tremor and Weakness. Psychiatric Not Present- Insomnia.  Vitals Weight: 149 lb Height: 62in Body Surface Area: 1.69 m Body Mass Index: 27.25 kg/m  BP: 140/68 (Sitting, Left Arm, Standard)  Physical Exam  General Mental Status -Alert, cooperative and good historian. General Appearance-pleasant, Not in acute distress. Orientation-Oriented X3. Build & Nutrition-Well nourished and Well developed.  Head and Neck Head-normocephalic, atraumatic . Neck Global Assessment - supple, no bruit auscultated on the right, no bruit auscultated on the left.  Eye Pupil - Bilateral-Regular and Round. Motion - Bilateral-EOMI.  ENMT Note: partial upper and lower denture plates   Chest and Lung Exam Auscultation Breath sounds - clear at anterior chest wall and clear at posterior chest wall. Adventitious sounds - No Adventitious sounds.  Cardiovascular Auscultation Rhythm - Regular rate and rhythm. Heart Sounds - S1 WNL and S2 WNL. Murmurs & Other Heart Sounds - Auscultation of the heart reveals - No  Murmurs.  Abdomen Palpation/Percussion Tenderness - Abdomen is non-tender to palpation. Rigidity (guarding) - Abdomen is soft. Auscultation Auscultation of the abdomen reveals - Bowel sounds normal.  Female Genitourinary Note: Not done, not pertinent to present illness   Musculoskeletal Note: On exam, she is alert and oriented, in no apparent distress. Evaluation of her right knee shows no effusion. She has got varus deformity. Her range of motion is about 10 to 120, but marked crepitus on range of motion, tenderness, medial greater than lateral. No instability noted.  RADIOGRAPHS We reviewed her radiographs and she has bone on bone arthritis in the medial and patellofemoral compartments of that right knee. There is also arthritic change in the left knee, but not quite as bad.  Assessment & Plan Primary osteoarthritis of right knee (M17.11) Primary osteoarthritis of left knee (M17.12) Note:Surgical Plans: Right Total Knee Replacement and a Left Knee Cortisone Injection  Disposition: Home  PCP: Dr. Ellyn Hack - Patient has been seen preoperatively and felt to be stable for surgery.  IV TXA  Anesthesia Issues: Postop nausea and vomiting  Signed electronically by Joelene Millin, III PA-C

## 2015-10-03 ENCOUNTER — Inpatient Hospital Stay (HOSPITAL_COMMUNITY): Payer: Medicare Other | Admitting: Anesthesiology

## 2015-10-03 ENCOUNTER — Inpatient Hospital Stay (HOSPITAL_COMMUNITY)
Admission: RE | Admit: 2015-10-03 | Discharge: 2015-10-05 | DRG: 470 | Disposition: A | Discharge status: Home Health | Payer: Medicare Other | Source: Ambulatory Visit | Attending: Orthopedic Surgery | Admitting: Orthopedic Surgery

## 2015-10-03 ENCOUNTER — Encounter (HOSPITAL_COMMUNITY): Payer: Self-pay

## 2015-10-03 ENCOUNTER — Encounter (HOSPITAL_COMMUNITY)
Admission: RE | Disposition: A | Discharge status: Home Health | Payer: Self-pay | Source: Ambulatory Visit | Attending: Orthopedic Surgery

## 2015-10-03 DIAGNOSIS — N3020 Other chronic cystitis without hematuria: Secondary | ICD-10-CM | POA: Diagnosis present

## 2015-10-03 DIAGNOSIS — H409 Unspecified glaucoma: Secondary | ICD-10-CM | POA: Diagnosis present

## 2015-10-03 DIAGNOSIS — K5790 Diverticulosis of intestine, part unspecified, without perforation or abscess without bleeding: Secondary | ICD-10-CM | POA: Diagnosis present

## 2015-10-03 DIAGNOSIS — Z79899 Other long term (current) drug therapy: Secondary | ICD-10-CM | POA: Diagnosis not present

## 2015-10-03 DIAGNOSIS — Z85828 Personal history of other malignant neoplasm of skin: Secondary | ICD-10-CM

## 2015-10-03 DIAGNOSIS — Z880 Allergy status to penicillin: Secondary | ICD-10-CM

## 2015-10-03 DIAGNOSIS — M25761 Osteophyte, right knee: Secondary | ICD-10-CM | POA: Diagnosis present

## 2015-10-03 DIAGNOSIS — Z888 Allergy status to other drugs, medicaments and biological substances status: Secondary | ICD-10-CM | POA: Diagnosis not present

## 2015-10-03 DIAGNOSIS — Z823 Family history of stroke: Secondary | ICD-10-CM | POA: Diagnosis not present

## 2015-10-03 DIAGNOSIS — Z881 Allergy status to other antibiotic agents status: Secondary | ICD-10-CM | POA: Diagnosis not present

## 2015-10-03 DIAGNOSIS — R110 Nausea: Secondary | ICD-10-CM | POA: Diagnosis not present

## 2015-10-03 DIAGNOSIS — F419 Anxiety disorder, unspecified: Secondary | ICD-10-CM | POA: Diagnosis present

## 2015-10-03 DIAGNOSIS — M170 Bilateral primary osteoarthritis of knee: Principal | ICD-10-CM | POA: Diagnosis present

## 2015-10-03 DIAGNOSIS — Z01812 Encounter for preprocedural laboratory examination: Secondary | ICD-10-CM | POA: Diagnosis not present

## 2015-10-03 DIAGNOSIS — M1710 Unilateral primary osteoarthritis, unspecified knee: Secondary | ICD-10-CM | POA: Diagnosis present

## 2015-10-03 DIAGNOSIS — Z8249 Family history of ischemic heart disease and other diseases of the circulatory system: Secondary | ICD-10-CM

## 2015-10-03 DIAGNOSIS — M25561 Pain in right knee: Secondary | ICD-10-CM | POA: Diagnosis present

## 2015-10-03 DIAGNOSIS — Z833 Family history of diabetes mellitus: Secondary | ICD-10-CM

## 2015-10-03 DIAGNOSIS — F329 Major depressive disorder, single episode, unspecified: Secondary | ICD-10-CM | POA: Diagnosis present

## 2015-10-03 DIAGNOSIS — I10 Essential (primary) hypertension: Secondary | ICD-10-CM | POA: Diagnosis present

## 2015-10-03 DIAGNOSIS — Z885 Allergy status to narcotic agent status: Secondary | ICD-10-CM

## 2015-10-03 DIAGNOSIS — M1711 Unilateral primary osteoarthritis, right knee: Secondary | ICD-10-CM

## 2015-10-03 DIAGNOSIS — E785 Hyperlipidemia, unspecified: Secondary | ICD-10-CM | POA: Diagnosis present

## 2015-10-03 DIAGNOSIS — M179 Osteoarthritis of knee, unspecified: Secondary | ICD-10-CM | POA: Diagnosis present

## 2015-10-03 HISTORY — PX: TOTAL KNEE ARTHROPLASTY: SHX125

## 2015-10-03 LAB — TYPE AND SCREEN
ABO/RH(D): A NEG
Antibody Screen: NEGATIVE

## 2015-10-03 SURGERY — ARTHROPLASTY, KNEE, TOTAL
Anesthesia: Spinal | Site: Knee | Laterality: Right

## 2015-10-03 MED ORDER — SODIUM CHLORIDE 0.9 % IV SOLN
INTRAVENOUS | Status: DC
  Administered 2015-10-03 (×2): via INTRAVENOUS

## 2015-10-03 MED ORDER — BUPIVACAINE HCL 0.25 % IJ SOLN
INTRAMUSCULAR | Status: DC | PRN
  Administered 2015-10-03: 20 mL

## 2015-10-03 MED ORDER — PROMETHAZINE HCL 25 MG/ML IJ SOLN: 6.2500 mg | INTRAMUSCULAR | Status: DC | PRN

## 2015-10-03 MED ORDER — BUPIVACAINE HCL (PF) 0.25 % IJ SOLN
INTRAMUSCULAR | Status: AC
  Filled 2015-10-03: qty 30

## 2015-10-03 MED ORDER — HYDROMORPHONE HCL 2 MG PO TABS
2.0000 mg | ORAL_TABLET | ORAL | Status: DC | PRN
Start: 2015-10-03 — End: 2015-10-05
  Administered 2015-10-03 – 2015-10-04 (×2): 2 mg via ORAL
  Administered 2015-10-04 (×2): 4 mg via ORAL
  Administered 2015-10-05 (×4): 2 mg via ORAL
  Filled 2015-10-03: qty 1
  Filled 2015-10-03: qty 2
  Filled 2015-10-03: qty 1
  Filled 2015-10-03: qty 2
  Filled 2015-10-03 (×4): qty 1

## 2015-10-03 MED ORDER — LATANOPROST 0.005 % OP SOLN
1.0000 [drp] | Freq: Every day | OPHTHALMIC | Status: DC
  Administered 2015-10-03 – 2015-10-04 (×2): 1 [drp] via OPHTHALMIC
  Filled 2015-10-03: qty 2.5

## 2015-10-03 MED ORDER — CEFAZOLIN SODIUM-DEXTROSE 2-3 GM-% IV SOLR
INTRAVENOUS | Status: AC
  Filled 2015-10-03: qty 50

## 2015-10-03 MED ORDER — BUPIVACAINE LIPOSOME 1.3 % IJ SUSP
20.0000 mL | Freq: Once | INTRAMUSCULAR | Status: DC
  Filled 2015-10-03: qty 20

## 2015-10-03 MED ORDER — MIDAZOLAM HCL 2 MG/2ML IJ SOLN
INTRAMUSCULAR | Status: AC
  Filled 2015-10-03: qty 4

## 2015-10-03 MED ORDER — KETOROLAC TROMETHAMINE 15 MG/ML IJ SOLN
7.5000 mg | Freq: Four times a day (QID) | INTRAMUSCULAR | Status: AC | PRN
  Filled 2015-10-03: qty 1

## 2015-10-03 MED ORDER — RIVAROXABAN 10 MG PO TABS
10.0000 mg | ORAL_TABLET | Freq: Every day | ORAL | Status: DC
  Administered 2015-10-04 – 2015-10-05 (×2): 10 mg via ORAL
  Filled 2015-10-03 (×3): qty 1

## 2015-10-03 MED ORDER — FENTANYL CITRATE (PF) 100 MCG/2ML IJ SOLN
INTRAMUSCULAR | Status: DC | PRN
  Administered 2015-10-03: 100 ug via INTRAVENOUS

## 2015-10-03 MED ORDER — FENTANYL CITRATE (PF) 100 MCG/2ML IJ SOLN
INTRAMUSCULAR | Status: AC
  Filled 2015-10-03: qty 4

## 2015-10-03 MED ORDER — SODIUM CHLORIDE 0.9 % IJ SOLN
INTRAMUSCULAR | Status: AC
  Filled 2015-10-03: qty 50

## 2015-10-03 MED ORDER — ACETAMINOPHEN 10 MG/ML IV SOLN
INTRAVENOUS | Status: AC
  Filled 2015-10-03: qty 100

## 2015-10-03 MED ORDER — PROPOFOL 10 MG/ML IV BOLUS
INTRAVENOUS | Status: DC | PRN
  Administered 2015-10-03 (×2): 20 mg via INTRAVENOUS
  Administered 2015-10-03: 10 mg via INTRAVENOUS

## 2015-10-03 MED ORDER — METOPROLOL SUCCINATE 12.5 MG HALF TABLET
12.5000 mg | ORAL_TABLET | Freq: Two times a day (BID) | ORAL | Status: DC
  Administered 2015-10-03 – 2015-10-05 (×4): 12.5 mg via ORAL
  Filled 2015-10-03 (×5): qty 1

## 2015-10-03 MED ORDER — MENTHOL 3 MG MT LOZG: 1.0000 | LOZENGE | OROMUCOSAL | Status: DC | PRN

## 2015-10-03 MED ORDER — BUPIVACAINE IN DEXTROSE 0.75-8.25 % IT SOLN
INTRATHECAL | Status: DC | PRN
  Administered 2015-10-03: 1.6 mL via INTRATHECAL

## 2015-10-03 MED ORDER — CEFAZOLIN SODIUM-DEXTROSE 2-3 GM-% IV SOLR
2.0000 g | INTRAVENOUS | Status: AC
  Administered 2015-10-03: 2 g via INTRAVENOUS

## 2015-10-03 MED ORDER — IRBESARTAN 150 MG PO TABS
150.0000 mg | ORAL_TABLET | Freq: Every day | ORAL | Status: DC
  Filled 2015-10-03 (×2): qty 1

## 2015-10-03 MED ORDER — PROPOFOL 500 MG/50ML IV EMUL
INTRAVENOUS | Status: DC | PRN
  Administered 2015-10-03: 140 ug/kg/min via INTRAVENOUS

## 2015-10-03 MED ORDER — ACETAMINOPHEN 500 MG PO TABS
1000.0000 mg | ORAL_TABLET | Freq: Four times a day (QID) | ORAL | Status: AC
  Administered 2015-10-03 – 2015-10-04 (×4): 1000 mg via ORAL
  Filled 2015-10-03 (×5): qty 2

## 2015-10-03 MED ORDER — TRAMADOL HCL 50 MG PO TABS
50.0000 mg | ORAL_TABLET | Freq: Four times a day (QID) | ORAL | Status: DC | PRN
Start: 2015-10-03 — End: 2015-10-05
  Filled 2015-10-03: qty 1

## 2015-10-03 MED ORDER — METOCLOPRAMIDE HCL 5 MG/ML IJ SOLN: 5.0000 mg | Freq: Three times a day (TID) | INTRAMUSCULAR | Status: DC | PRN

## 2015-10-03 MED ORDER — ONDANSETRON HCL 4 MG PO TABS
4.0000 mg | ORAL_TABLET | Freq: Four times a day (QID) | ORAL | Status: DC | PRN
  Administered 2015-10-04 – 2015-10-05 (×2): 4 mg via ORAL
  Filled 2015-10-03 (×2): qty 1

## 2015-10-03 MED ORDER — MIDAZOLAM HCL 5 MG/5ML IJ SOLN
INTRAMUSCULAR | Status: DC | PRN
  Administered 2015-10-03 (×2): 1 mg via INTRAVENOUS

## 2015-10-03 MED ORDER — ACETAMINOPHEN 650 MG RE SUPP: 650.0000 mg | Freq: Four times a day (QID) | RECTAL | Status: DC | PRN

## 2015-10-03 MED ORDER — FLEET ENEMA 7-19 GM/118ML RE ENEM: 1.0000 | ENEMA | Freq: Once | RECTAL | Status: DC | PRN

## 2015-10-03 MED ORDER — BUPIVACAINE LIPOSOME 1.3 % IJ SUSP
INTRAMUSCULAR | Status: DC | PRN
  Administered 2015-10-03: 20 mL

## 2015-10-03 MED ORDER — KCL IN DEXTROSE-NACL 20-5-0.9 MEQ/L-%-% IV SOLN
INTRAVENOUS | Status: DC
  Administered 2015-10-03: 19:00:00 via INTRAVENOUS
  Filled 2015-10-03 (×2): qty 1000

## 2015-10-03 MED ORDER — PHENOL 1.4 % MT LIQD: 1.0000 | OROMUCOSAL | Status: DC | PRN

## 2015-10-03 MED ORDER — PROPOFOL 10 MG/ML IV BOLUS
INTRAVENOUS | Status: AC
  Filled 2015-10-03: qty 20

## 2015-10-03 MED ORDER — METOCLOPRAMIDE HCL 10 MG PO TABS
5.0000 mg | ORAL_TABLET | Freq: Three times a day (TID) | ORAL | Status: DC | PRN
  Administered 2015-10-04: 10 mg via ORAL
  Filled 2015-10-03: qty 1

## 2015-10-03 MED ORDER — LIDOCAINE HCL (CARDIAC) 20 MG/ML IV SOLN
INTRAVENOUS | Status: AC
  Filled 2015-10-03: qty 5

## 2015-10-03 MED ORDER — HYDROMORPHONE HCL 1 MG/ML IJ SOLN
0.5000 mg | INTRAMUSCULAR | Status: DC | PRN
  Administered 2015-10-03: 0.5 mg via INTRAVENOUS
  Filled 2015-10-03: qty 1

## 2015-10-03 MED ORDER — ACETAMINOPHEN 325 MG PO TABS: 650.0000 mg | ORAL_TABLET | Freq: Four times a day (QID) | ORAL | Status: DC | PRN

## 2015-10-03 MED ORDER — HYDROMORPHONE HCL 1 MG/ML IJ SOLN: 0.2500 mg | INTRAMUSCULAR | Status: DC | PRN

## 2015-10-03 MED ORDER — DEXAMETHASONE SODIUM PHOSPHATE 10 MG/ML IJ SOLN
INTRAMUSCULAR | Status: AC
  Filled 2015-10-03: qty 1

## 2015-10-03 MED ORDER — FLUTICASONE PROPIONATE 50 MCG/ACT NA SUSP
2.0000 | Freq: Every day | NASAL | Status: DC
Start: 2015-10-04 — End: 2015-10-05
  Administered 2015-10-04 – 2015-10-05 (×2): 2 via NASAL
  Filled 2015-10-03: qty 16

## 2015-10-03 MED ORDER — CEFAZOLIN SODIUM-DEXTROSE 2-3 GM-% IV SOLR
2.0000 g | Freq: Four times a day (QID) | INTRAVENOUS | Status: AC
  Administered 2015-10-03 – 2015-10-04 (×2): 2 g via INTRAVENOUS
  Filled 2015-10-03 (×2): qty 50

## 2015-10-03 MED ORDER — EPHEDRINE SULFATE 50 MG/ML IJ SOLN
INTRAMUSCULAR | Status: DC | PRN
  Administered 2015-10-03 (×3): 10 mg via INTRAVENOUS

## 2015-10-03 MED ORDER — DEXAMETHASONE SODIUM PHOSPHATE 10 MG/ML IJ SOLN
10.0000 mg | Freq: Once | INTRAMUSCULAR | Status: AC
  Administered 2015-10-03: 10 mg via INTRAVENOUS

## 2015-10-03 MED ORDER — ACETAMINOPHEN 10 MG/ML IV SOLN
1000.0000 mg | Freq: Once | INTRAVENOUS | Status: AC
  Administered 2015-10-03: 1000 mg via INTRAVENOUS

## 2015-10-03 MED ORDER — VALSARTAN-HYDROCHLOROTHIAZIDE 160-25 MG PO TABS: 1.0000 | ORAL_TABLET | Freq: Every day | ORAL | Status: DC

## 2015-10-03 MED ORDER — POLYETHYLENE GLYCOL 3350 17 G PO PACK: 17.0000 g | PACK | Freq: Every day | ORAL | Status: DC | PRN

## 2015-10-03 MED ORDER — HYDROCHLOROTHIAZIDE 25 MG PO TABS
25.0000 mg | ORAL_TABLET | Freq: Every day | ORAL | Status: DC
  Filled 2015-10-03: qty 1

## 2015-10-03 MED ORDER — DEXTROSE 5 % IV SOLN
500.0000 mg | Freq: Four times a day (QID) | INTRAVENOUS | Status: DC | PRN
  Administered 2015-10-03: 500 mg via INTRAVENOUS
  Filled 2015-10-03 (×2): qty 5

## 2015-10-03 MED ORDER — SODIUM CHLORIDE 0.9 % IJ SOLN
INTRAMUSCULAR | Status: DC | PRN
  Administered 2015-10-03: 30 mL

## 2015-10-03 MED ORDER — DIPHENHYDRAMINE HCL 12.5 MG/5ML PO ELIX: 12.5000 mg | ORAL_SOLUTION | ORAL | Status: DC | PRN

## 2015-10-03 MED ORDER — SERTRALINE HCL 50 MG PO TABS
50.0000 mg | ORAL_TABLET | Freq: Two times a day (BID) | ORAL | Status: DC
  Administered 2015-10-03 – 2015-10-05 (×4): 50 mg via ORAL
  Filled 2015-10-03 (×5): qty 1

## 2015-10-03 MED ORDER — BISACODYL 10 MG RE SUPP: 10.0000 mg | Freq: Every day | RECTAL | Status: DC | PRN

## 2015-10-03 MED ORDER — TRANEXAMIC ACID 1000 MG/10ML IV SOLN
1000.0000 mg | INTRAVENOUS | Status: AC
  Administered 2015-10-03: 1000 mg via INTRAVENOUS
  Filled 2015-10-03: qty 10

## 2015-10-03 MED ORDER — ONDANSETRON HCL 4 MG/2ML IJ SOLN
INTRAMUSCULAR | Status: AC
  Filled 2015-10-03: qty 2

## 2015-10-03 MED ORDER — PANTOPRAZOLE SODIUM 40 MG PO TBEC
40.0000 mg | DELAYED_RELEASE_TABLET | Freq: Every day | ORAL | Status: DC
  Administered 2015-10-04 – 2015-10-05 (×2): 40 mg via ORAL
  Filled 2015-10-03 (×3): qty 1

## 2015-10-03 MED ORDER — ALPRAZOLAM 0.25 MG PO TABS
0.3750 mg | ORAL_TABLET | Freq: Every evening | ORAL | Status: DC | PRN
  Filled 2015-10-03: qty 2

## 2015-10-03 MED ORDER — ONDANSETRON HCL 4 MG/2ML IJ SOLN
INTRAMUSCULAR | Status: DC | PRN
  Administered 2015-10-03: 4 mg via INTRAVENOUS

## 2015-10-03 MED ORDER — DOCUSATE SODIUM 100 MG PO CAPS
100.0000 mg | ORAL_CAPSULE | Freq: Two times a day (BID) | ORAL | Status: DC
  Administered 2015-10-04 – 2015-10-05 (×3): 100 mg via ORAL

## 2015-10-03 MED ORDER — METHOCARBAMOL 500 MG PO TABS
500.0000 mg | ORAL_TABLET | Freq: Four times a day (QID) | ORAL | Status: DC | PRN
  Administered 2015-10-04 (×2): 500 mg via ORAL
  Filled 2015-10-03 (×2): qty 1

## 2015-10-03 MED ORDER — ONDANSETRON HCL 4 MG/2ML IJ SOLN: 4.0000 mg | Freq: Four times a day (QID) | INTRAMUSCULAR | Status: DC | PRN

## 2015-10-03 SURGICAL SUPPLY — 62 items
BAG DECANTER FOR FLEXI CONT (MISCELLANEOUS) IMPLANT
BAG ZIPLOCK 12X15 (MISCELLANEOUS) ×3 IMPLANT
BANDAGE ELASTIC 6 VELCRO ST LF (GAUZE/BANDAGES/DRESSINGS) ×3 IMPLANT
BANDAGE ESMARK 6X9 LF (GAUZE/BANDAGES/DRESSINGS) ×1 IMPLANT
BLADE SAG 18X100X1.27 (BLADE) ×3 IMPLANT
BLADE SAW SGTL 11.0X1.19X90.0M (BLADE) ×3 IMPLANT
BNDG ESMARK 6X9 LF (GAUZE/BANDAGES/DRESSINGS) ×3
BOWL SMART MIX CTS (DISPOSABLE) ×3 IMPLANT
CAPT KNEE TOTAL 3 ATTUNE ×3 IMPLANT
CEMENT HV SMART SET (Cement) ×6 IMPLANT
CLOSURE WOUND 1/2 X4 (GAUZE/BANDAGES/DRESSINGS) ×1
CUFF TOURN SGL QUICK 34 (TOURNIQUET CUFF) ×2
CUFF TRNQT CYL 34X4X40X1 (TOURNIQUET CUFF) ×1 IMPLANT
DECANTER SPIKE VIAL GLASS SM (MISCELLANEOUS) ×3 IMPLANT
DRAPE EXTREMITY T 121X128X90 (DRAPE) ×3 IMPLANT
DRAPE POUCH INSTRU U-SHP 10X18 (DRAPES) ×3 IMPLANT
DRAPE U-SHAPE 47X51 STRL (DRAPES) ×3 IMPLANT
DRSG ADAPTIC 3X8 NADH LF (GAUZE/BANDAGES/DRESSINGS) ×3 IMPLANT
DRSG PAD ABDOMINAL 8X10 ST (GAUZE/BANDAGES/DRESSINGS) ×3 IMPLANT
DURAPREP 26ML APPLICATOR (WOUND CARE) ×3 IMPLANT
ELECT REM PT RETURN 9FT ADLT (ELECTROSURGICAL) ×3
ELECTRODE REM PT RTRN 9FT ADLT (ELECTROSURGICAL) ×1 IMPLANT
EVACUATOR 1/8 PVC DRAIN (DRAIN) ×3 IMPLANT
FACESHIELD WRAPAROUND (MASK) ×15 IMPLANT
GAUZE SPONGE 4X4 12PLY STRL (GAUZE/BANDAGES/DRESSINGS) ×3 IMPLANT
GLOVE BIO SURGEON STRL SZ7.5 (GLOVE) ×3 IMPLANT
GLOVE BIO SURGEON STRL SZ8 (GLOVE) ×3 IMPLANT
GLOVE BIOGEL PI IND STRL 6.5 (GLOVE) IMPLANT
GLOVE BIOGEL PI IND STRL 8 (GLOVE) ×1 IMPLANT
GLOVE BIOGEL PI INDICATOR 6.5 (GLOVE)
GLOVE BIOGEL PI INDICATOR 8 (GLOVE) ×2
GLOVE SURG SS PI 6.5 STRL IVOR (GLOVE) IMPLANT
GOWN STRL REUS W/TWL LRG LVL3 (GOWN DISPOSABLE) ×3 IMPLANT
GOWN STRL REUS W/TWL XL LVL3 (GOWN DISPOSABLE) ×3 IMPLANT
HANDPIECE INTERPULSE COAX TIP (DISPOSABLE) ×2
IMMOBILIZER KNEE 20 (SOFTGOODS) ×3
IMMOBILIZER KNEE 20 THIGH 36 (SOFTGOODS) ×1 IMPLANT
KIT BASIN OR (CUSTOM PROCEDURE TRAY) ×3 IMPLANT
MANIFOLD NEPTUNE II (INSTRUMENTS) ×3 IMPLANT
NDL SAFETY ECLIPSE 18X1.5 (NEEDLE) ×2 IMPLANT
NEEDLE HYPO 18GX1.5 SHARP (NEEDLE) ×4
NS IRRIG 1000ML POUR BTL (IV SOLUTION) ×3 IMPLANT
PACK TOTAL JOINT (CUSTOM PROCEDURE TRAY) ×3 IMPLANT
PADDING CAST COTTON 6X4 STRL (CAST SUPPLIES) ×3 IMPLANT
PEN SKIN MARKING BROAD (MISCELLANEOUS) ×3 IMPLANT
POSITIONER SURGICAL ARM (MISCELLANEOUS) ×3 IMPLANT
SET HNDPC FAN SPRY TIP SCT (DISPOSABLE) ×1 IMPLANT
STRIP CLOSURE SKIN 1/2X4 (GAUZE/BANDAGES/DRESSINGS) ×2 IMPLANT
SUCTION FRAZIER 12FR DISP (SUCTIONS) ×3 IMPLANT
SUT MNCRL AB 4-0 PS2 18 (SUTURE) ×3 IMPLANT
SUT VIC AB 2-0 CT1 27 (SUTURE) ×6
SUT VIC AB 2-0 CT1 TAPERPNT 27 (SUTURE) ×3 IMPLANT
SUT VLOC 180 0 24IN GS25 (SUTURE) ×3 IMPLANT
SYR 20CC LL (SYRINGE) ×3 IMPLANT
SYR 50ML LL SCALE MARK (SYRINGE) ×3 IMPLANT
TOWEL OR 17X26 10 PK STRL BLUE (TOWEL DISPOSABLE) ×3 IMPLANT
TOWEL OR NON WOVEN STRL DISP B (DISPOSABLE) IMPLANT
TRAY FOLEY W/METER SILVER 14FR (SET/KITS/TRAYS/PACK) IMPLANT
TRAY FOLEY W/METER SILVER 16FR (SET/KITS/TRAYS/PACK) ×3 IMPLANT
WATER STERILE IRR 1500ML POUR (IV SOLUTION) ×3 IMPLANT
WRAP KNEE MAXI GEL POST OP (GAUZE/BANDAGES/DRESSINGS) ×3 IMPLANT
YANKAUER SUCT BULB TIP 10FT TU (MISCELLANEOUS) ×3 IMPLANT

## 2015-10-03 NOTE — Anesthesia Preprocedure Evaluation (Addendum)
Anesthesia Evaluation  Patient identified by MRN, date of birth, ID band Patient awake    Reviewed: Allergy & Precautions, NPO status , Patient's Chart, lab work & pertinent test results  History of Anesthesia Complications (+) PONV and history of anesthetic complications  Airway Mallampati: II  TM Distance: >3 FB Neck ROM: Full    Dental no notable dental hx.    Pulmonary neg pulmonary ROS, former smoker,    Pulmonary exam normal breath sounds clear to auscultation       Cardiovascular Exercise Tolerance: Good hypertension, Pt. on medications and Pt. on home beta blockers + Peripheral Vascular Disease  Normal cardiovascular exam Rhythm:Regular Rate:Normal     Neuro/Psych PSYCHIATRIC DISORDERS Anxiety Depression H/O sciatic nerve neuralgia neuritis listed on chart. Patient states that she does have chronic back pain, but no sciatic symptoms.  Neuromuscular disease    GI/Hepatic Neg liver ROS, GERD  Medicated,  Endo/Other  diabetes, Gestational  Renal/GU Renal disease  negative genitourinary   Musculoskeletal  (+) Arthritis ,   Abdominal   Peds negative pediatric ROS (+)  Hematology negative hematology ROS (+)   Anesthesia Other Findings   Reproductive/Obstetrics negative OB ROS                            Anesthesia Physical Anesthesia Plan  ASA: II  Anesthesia Plan: Spinal   Post-op Pain Management:    Induction: Intravenous  Airway Management Planned: Natural Airway  Additional Equipment:   Intra-op Plan:   Post-operative Plan:   Informed Consent: I have reviewed the patients History and Physical, chart, labs and discussed the procedure including the risks, benefits and alternatives for the proposed anesthesia with the patient or authorized representative who has indicated his/her understanding and acceptance.   Dental advisory given  Plan Discussed with:  CRNA  Anesthesia Plan Comments: (Discussed risks and benefits of and differences between spinal and general. Discussed risks of spinal including headache, backache, failure, bleeding and hematoma, infection, and nerve damage. Patient consents to spinal. Questions answered. Coagulation studies and platelet count acceptable. )        Anesthesia Quick Evaluation

## 2015-10-03 NOTE — Interval H&P Note (Signed)
History and Physical Interval Note:  10/03/2015 2:33 PM  Michele Meyer  has presented today for surgery, with the diagnosis of Right Knee Osteoarthritis  The various methods of treatment have been discussed with the patient and family. After consideration of risks, benefits and other options for treatment, the patient has consented to  Procedure(s): RIGHT TOTAL KNEE ARTHROPLASTY (Right) as a surgical intervention .  The patient's history has been reviewed, patient examined, no change in status, stable for surgery.  I have reviewed the patient's chart and labs.  Questions were answered to the patient's satisfaction.     Gearlean Alf

## 2015-10-03 NOTE — Anesthesia Postprocedure Evaluation (Signed)
  Anesthesia Post-op Note  Patient: Michele Meyer  Procedure(s) Performed: Procedure(s) (LRB): RIGHT TOTAL KNEE ARTHROPLASTY (Right)  Patient Location: PACU  Anesthesia Type: Spinal  Level of Consciousness: awake and alert   Airway and Oxygen Therapy: Patient Spontanous Breathing  Post-op Pain: mild  Post-op Assessment: Post-op Vital signs reviewed, Patient's Cardiovascular Status Stable, Respiratory Function Stable, Patent Airway and No signs of Nausea or vomiting. Purposeful movement both LE in PACU.  Last Vitals:  Filed Vitals:   10/03/15 1740  BP: 148/63  Pulse: 76  Temp: 36.8 C  Resp: 14    Post-op Vital Signs: stable   Complications: No apparent anesthesia complications

## 2015-10-03 NOTE — H&P (View-Only) (Signed)
Michele Meyer DOB: 27-Dec-1945 Married / Language: English / Race: White Female Date of Admission:  10/03/2015 CC:  Bilateral Knee Pain History of Present Illness The patient is a 69 year old female who comes in for a preoperative History and Physical. The patient is scheduled for a right total knee arthroplasty to be performed by Dr. Dione Meyer. Aluisio, MD at [facility] on 10-03-2015. The patient is a 69 year old female who presents for follow up of her knees. The patient is being followed for their bilateral knee pain and osteoarthritis. They are now several months out from from cortisone injection in the right knee. Symptoms reported include: pain (shots started to wear off). The patient feels that they are doing poorly and report their pain level to be moderate. The following medication has been used for pain control: antiinflammatory medication (Meloxicam). Michele Meyer' right knee has gotten progressively worse. It is now interfering with what she can and cannot do. She is with her husband and he said she has significant functional limitations due to the knee. she has reached a point where she would like to get the knee fixed. The right knee is the worst of the two knees and she wants to get it replaced first. She would also like to get a cortisone injection into the left knee at the same time.  They have been treated conservatively in the past for the above stated problem and despite conservative measures, they continue to have progressive pain and severe functional limitations and dysfunction. They have failed non-operative management including home exercise, medications, and injections. It is felt that they would benefit from undergoing total joint replacement. Risks and benefits of the procedure have been discussed with the patient and they elect to proceed with surgery. There are no active contraindications to surgery such as ongoing infection or rapidly progressive neurological  disease.  Problem List/Past Medical Primary osteoarthritis of left knee (M17.12) Anxiety Disorder Depression High blood pressure Diverticulosis Skin Cancer Blood Clot Superficial (NOT DVT) Endometriosis Gestational Diabetes Mellitus  Allergies Penicillins Rash. Childhood PredniSONE *CORTICOSTEROIDS* GI upset and disrupts sleep Erythromycin *MACROLIDES* Vomiting. Codeine Derivatives GI upset  Family History Heart Disease grandmother mothers side Congestive Heart Failure mother Depression mother and father Diabetes Mellitus mother Drug / Alcohol Addiction father Cerebrovascular Accident First Degree Relatives. mother Kidney disease mother Hypertension mother Rheumatoid Arthritis child Osteoarthritis mother  Social History Illicit drug use no Living situation live with spouse Tobacco use Never smoker. never smoker Marital status married Children 5 or more Pain Contract no Current work status retired Alcohol use never consumed alcohol Drug/Alcohol Rehab (Currently) no Drug/Alcohol Rehab (Previously) no  Medication History Meloxicam (7.5MG  Tablet, Oral two times daily) Active. Valsartan-Hydrochlorothiazide (160-25MG  Tablet, Oral) Active. Zoloft (Oral) Specific dose unknown - Active. (1/2 dose now) Latanoprost (0.005% Solution, Ophthalmic) Active. Pantoprazole Sodium (40MG  Tablet DR, Oral) Active. ALPRAZolam (0.5MG  Tablet, 1 1/2 Oral) Active. (as needed)  Past Surgical History Leg Circulation Surgery bilateral - Vein Stripping Appendectomy Gallbladder Surgery open Bladder Surgery Hysterectomy (not due to cancer) - Complete 1st Rib Resection Bilateral   Review of Systems General Not Present- Chills, Fatigue, Fever, Memory Loss, Night Sweats, Weight Gain and Weight Loss. Skin Not Present- Eczema, Hives, Itching, Lesions and Rash. HEENT Not Present- Dentures, Double Vision, Headache, Hearing Loss, Tinnitus and Visual  Loss. Respiratory Not Present- Allergies, Chronic Cough, Coughing up blood, Shortness of breath at rest and Shortness of breath with exertion. Cardiovascular Not Present- Chest Pain, Difficulty Breathing Lying  Down, Murmur, Palpitations, Racing/skipping heartbeats and Swelling. Gastrointestinal Not Present- Abdominal Pain, Bloody Stool, Constipation, Diarrhea, Difficulty Swallowing, Heartburn, Jaundice, Loss of appetitie, Nausea and Vomiting. Female Genitourinary Not Present- Blood in Urine, Discharge, Flank Pain, Incontinence, Painful Urination, Urgency, Urinary frequency, Urinary Retention, Urinating at Night and Weak urinary stream. Musculoskeletal Present- Joint Pain. Not Present- Back Pain, Joint Swelling, Morning Stiffness, Muscle Pain, Muscle Weakness and Spasms. Neurological Not Present- Blackout spells, Difficulty with balance, Dizziness, Paralysis, Tremor and Weakness. Psychiatric Not Present- Insomnia.  Vitals Weight: 149 lb Height: 62in Body Surface Area: 1.69 m Body Mass Index: 27.25 kg/m  BP: 140/68 (Sitting, Left Arm, Standard)  Physical Exam  General Mental Status -Alert, cooperative and good historian. General Appearance-pleasant, Not in acute distress. Orientation-Oriented X3. Build & Nutrition-Well nourished and Well developed.  Head and Neck Head-normocephalic, atraumatic . Neck Global Assessment - supple, no bruit auscultated on the right, no bruit auscultated on the left.  Eye Pupil - Bilateral-Regular and Round. Motion - Bilateral-EOMI.  ENMT Note: partial upper and lower denture plates   Chest and Lung Exam Auscultation Breath sounds - clear at anterior chest wall and clear at posterior chest wall. Adventitious sounds - No Adventitious sounds.  Cardiovascular Auscultation Rhythm - Regular rate and rhythm. Heart Sounds - S1 WNL and S2 WNL. Murmurs & Other Heart Sounds - Auscultation of the heart reveals - No  Murmurs.  Abdomen Palpation/Percussion Tenderness - Abdomen is non-tender to palpation. Rigidity (guarding) - Abdomen is soft. Auscultation Auscultation of the abdomen reveals - Bowel sounds normal.  Female Genitourinary Note: Not done, not pertinent to present illness   Musculoskeletal Note: On exam, she is alert and oriented, in no apparent distress. Evaluation of her right knee shows no effusion. She has got varus deformity. Her range of motion is about 10 to 120, but marked crepitus on range of motion, tenderness, medial greater than lateral. No instability noted.  RADIOGRAPHS We reviewed her radiographs and she has bone on bone arthritis in the medial and patellofemoral compartments of that right knee. There is also arthritic change in the left knee, but not quite as bad.  Assessment & Plan Primary osteoarthritis of right knee (M17.11) Primary osteoarthritis of left knee (M17.12) Note:Surgical Plans: Right Total Knee Replacement and a Left Knee Cortisone Injection  Disposition: Home  PCP: Dr. Ellyn Hack - Patient has been seen preoperatively and felt to be stable for surgery.  IV TXA  Anesthesia Issues: Postop nausea and vomiting  Signed electronically by Joelene Millin, III PA-C

## 2015-10-03 NOTE — Anesthesia Procedure Notes (Signed)
Spinal Patient location during procedure: OR Start time: 10/03/2015 2:40 PM End time: 10/03/2015 2:50 PM Staffing Anesthesiologist: Franne Grip Resident/CRNA: Danley Danker L Performed by: anesthesiologist and resident/CRNA  Preanesthetic Checklist Completed: patient identified, site marked, surgical consent, pre-op evaluation, timeout performed, IV checked, risks and benefits discussed and monitors and equipment checked Spinal Block Patient position: sitting Prep: Betadine Patient monitoring: heart rate, continuous pulse ox and blood pressure Approach: midline Location: L3-4 Injection technique: single-shot Needle Needle type: Whitacre  Needle gauge: 22 G Needle length: 9 cm Assessment Sensory level: T6 Additional Notes Kit lot #[id number] and expiration date 11/2016 Free flow of CSF, negative heme, negative paresthesia Tolerated well and returned to supine position

## 2015-10-03 NOTE — Plan of Care (Signed)
Problem: Consults Goal: Diagnosis- Total Joint Replacement Right total knee     

## 2015-10-03 NOTE — Op Note (Signed)
Pre-operative diagnosis- Osteoarthritis  Right knee(s)  Post-operative diagnosis- Osteoarthritis Right knee(s)  Procedure-  Right  Total Knee Arthroplasty  Surgeon- Dione Plover. Gared Gillie, MD  Assistant- Arlee Muslim, Pa-C   Anesthesia-  Spinal  EBL-* No blood loss amount entered *   Drains Hemovac  Tourniquet time-  Total Tourniquet Time Documented: Thigh (Right) - 34 minutes Total: Thigh (Right) - 34 minutes     Complications- None  Condition-PACU - hemodynamically stable.   Brief Clinical Note  Michele Meyer is a 69 y.o. year old female with end stage OA of her right knee with progressively worsening pain and dysfunction. She has constant pain, with activity and at rest and significant functional deficits with difficulties even with ADLs. She has had extensive non-op management including analgesics, injections of cortisone and viscosupplements, and home exercise program, but remains in significant pain with significant dysfunction.Radiographs show bone on bone arthritis medial and patellofemoral. She presents now for right Total Knee Arthroplasty.    Procedure in detail---   The patient is brought into the operating room and positioned supine on the operating table. After successful administration of  Spinal,   a tourniquet is placed high on the  Right thigh(s) and the lower extremity is prepped and draped in the usual sterile fashion. Time out is performed by the operating team and then the  Right lower extremity is wrapped in Esmarch, knee flexed and the tourniquet inflated to 300 mmHg.       A midline incision is made with a ten blade through the subcutaneous tissue to the level of the extensor mechanism. A fresh blade is used to make a medial parapatellar arthrotomy. Soft tissue over the proximal medial tibia is subperiosteally elevated to the joint line with a knife and into the semimembranosus bursa with a Cobb elevator. Soft tissue over the proximal lateral tibia is elevated with  attention being paid to avoiding the patellar tendon on the tibial tubercle. The patella is everted, knee flexed 90 degrees and the ACL and PCL are removed. Findings are bone on bone medial and patellofemoral with large global osteophytes.        The drill is used to create a starting hole in the distal femur and the canal is thoroughly irrigated with sterile saline to remove the fatty contents. The 5 degree Right  valgus alignment guide is placed into the femoral canal and the distal femoral cutting block is pinned to remove 10 mm off the distal femur. Resection is made with an oscillating saw.      The tibia is subluxed forward and the menisci are removed. The extramedullary alignment guide is placed referencing proximally at the medial aspect of the tibial tubercle and distally along the second metatarsal axis and tibial crest. The block is pinned to remove 27mm off the more deficient medial  side. Resection is made with an oscillating saw. Size 5is the most appropriate size for the tibia and the proximal tibia is prepared with the modular drill and keel punch for that size.      The femoral sizing guide is placed and size 5 is most appropriate. Rotation is marked off the epicondylar axis and confirmed by creating a rectangular flexion gap at 90 degrees. The size 5 cutting block is pinned in this rotation and the anterior, posterior and chamfer cuts are made with the oscillating saw. The intercondylar block is then placed and that cut is made.      Trial size 5 tibial component, trial  size 5 posterior stabilized femur and a 8  mm posterior stabilized rotating platform insert trial is placed. Full extension is achieved with excellent varus/valgus and anterior/posterior balance throughout full range of motion. The patella is everted and thickness measured to be 22  mm. Free hand resection is taken to 12 mm, a 35 template is placed, lug holes are drilled, trial patella is placed, and it tracks normally.  Osteophytes are removed off the posterior femur with the trial in place. All trials are removed and the cut bone surfaces prepared with pulsatile lavage. Cement is mixed and once ready for implantation, the size 5 tibial implant, size  5 narrow  posterior stabilized femoral component, and the size 35 patella are cemented in place and the patella is held with the clamp. The trial insert is placed and the knee held in full extension. The Exparel (20 ml mixed with 30 ml saline) and .25% Bupivicaine, are injected into the extensor mechanism, posterior capsule, medial and lateral gutters and subcutaneous tissues.  All extruded cement is removed and once the cement is hard the permanent 8 mm posterior stabilized rotating platform insert is placed into the tibial tray.      The wound is copiously irrigated with saline solution and the extensor mechanism closed over a hemovac drain with #1 V-loc suture. The tourniquet is released for a total tourniquet time of 34  minutes. Flexion against gravity is 140 degrees and the patella tracks normally. Subcutaneous tissue is closed with 2.0 vicryl and subcuticular with running 4.0 Monocryl. The incision is cleaned and dried and steri-strips and a bulky sterile dressing are applied. The limb is placed into a knee immobilizer and the patient is awakened and transported to recovery in stable condition.      Please note that a surgical assistant was a medical necessity for this procedure in order to perform it in a safe and expeditious manner. Surgical assistant was necessary to retract the ligaments and vital neurovascular structures to prevent injury to them and also necessary for proper positioning of the limb to allow for anatomic placement of the prosthesis.   Dione Plover Adrina Armijo, MD    10/03/2015, 3:55 PM

## 2015-10-03 NOTE — Transfer of Care (Signed)
Immediate Anesthesia Transfer of Care Note  Patient: Michele Meyer  Procedure(s) Performed: Procedure(s): RIGHT TOTAL KNEE ARTHROPLASTY (Right)  Patient Location: PACU  Anesthesia Type:Spinal  Level of Consciousness: awake, alert  and oriented  Airway & Oxygen Therapy: Patient Spontanous Breathing and Patient connected to nasal cannula oxygen  Post-op Assessment: Report given to RN and Post -op Vital signs reviewed and stable  Post vital signs: Reviewed and stable  Last Vitals:  Filed Vitals:   10/03/15 1211  BP: 157/70  Pulse: 80  Temp: 36.8 C  Resp: 18    Complications: No apparent anesthesia complications

## 2015-10-04 ENCOUNTER — Encounter (HOSPITAL_COMMUNITY): Payer: Self-pay | Admitting: Orthopedic Surgery

## 2015-10-04 LAB — BASIC METABOLIC PANEL
ANION GAP: 5 (ref 5–15)
BUN: 11 mg/dL (ref 6–20)
CALCIUM: 8.8 mg/dL — AB (ref 8.9–10.3)
CO2: 24 mmol/L (ref 22–32)
Chloride: 98 mmol/L — ABNORMAL LOW (ref 101–111)
Creatinine, Ser: 0.77 mg/dL (ref 0.44–1.00)
GFR calc Af Amer: >60 mL/min (ref >60)
GLUCOSE: 180 mg/dL — AB (ref 65–99)
POTASSIUM: 4.4 mmol/L (ref 3.5–5.1)
SODIUM: 127 mmol/L — AB (ref 135–145)

## 2015-10-04 LAB — CBC
HEMATOCRIT: 28.5 % — AB (ref 36.0–46.0)
HEMOGLOBIN: 10 g/dL — AB (ref 12.0–15.0)
MCH: 29.1 pg (ref 26.0–34.0)
MCHC: 35.1 g/dL (ref 30.0–36.0)
MCV: 82.8 fL (ref 78.0–100.0)
Platelets: 196 10*3/uL (ref 150–400)
RBC: 3.44 MIL/uL — ABNORMAL LOW (ref 3.87–5.11)
RDW: 12.2 % (ref 11.5–15.5)
WBC: 12.4 10*3/uL — ABNORMAL HIGH (ref 4.0–10.5)

## 2015-10-04 LAB — GLUCOSE, CAPILLARY: Glucose-Capillary: 97 mg/dL (ref 65–99)

## 2015-10-04 MED ORDER — METHOCARBAMOL 500 MG PO TABS: 500.0000 mg | ORAL_TABLET | Freq: Four times a day (QID) | ORAL | Status: DC | PRN

## 2015-10-04 MED ORDER — TRAMADOL HCL 50 MG PO TABS: 50.0000 mg | ORAL_TABLET | Freq: Four times a day (QID) | ORAL | Status: DC | PRN

## 2015-10-04 MED ORDER — RIVAROXABAN 10 MG PO TABS: 10.0000 mg | ORAL_TABLET | Freq: Every day | ORAL | Status: DC

## 2015-10-04 MED ORDER — HYDROMORPHONE HCL 2 MG PO TABS: 2.0000 mg | ORAL_TABLET | ORAL | Status: DC | PRN

## 2015-10-04 NOTE — Discharge Summary (Signed)
Physician Discharge Summary   Patient ID: Michele Meyer MRN: [id number] DOB/AGE: 71-Jan-1947 71 y.o.  Admit date: 10/03/2015 Discharge date: 10-05-2015  Primary Diagnosis:  Osteoarthritis Right knee(s)  Admission Diagnoses:  Past Medical History  Diagnosis Date  . Anxiety   . Cystocele   . Incomplete bladder emptying   . Chronic cystitis   . Stress incontinence   . Depression   . UTI (lower urinary tract infection)   . Skin cancer   . Arthritis   . Endometriosis   . Glaucoma     both eyes  . Uterovaginal prolapse, incomplete   . Skin cancer     basal and squamous cell  . Labile hypertension   . Dyslipidemia   . Cyst of right kidney   . Diverticulosis   . Gestational diabetes mellitus 30 years ago    with pregnancy   . Bladder filling defect     bladder sling protrusing last 3 years   . PONV (postoperative nausea and vomiting)    Discharge Diagnoses:   Principal Problem:   OA (osteoarthritis) of knee  Estimated body mass index is 27.43 kg/(m^2) as calculated from the following:   Height as of this encounter: $RemoveBeforeD'5\' 2"'[id number]$  (1.575 m).   Weight as of this encounter: 68.04 kg (150 lb).  Procedure:  Procedure(s) (LRB): RIGHT TOTAL KNEE ARTHROPLASTY (Right)   Consults: None  HPI: Michele Meyer is a 69 y.o. year old female with end stage OA of her right knee with progressively worsening pain and dysfunction. She has constant pain, with activity and at rest and significant functional deficits with difficulties even with ADLs. She has had extensive non-op management including analgesics, injections of cortisone and viscosupplements, and home exercise program, but remains in significant pain with significant dysfunction.Radiographs show bone on bone arthritis medial and patellofemoral. She presents now for right Total Knee Arthroplasty. Laboratory Data: Admission on 10/03/2015  Component Date Value Ref Range Status  . WBC 10/04/2015 12.4* 4.0 - 10.5 K/uL Final  . RBC  10/04/2015 3.44* 3.87 - 5.11 MIL/uL Final  . Hemoglobin 10/04/2015 10.0* 12.0 - 15.0 g/dL Final  . HCT 10/04/2015 28.5* 36.0 - 46.0 % Final  . MCV 10/04/2015 82.8  78.0 - 100.0 fL Final  . MCH 10/04/2015 29.1  26.0 - 34.0 pg Final  . MCHC 10/04/2015 35.1  30.0 - 36.0 g/dL Final  . RDW 10/04/2015 12.2  11.5 - 15.5 % Final  . Platelets 10/04/2015 196  150 - 400 K/uL Final  . Sodium 10/04/2015 127* 135 - 145 mmol/L Final  . Potassium 10/04/2015 4.4  3.5 - 5.1 mmol/L Final  . Chloride 10/04/2015 98* 101 - 111 mmol/L Final  . CO2 10/04/2015 24  22 - 32 mmol/L Final  . Glucose, Bld 10/04/2015 180* 65 - 99 mg/dL Final  . BUN 10/04/2015 11  6 - 20 mg/dL Final  . Creatinine, Ser 10/04/2015 0.77  0.44 - 1.00 mg/dL Final  . Calcium 10/04/2015 8.8* 8.9 - 10.3 mg/dL Final  . GFR calc non Af Amer 10/04/2015 >60  >60 mL/min Final  . GFR calc Af Amer 10/04/2015 >60  >60 mL/min Final   Comment: (NOTE) The eGFR has been calculated using the CKD EPI equation. This calculation has not been validated in all clinical situations. eGFR's persistently <60 mL/min signify possible Chronic Kidney Disease.   . Anion gap 10/04/2015 5  5 - 15 Final  . Glucose-Capillary 10/04/2015 97  65 - 99 mg/dL Final  Hospital Outpatient  Visit on 09/28/2015  Component Date Value Ref Range Status  . aPTT 09/28/2015 31  24 - 37 seconds Final  . WBC 09/28/2015 7.3  4.0 - 10.5 K/uL Final  . RBC 09/28/2015 4.24  3.87 - 5.11 MIL/uL Final  . Hemoglobin 09/28/2015 12.3  12.0 - 15.0 g/dL Final  . HCT 09/28/2015 35.0* 36.0 - 46.0 % Final  . MCV 09/28/2015 82.5  78.0 - 100.0 fL Final  . MCH 09/28/2015 29.0  26.0 - 34.0 pg Final  . MCHC 09/28/2015 35.1  30.0 - 36.0 g/dL Final  . RDW 09/28/2015 12.4  11.5 - 15.5 % Final  . Platelets 09/28/2015 260  150 - 400 K/uL Final  . Sodium 09/28/2015 128* 135 - 145 mmol/L Final  . Potassium 09/28/2015 4.0  3.5 - 5.1 mmol/L Final  . Chloride 09/28/2015 92* 101 - 111 mmol/L Final  . CO2  09/28/2015 29  22 - 32 mmol/L Final  . Glucose, Bld 09/28/2015 95  65 - 99 mg/dL Final  . BUN 09/28/2015 16  6 - 20 mg/dL Final  . Creatinine, Ser 09/28/2015 0.95  0.44 - 1.00 mg/dL Final  . Calcium 09/28/2015 9.4  8.9 - 10.3 mg/dL Final  . Total Protein 09/28/2015 7.6  6.5 - 8.1 g/dL Final  . Albumin 09/28/2015 4.5  3.5 - 5.0 g/dL Final  . AST 09/28/2015 21  15 - 41 U/L Final  . ALT 09/28/2015 17  14 - 54 U/L Final  . Alkaline Phosphatase 09/28/2015 85  38 - 126 U/L Final  . Total Bilirubin 09/28/2015 0.3  0.3 - 1.2 mg/dL Final  . GFR calc non Af Amer 09/28/2015 59* >60 mL/min Final  . GFR calc Af Amer 09/28/2015 >60  >60 mL/min Final   Comment: (NOTE) The eGFR has been calculated using the CKD EPI equation. This calculation has not been validated in all clinical situations. eGFR's persistently <60 mL/min signify possible Chronic Kidney Disease.   . Anion gap 09/28/2015 7  5 - 15 Final  . Prothrombin Time 09/28/2015 13.6  11.6 - 15.2 seconds Final  . INR 09/28/2015 1.02  0.00 - 1.49 Final  . ABO/RH(D) 09/28/2015 A NEG   Final  . Antibody Screen 09/28/2015 NEG   Final  . Sample Expiration 09/28/2015 10/06/2015   Final  . Color, Urine 09/28/2015 YELLOW  YELLOW Final  . APPearance 09/28/2015 CLEAR  CLEAR Final  . Specific Gravity, Urine 09/28/2015 1.014  1.005 - 1.030 Final  . pH 09/28/2015 7.0  5.0 - 8.0 Final  . Glucose, UA 09/28/2015 NEGATIVE  NEGATIVE mg/dL Final  . Hgb urine dipstick 09/28/2015 NEGATIVE  NEGATIVE Final  . Bilirubin Urine 09/28/2015 NEGATIVE  NEGATIVE Final  . Ketones, ur 09/28/2015 NEGATIVE  NEGATIVE mg/dL Final  . Protein, ur 09/28/2015 NEGATIVE  NEGATIVE mg/dL Final  . Urobilinogen, UA 09/28/2015 1.0  0.0 - 1.0 mg/dL Final  . Nitrite 09/28/2015 NEGATIVE  NEGATIVE Final  . Leukocytes, UA 09/28/2015 MODERATE* NEGATIVE Final  . MRSA, PCR 09/28/2015 NEGATIVE  NEGATIVE Final  . Staphylococcus aureus 09/28/2015 NEGATIVE  NEGATIVE Final   Comment:        The  Xpert SA Assay (FDA approved for NASAL specimens in patients over 80 years of age), is one component of a comprehensive surveillance program.  Test performance has been validated by [facility] for patients greater than or equal to 36 year old. It is not intended to diagnose infection nor to guide or monitor treatment.   . Squamous  Epithelial / LPF 09/28/2015 FEW* RARE Final  . WBC, UA 09/28/2015 3-6  <3 WBC/hpf Final  . RBC / HPF 09/28/2015 0-2  <3 RBC/hpf Final  . Bacteria, UA 09/28/2015 RARE  RARE Final  . ABO/RH(D) 09/28/2015 A NEG   Final     X-Rays:No results found.  EKG: Orders placed or performed in visit on 09/16/15  . EKG 12-Lead     Hospital Course: Michele Meyer is a 69 y.o. who was admitted to [facility]. They were brought to the operating room on 10/03/2015 and underwent Procedure(s): RIGHT TOTAL KNEE ARTHROPLASTY.  Patient tolerated the procedure well and was later transferred to the recovery room and then to the orthopaedic floor for postoperative care.  They were given PO and IV analgesics for pain control following their surgery.  They were given 24 hours of postoperative antibiotics of  Anti-infectives    Start     Dose/Rate Route Frequency Ordered Stop   10/04/15 0600  ceFAZolin (ANCEF) IVPB 2 g/50 mL premix     2 g 100 mL/hr over 30 Minutes Intravenous On call to O.R. 10/03/15 1259 10/03/15 1452   10/03/15 2100  ceFAZolin (ANCEF) IVPB 2 g/50 mL premix     2 g 100 mL/hr over 30 Minutes Intravenous Every 6 hours 10/03/15 1735 10/04/15 0400     and started on DVT prophylaxis in the form of Xarelto.   PT and OT were ordered for total joint protocol.  Discharge planning consulted to help with postop disposition and equipment needs.  Patient had a decent night on the evening of surgery.  They started to get up OOB with therapy on day one. Hemovac drain was pulled without difficulty.  Continued to work with therapy into day two.  Dressing was changed on  day two and the incision was healing well. Patient was seen in rounds and was ready to go home.  Discharge home with home health Diet - Cardiac diet Follow up - in 2 weeks Activity - WBAT Disposition - Home Condition Upon Discharge - Good D/C Meds - See DC Summary DVT Prophylaxis - Xarelto  Discharge Instructions    Call MD / Call 911    Complete by:  As directed   If you experience chest pain or shortness of breath, CALL 911 and be transported to the hospital emergency room.  If you develope a fever above 101 F, pus (white drainage) or increased drainage or redness at the wound, or calf pain, call your surgeon's office.     Change dressing    Complete by:  As directed   Change dressing daily with sterile 4 x 4 inch gauze dressing and apply TED hose. Do not submerge the incision under water.     Constipation Prevention    Complete by:  As directed   Drink plenty of fluids.  Prune juice may be helpful.  You may use a stool softener, such as Colace (over the counter) 100 mg twice a day.  Use MiraLax (over the counter) for constipation as needed.     Diet - low sodium heart healthy    Complete by:  As directed      Diet Carb Modified    Complete by:  As directed      Discharge instructions    Complete by:  As directed   Pick up stool softner and laxative for home use following surgery while on pain medications. Do not submerge incision under water. Please use good hand  washing techniques while changing dressing each day. May shower starting three days after surgery. Please use a clean towel to pat the incision dry following showers. Continue to use ice for pain and swelling after surgery. Do not use any lotions or creams on the incision until instructed by your surgeon.  Take Xarelto for two and a half more weeks, then discontinue Xarelto. Once the patient has completed the blood thinner regimen, then take a Baby 81 mg Aspirin daily for three more weeks.  Postoperative Constipation  Protocol  Constipation - defined medically as fewer than three stools per week and severe constipation as less than one stool per week.  One of the most common issues patients have following surgery is constipation.  Even if you have a regular bowel pattern at home, your normal regimen is likely to be disrupted due to multiple reasons following surgery.  Combination of anesthesia, postoperative narcotics, change in appetite and fluid intake all can affect your bowels.  In order to avoid complications following surgery, here are some recommendations in order to help you during your recovery period.  Colace (docusate) - Pick up an over-the-counter form of Colace or another stool softener and take twice a day as long as you are requiring postoperative pain medications.  Take with a full glass of water daily.  If you experience loose stools or diarrhea, hold the colace until you stool forms back up.  If your symptoms do not get better within 1 week or if they get worse, check with your doctor.  Dulcolax (bisacodyl) - Pick up over-the-counter and take as directed by the product packaging as needed to assist with the movement of your bowels.  Take with a full glass of water.  Use this product as needed if not relieved by Colace only.   MiraLax (polyethylene glycol) - Pick up over-the-counter to have on hand.  MiraLax is a solution that will increase the amount of water in your bowels to assist with bowel movements.  Take as directed and can mix with a glass of water, juice, soda, coffee, or tea.  Take if you go more than two days without a movement. Do not use MiraLax more than once per day. Call your doctor if you are still constipated or irregular after using this medication for 7 days in a row.  If you continue to have problems with postoperative constipation, please contact the office for further assistance and recommendations.  If you experience "the worst abdominal pain ever" or develop nausea or  vomiting, please contact the office immediatly for further recommendations for treatment.     Do not put a pillow under the knee. Place it under the heel.    Complete by:  As directed      Do not sit on low chairs, stoools or toilet seats, as it may be difficult to get up from low surfaces    Complete by:  As directed      Driving restrictions    Complete by:  As directed   No driving until released by the physician.     Increase activity slowly as tolerated    Complete by:  As directed      Lifting restrictions    Complete by:  As directed   No lifting until released by the physician.     Patient may shower    Complete by:  As directed   You may shower without a dressing once there is no drainage.  Do not wash over  the wound.  If drainage remains, do not shower until drainage stops.     TED hose    Complete by:  As directed   Use stockings (TED hose) for 3 weeks on both leg(s).  You may remove them at night for sleeping.     Weight bearing as tolerated    Complete by:  As directed   Laterality:  right  Extremity:  Lower            Medication List    STOP taking these medications        meloxicam 15 MG tablet  Commonly known as:  MOBIC      TAKE these medications        ALPRAZolam 0.25 MG tablet  Commonly known as:  XANAX  Take 1 tablet (0.25 mg total) by mouth 2 (two) times daily. 15 days supply     fluticasone 50 MCG/ACT nasal spray  Commonly known as:  FLONASE  Place 2 sprays into both nostrils daily.     HYDROmorphone 2 MG tablet  Commonly known as:  DILAUDID  Take 1-2 tablets (2-4 mg total) by mouth every 4 (four) hours as needed for severe pain.     latanoprost 0.005 % ophthalmic solution  Commonly known as:  XALATAN  Place 1 drop into both eyes at bedtime.     methocarbamol 500 MG tablet  Commonly known as:  ROBAXIN  Take 1 tablet (500 mg total) by mouth every 6 (six) hours as needed for muscle spasms.     metoprolol succinate 25 MG 24 hr tablet    Commonly known as:  TOPROL-XL  Take 0.5 tablets (12.5 mg total) by mouth every 12 (twelve) hours.     nystatin 100000 UNIT/GM Powd  APPLY TO AFFECTED AREA 3 TIMES DAILY     pantoprazole 20 MG tablet  Commonly known as:  PROTONIX  Take 2 tablets (40 mg total) by mouth daily.     rivaroxaban 10 MG Tabs tablet  Commonly known as:  XARELTO  Take 1 tablet (10 mg total) by mouth daily with breakfast. Take Xarelto for two and a half more weeks, then discontinue Xarelto. Once the patient has completed the blood thinner regimen, then take a Baby 81 mg Aspirin daily for three more weeks.     sertraline 100 MG tablet  Commonly known as:  ZOLOFT  Take 1 tablet (100 mg total) by mouth daily.     traMADol 50 MG tablet  Commonly known as:  ULTRAM  Take 1-2 tablets (50-100 mg total) by mouth every 6 (six) hours as needed (mild pain).     valsartan-hydrochlorothiazide 160-25 MG tablet  Commonly known as:  DIOVAN-HCT  Take 1 tablet by mouth daily.           Follow-up Information    Follow up with Gearlean Alf, MD. Schedule an appointment as soon as possible for a visit on 10/18/2015.   Specialty:  Orthopedic Surgery   Why:  Call office at [phone] to setup appointment with Dr. Wynelle Link on Tuesday 10/18/2015.   Contact information:   [address] [place] [address] [phone]       Signed: Arlee Muslim, PA-C Orthopaedic Surgery 10/04/2015, 9:39 PM

## 2015-10-04 NOTE — Evaluation (Signed)
Physical Therapy Evaluation Patient Details Name: Michele Meyer MRN: [id number] DOB: 1946/07/03 Today's Date: 10/04/2015   History of Present Illness  Pt is a 69 year old female s/p R TKA. Pt with PMH of HTN, depression  Clinical Impression  Pt is s/p R TKA resulting in the deficits listed below (see PT Problem List).   Pt will benefit from skilled PT to increase their independence and safety with mobility to allow discharge to the venue listed below.  Pt presents with slow mobility and increased time to process (possibly due to pain meds) at this time.        Follow Up Recommendations Home health PT;Supervision - Intermittent    Equipment Recommendations  Rolling walker with 5" wheels    Recommendations for Other Services       Precautions / Restrictions Precautions Precautions: Knee Required Braces or Orthoses: Knee Immobilizer - Right Knee Immobilizer - Right: Discontinue once straight leg raise with < 10 degree lag Restrictions Weight Bearing Restrictions: No Other Position/Activity Restrictions: WBAT      Mobility  Bed Mobility               General bed mobility comments: pt up in recliner on arrival  Transfers Overall transfer level: Needs assistance Equipment used: Rolling walker (2 wheeled) Transfers: Sit to/from Stand Sit to Stand: Min assist         General transfer comment: verbal cues for safe technique, assist to rise and steady  Ambulation/Gait Ambulation/Gait assistance: Min guard Ambulation Distance (Feet): 20 Feet Assistive device: Rolling walker (2 wheeled) Gait Pattern/deviations: Step-to pattern;Antalgic     General Gait Details: verbal cues for sequence, RW positioning, posture, increased time to process and perform  Stairs            Wheelchair Mobility    Modified Rankin (Stroke Patients Only)       Balance                                             Pertinent Vitals/Pain Pain Assessment:  0-10 Pain Score: 6  Pain Location: R knee Pain Descriptors / Indicators: Sore;Aching Pain Intervention(s): Monitored during session;Repositioned (brought ice for OT to apply)    Home Living Family/patient expects to be discharged to:: Private residence Living Arrangements: Spouse/significant other Available Help at Discharge: Family Type of Home: House         Home Equipment: None      Prior Function Level of Independence: Independent               Hand Dominance        Extremity/Trunk Assessment   Upper Extremity Assessment: Overall WFL for tasks assessed           Lower Extremity Assessment: RLE deficits/detail RLE Deficits / Details: maintained KI due to pain       Communication   Communication: No difficulties  Cognition Arousal/Alertness: Awake/alert Behavior During Therapy: WFL for tasks assessed/performed Overall Cognitive Status: Impaired/Different from baseline Area of Impairment: Problem solving             Problem Solving: Slow processing;Requires verbal cues      General Comments      Exercises        Assessment/Plan    PT Assessment Patient needs continued PT services  PT Diagnosis Difficulty walking;Acute pain   PT Problem List Decreased  strength;Decreased activity tolerance;Decreased mobility;Pain;Decreased knowledge of use of DME;Decreased knowledge of precautions;Decreased range of motion  PT Treatment Interventions Functional mobility training;Gait training;Stair training;DME instruction;Patient/family education;Therapeutic activities;Therapeutic exercise   PT Goals (Current goals can be found in the Care Plan section) Acute Rehab PT Goals Patient Stated Goal: none stated but pt agreeable to OT PT Goal Formulation: With patient Time For Goal Achievement: 10/08/15 Potential to Achieve Goals: Good    Frequency 7X/week   Barriers to discharge        Co-evaluation               End of Session Equipment  Utilized During Treatment: Gait belt;Right knee immobilizer Activity Tolerance: Patient limited by fatigue Patient left: Other (comment) (up in room with OT ambulating to bathroom)           Time: 3976-7341 PT Time Calculation (min) (ACUTE ONLY): 14 min   Charges:   PT Evaluation $Initial PT Evaluation Tier I: 1 Procedure     PT G Codes:        Symphany Fleissner,KATHrine E 10/04/2015, 1:30 PM Carmelia Bake, PT, DPT 10/04/2015 Pager: [phone]

## 2015-10-04 NOTE — Progress Notes (Addendum)
   Subjective: 1 Day Post-Op Procedure(s) (LRB): RIGHT TOTAL KNEE ARTHROPLASTY (Right) Patient reports pain as moderate.   Patient seen in rounds with Dr. Wynelle Link. Husband in room. Patient is having problems with pain in the knee, requiring pain medications We will start therapy today.  Plan is to go Home after hospital stay.  Objective: Vital signs in last 24 hours: Temp:  [96.4 F (35.8 C)-98.4 F (36.9 C)] 98.4 F (36.9 C) (10/11 0622) Pulse Rate:  [72-91] 76 (10/11 0622) Resp:  [8-18] 16 (10/11 0622) BP: (127-157)/(53-70) 144/54 mmHg (10/11 0622) SpO2:  [98 %-100 %] 100 % (10/11 0622) Weight:  [68.04 kg (150 lb)-68.38 kg (150 lb 12 oz)] 68.04 kg (150 lb) (10/10 1740)  Intake/Output from previous day:  Intake/Output Summary (Last 24 hours) at 10/04/15 0719 Last data filed at 10/04/15 1937  Gross per 24 hour  Intake   3430 ml  Output   1797 ml  Net   1633 ml    Intake/Output this shift: uop over 600 since around [place]  Labs:  Recent Labs  10/04/15 0517  HGB 10.0*    Recent Labs  10/04/15 0517  WBC 12.4*  RBC 3.44*  HCT 28.5*  PLT 196    Recent Labs  10/04/15 0517  NA 127*  K 4.4  CL 98*  CO2 24  BUN 11  CREATININE 0.77  GLUCOSE 180*  CALCIUM 8.8*   No results for input(s): LABPT, INR in the last 72 hours.  EXAM General - Patient is Alert, Appropriate and Oriented Extremity - Neurovascular intact Sensation intact distally Dressing - dressing C/D/I Motor Function - intact, moving foot and toes well on exam.  Hemovac pulled and appeared to be completely intact.  Came out with some difficulty.  Past Medical History  Diagnosis Date  . Anxiety   . Cystocele   . Incomplete bladder emptying   . Chronic cystitis   . Stress incontinence   . Depression   . UTI (lower urinary tract infection)   . Skin cancer   . Arthritis   . Endometriosis   . Glaucoma     both eyes  . Uterovaginal prolapse, incomplete   . Skin cancer     basal and squamous  cell  . Labile hypertension   . Dyslipidemia   . Cyst of right kidney   . Diverticulosis   . Gestational diabetes mellitus 30 years ago    with pregnancy   . Bladder filling defect     bladder sling protrusing last 3 years   . PONV (postoperative nausea and vomiting)     Assessment/Plan: 1 Day Post-Op Procedure(s) (LRB): RIGHT TOTAL KNEE ARTHROPLASTY (Right) Principal Problem:   OA (osteoarthritis) of knee  Estimated body mass index is 27.43 kg/(m^2) as calculated from the following:   Height as of this encounter: 5\' 2"  (1.575 m).   Weight as of this encounter: 68.04 kg (150 lb). Advance diet Up with therapy Discharge home with home health  Low sodium but chronic in nature  DVT Prophylaxis - Xarelto Weight-Bearing as tolerated to right leg D/C O2 and Pulse OX and try on Room Air  Arlee Muslim, PA-C Orthopaedic Surgery 10/04/2015, 7:19 AM

## 2015-10-04 NOTE — Discharge Instructions (Addendum)
° °Dr. Frank Aluisio °Total Joint Specialist °[facility] °[address] °[place], [place] [address] °[phone] ° °TOTAL KNEE REPLACEMENT POSTOPERATIVE DIRECTIONS ° °Knee Rehabilitation, Guidelines Following Surgery  °Results after knee surgery are often greatly improved when you follow the exercise, range of motion and muscle strengthening exercises prescribed by your doctor. Safety measures are also important to protect the knee from further injury. Any time any of these exercises cause you to have increased pain or swelling in your knee joint, decrease the amount until you are comfortable again and slowly increase them. If you have problems or questions, call your caregiver or physical therapist for advice.  ° °HOME CARE INSTRUCTIONS  °Remove items at home which could result in a fall. This includes throw rugs or furniture in walking pathways.  °· ICE to the affected knee every three hours for 30 minutes at a time and then as needed for pain and swelling.  Continue to use ice on the knee for pain and swelling from surgery. You may notice swelling that will progress down to the foot and ankle.  This is normal after surgery.  Elevate the leg when you are not up walking on it.   °· Continue to use the breathing machine which will help keep your temperature down.  It is common for your temperature to cycle up and down following surgery, especially at night when you are not up moving around and exerting yourself.  The breathing machine keeps your lungs expanded and your temperature down. °· Do not place pillow under knee, focus on keeping the knee straight while resting ° °DIET °You may resume your previous home diet once your are discharged from the hospital. ° °DRESSING / WOUND CARE / SHOWERING °You may shower 3 days after surgery, but keep the wounds dry during showering.  You may use an occlusive plastic wrap (Press'n Seal for example), NO SOAKING/SUBMERGING IN THE BATHTUB.  If the  bandage gets wet, change with a clean dry gauze.  If the incision gets wet, pat the wound dry with a clean towel. °You may start showering once you are discharged home but do not submerge the incision under water. Just pat the incision dry and apply a dry gauze dressing on daily. °Change the surgical dressing daily and reapply a dry dressing each time. ° °ACTIVITY °Walk with your walker as instructed. °Use walker as long as suggested by your caregivers. °Avoid periods of inactivity such as sitting longer than an hour when not asleep. This helps prevent blood clots.  °You may resume a sexual relationship in one month or when given the OK by your doctor.  °You may return to work once you are cleared by your doctor.  °Do not drive a car for 6 weeks or until released by you surgeon.  °Do not drive while taking narcotics. ° °WEIGHT BEARING °Weight bearing as tolerated with assist device (walker, cane, etc) as directed, use it as long as suggested by your surgeon or therapist, typically at least 4-6 weeks. ° °POSTOPERATIVE CONSTIPATION PROTOCOL °Constipation - defined medically as fewer than three stools per week and severe constipation as less than one stool per week. ° °One of the most common issues patients have following surgery is constipation.  Even if you have a regular bowel pattern at home, your normal regimen is likely to be disrupted due to multiple reasons following surgery.  Combination of anesthesia, postoperative narcotics, change in appetite and fluid intake all can affect your bowels.    In order to avoid complications following surgery, here are some recommendations in order to help you during your recovery period. ° °Colace (docusate) - Pick up an over-the-counter form of Colace or another stool softener and take twice a day as long as you are requiring postoperative pain medications.  Take with a full glass of water daily.  If you experience loose stools or diarrhea, hold the colace until you stool forms  back up.  If your symptoms do not get better within 1 week or if they get worse, check with your doctor. ° °Dulcolax (bisacodyl) - Pick up over-the-counter and take as directed by the product packaging as needed to assist with the movement of your bowels.  Take with a full glass of water.  Use this product as needed if not relieved by Colace only.  ° °MiraLax (polyethylene glycol) - Pick up over-the-counter to have on hand.  MiraLax is a solution that will increase the amount of water in your bowels to assist with bowel movements.  Take as directed and can mix with a glass of water, juice, soda, coffee, or tea.  Take if you go more than two days without a movement. °Do not use MiraLax more than once per day. Call your doctor if you are still constipated or irregular after using this medication for 7 days in a row. ° °If you continue to have problems with postoperative constipation, please contact the office for further assistance and recommendations.  If you experience "the worst abdominal pain ever" or develop nausea or vomiting, please contact the office immediatly for further recommendations for treatment. ° °ITCHING ° If you experience itching with your medications, try taking only a single pain pill, or even half a pain pill at a time.  You can also use Benadryl over the counter for itching or also to help with sleep.  ° °TED HOSE STOCKINGS °Wear the elastic stockings on both legs for three weeks following surgery during the day but you may remove then at night for sleeping. ° °MEDICATIONS °See your medication summary on the “After Visit Summary” that the nursing staff will review with you prior to discharge.  You may have some home medications which will be placed on hold until you complete the course of blood thinner medication.  It is important for you to complete the blood thinner medication as prescribed by your surgeon.  Continue your approved medications as instructed at time of  discharge. ° °PRECAUTIONS °If you experience chest pain or shortness of breath - call 911 immediately for transfer to the hospital emergency department.  °If you develop a fever greater that 101 F, purulent drainage from wound, increased redness or drainage from wound, foul odor from the wound/dressing, or calf pain - CONTACT YOUR SURGEON.   °                                                °FOLLOW-UP APPOINTMENTS °Make sure you keep all of your appointments after your operation with your surgeon and caregivers. You should call the office at the above phone number and make an appointment for approximately two weeks after the date of your surgery or on the date instructed by your surgeon outlined in the "After Visit Summary". ° ° °RANGE OF MOTION AND STRENGTHENING EXERCISES  °Rehabilitation of the knee is important following a knee injury or   an operation. After just a few days of immobilization, the muscles of the thigh which control the knee become weakened and shrink (atrophy). Knee exercises are designed to build up the tone and strength of the thigh muscles and to improve knee motion. Often times heat used for twenty to thirty minutes before working out will loosen up your tissues and help with improving the range of motion but do not use heat for the first two weeks following surgery. These exercises can be done on a training (exercise) mat, on the floor, on a table or on a bed. Use what ever works the best and is most comfortable for you Knee exercises include:  °Leg Lifts - While your knee is still immobilized in a splint or cast, you can do straight leg raises. Lift the leg to 60 degrees, hold for 3 sec, and slowly lower the leg. Repeat 10-20 times 2-3 times daily. Perform this exercise against resistance later as your knee gets better.  °Quad and Hamstring Sets - Tighten up the muscle on the front of the thigh (Quad) and hold for 5-10 sec. Repeat this 10-20 times hourly. Hamstring sets are done by pushing the  foot backward against an object and holding for 5-10 sec. Repeat as with quad sets.  °· Leg Slides: Lying on your back, slowly slide your foot toward your buttocks, bending your knee up off the floor (only go as far as is comfortable). Then slowly slide your foot back down until your leg is flat on the floor again. °· Angel Wings: Lying on your back spread your legs to the side as far apart as you can without causing discomfort.  °A rehabilitation program following serious knee injuries can speed recovery and prevent re-injury in the future due to weakened muscles. Contact your doctor or a physical therapist for more information on knee rehabilitation.  ° °IF YOU ARE TRANSFERRED TO A SKILLED REHAB FACILITY °If the patient is transferred to a skilled rehab facility following release from the hospital, a list of the current medications will be sent to the facility for the patient to continue.  When discharged from the skilled rehab facility, please have the facility set up the patient's Home Health Physical Therapy prior to being released. Also, the skilled facility will be responsible for providing the patient with their medications at time of release from the facility to include their pain medication, the muscle relaxants, and their blood thinner medication. If the patient is still at the rehab facility at time of the two week follow up appointment, the skilled rehab facility will also need to assist the patient in arranging follow up appointment in our office and any transportation needs. ° °MAKE SURE YOU:  °Understand these instructions.  °Get help right away if you are not doing well or get worse.  ° ° °Pick up stool softner and laxative for home use following surgery while on pain medications. °Do not submerge incision under water. °Please use good hand washing techniques while changing dressing each day. °May shower starting three days after surgery. °Please use a clean towel to pat the incision dry following  showers. °Continue to use ice for pain and swelling after surgery. °Do not use any lotions or creams on the incision until instructed by your surgeon. ° °Take Xarelto for two and a half more weeks, then discontinue Xarelto. °Once the patient has completed the blood thinner regimen, then take a Baby 81 mg Aspirin daily for three more weeks. ° ° °Information   on my medicine - XARELTO (Rivaroxaban)  This medication education was reviewed with me or my healthcare representative as part of my discharge preparation.  The pharmacist that spoke with me during my hospital stay was:  Minda Ditto, [facility]  Why was Xarelto prescribed for you? Xarelto was prescribed for you to reduce the risk of blood clots forming after orthopedic surgery. The medical term for these abnormal blood clots is venous thromboembolism (VTE).  What do you need to know about xarelto ? Take your Xarelto ONCE DAILY at the same time every day. You may take it either with or without food.  If you have difficulty swallowing the tablet whole, you may crush it and mix in applesauce just prior to taking your dose.  Take Xarelto exactly as prescribed by your doctor and DO NOT stop taking Xarelto without talking to the doctor who prescribed the medication.  Stopping without other VTE prevention medication to take the place of Xarelto may increase your risk of developing a clot.  After discharge, you should have regular check-up appointments with your healthcare provider that is prescribing your Xarelto.    What do you do if you miss a dose? If you miss a dose, take it as soon as you remember on the same day then continue your regularly scheduled once daily regimen the next day. Do not take two doses of Xarelto on the same day.   Important Safety Information A possible side effect of Xarelto is bleeding. You should call your healthcare provider right away if you experience any of the following: ? Bleeding from an injury or your nose  that does not stop. ? Unusual colored urine (red or dark brown) or unusual colored stools (red or black). ? Unusual bruising for unknown reasons. ? A serious fall or if you hit your head (even if there is no bleeding).  Some medicines may interact with Xarelto and might increase your risk of bleeding while on Xarelto. To help avoid this, consult your healthcare provider or pharmacist prior to using any new prescription or non-prescription medications, including herbals, vitamins, non-steroidal anti-inflammatory drugs (NSAIDs) and supplements.  We discussed Mobic and risk of bleed  This website has more information on Xarelto: [email or web address]

## 2015-10-04 NOTE — Progress Notes (Signed)
Physical Therapy Treatment Note    10/04/15 1600  PT Visit Information  Last PT Received On 10/04/15  Assistance Needed +1  History of Present Illness Pt is a 69 year old female s/p R TKA. Pt with PMH of HTN, depression  PT Time Calculation  PT Start Time (ACUTE ONLY) 1348  PT Stop Time (ACUTE ONLY) 1429  PT Time Calculation (min) (ACUTE ONLY) 41 min  Subjective Data  Subjective Pt ambulated in hallway, assisted to bathroom and then performed exercises upon return to bed.  Precautions  Precautions Knee  Required Braces or Orthoses Knee Immobilizer - Right  Knee Immobilizer - Right Discontinue once straight leg raise with < 10 degree lag  Restrictions  Other Position/Activity Restrictions WBAT  Pain Assessment  Pain Assessment 0-10  Pain Score 4  Pain Location R knee  Pain Descriptors / Indicators Aching;Sore  Pain Intervention(s) Limited activity within patient's tolerance;Monitored during session;Repositioned;Ice applied  Cognition  Arousal/Alertness Awake/alert  Behavior During Therapy WFL for tasks assessed/performed  Overall Cognitive Status Within Functional Limits for tasks assessed (improved this afternoon compared to this morning)  Bed Mobility  Overal bed mobility Needs Assistance  Bed Mobility Sit to Supine  Sit to supine Min assist  General bed mobility comments slight assist for LE onto bed  Transfers  Overall transfer level Needs assistance  Equipment used Rolling walker (2 wheeled)  Transfers Sit to/from Stand  Sit to Stand Min guard  General transfer comment verbal cues for safe technique, assist to rise and steady  Ambulation/Gait  Ambulation/Gait assistance Min guard  Ambulation Distance (Feet) 40 Feet  Assistive device Rolling walker (2 wheeled)  Gait Pattern/deviations Step-to pattern;Antalgic  General Gait Details verbal cues for sequence, RW positioning, posture  Exercises  Exercises Total Joint  Total Joint Exercises  Ankle Circles/Pumps  AROM;Both;10 reps  Quad Sets AROM;Right;10 reps  Short Arc [employer];Right;10 reps  Heel Slides AAROM;Right;10 reps  Hip ABduction/ADduction AROM;Right;10 reps  Straight Leg Raises AROM;Right;10 reps  Goniometric ROM approx 75* AAROM knee flexion during Heel Slides  PT - End of Session  Equipment Utilized During Treatment Right knee immobilizer  Activity Tolerance Patient limited by fatigue  Patient left in bed;with call bell/phone within reach;with family/visitor present  PT - Assessment/Plan  PT Plan Current plan remains appropriate  PT Frequency (ACUTE ONLY) 7X/week  Follow Up Recommendations Home health PT;Supervision - Intermittent  PT equipment Rolling walker with 5" wheels  PT Goal Progression  Progress towards PT goals Progressing toward goals  PT General Charges  $$ ACUTE PT VISIT 1 Procedure  PT Treatments  $Gait Training 8-22 mins  $Therapeutic Exercise 8-22 mins   Carmelia Bake, PT, DPT 10/04/2015 Pager: [phone]

## 2015-10-04 NOTE — Progress Notes (Signed)
Utilization review completed.  

## 2015-10-04 NOTE — Evaluation (Signed)
Occupational Therapy Evaluation Patient Details Name: Michele Meyer MRN: [id number] DOB: 08-14-1946 Today's Date: 10/04/2015    History of Present Illness pt is s/p R TKA. Pt with PMH of HTN, depression   Clinical Impression   Pt limited by overall fatigue and states her UEs were getting tired from gripping to walker. She was slow to process commands at times--overall alittle groggy. Cues to open eyes when moving around room as she sometimes had eyes closed. Daughter present for session. Will follow on acute to progress ADL independence.    Follow Up Recommendations  No OT follow up;Supervision/Assistance - 24 hour    Equipment Recommendations  3 in 1 bedside comode;Other (comment) (RW (OT))    Recommendations for Other Services       Precautions / Restrictions Precautions Precautions: Knee Required Braces or Orthoses: Knee Immobilizer - Right Knee Immobilizer - Right: Discontinue once straight leg raise with < 10 degree lag Restrictions Weight Bearing Restrictions: No      Mobility Bed Mobility               General bed mobility comments: in chair.   Transfers Overall transfer level: Needs assistance Equipment used: Rolling walker (2 wheeled) Transfers: Sit to/from Stand Sit to Stand: Min assist         General transfer comment: slight min assist to stand from and descent to 3in1 as pt fatigued. cues hand placement and LE management.    Balance                                            ADL Overall ADL's : Needs assistance/impaired Eating/Feeding: Independent;Sitting   Grooming: Wash/dry hands;Minimal assistance;Standing   Upper Body Bathing: Set up;Sitting   Lower Body Bathing: Moderate assistance;Sit to/from stand   Upper Body Dressing : Set up;Sitting   Lower Body Dressing: Maximal assistance;Sit to/from stand   Toilet Transfer: Minimal assistance;Ambulation;BSC;RW   Toileting- Clothing Manipulation and Hygiene:  Moderate assistance;Sit to/from stand         General ADL Comments: Pt up to 3in1 but needed encouragement to continue with tasks at times. She was limited by nausea earlier this am but primarily limited by fatigue and UEs getting tired during this session. Daughter present for eval. DIscussed 3in1 and uses including for the shower. Pt required increased time for some commands at times.      Vision     Perception     Praxis      Pertinent Vitals/Pain Pain Assessment: 0-10 Pain Score: 7  Pain Location: R knee, shoulders Pain Descriptors / Indicators: Aching Pain Intervention(s): Repositioned;Ice applied     Hand Dominance     Extremity/Trunk Assessment Upper Extremity Assessment Upper Extremity Assessment: Overall WFL for tasks assessed           Communication Communication Communication: No difficulties   Cognition Arousal/Alertness: Awake/alert Behavior During Therapy: WFL for tasks assessed/performed Overall Cognitive Status: Impaired/Different from baseline Area of Impairment: Problem solving             Problem Solving: Slow processing     General Comments       Exercises       Shoulder Instructions      Home Living Family/patient expects to be discharged to:: Private residence Living Arrangements: Spouse/significant other Available Help at Discharge: Family Type of Home: House  Bathroom Shower/Tub: Occupational psychologist: Standard     Home Equipment: None          Prior Functioning/Environment Level of Independence: Independent             OT Diagnosis: Generalized weakness   OT Problem List: Decreased strength;Decreased knowledge of use of DME or AE   OT Treatment/Interventions: Self-care/ADL training;Patient/family education;Therapeutic activities;DME and/or AE instruction    OT Goals(Current goals can be found in the care plan section) Acute Rehab OT Goals Patient Stated Goal: none stated but pt  agreeable to OT OT Goal Formulation: With patient Time For Goal Achievement: 10/11/15 Potential to Achieve Goals: Good  OT Frequency: Min 2X/week   Barriers to D/C:            Co-evaluation              End of Session Equipment Utilized During Treatment: Gait belt;Rolling walker;Right knee immobilizer  Activity Tolerance: Patient limited by fatigue Patient left: in chair;with call bell/phone within reach;with family/visitor present   Time: 1150-1219 OT Time Calculation (min): 29 min Charges:  OT General Charges $OT Visit: 1 Procedure OT Evaluation $Initial OT Evaluation Tier I: 1 Procedure OT Treatments $Therapeutic Activity: 8-22 mins G-Codes:    Jules Schick  [phone] 10/04/2015, 1:22 PM

## 2015-10-05 LAB — CBC
HEMATOCRIT: 26.7 % — AB (ref 36.0–46.0)
HEMOGLOBIN: 9.4 g/dL — AB (ref 12.0–15.0)
MCH: 29.2 pg (ref 26.0–34.0)
MCHC: 35.2 g/dL (ref 30.0–36.0)
MCV: 82.9 fL (ref 78.0–100.0)
Platelets: 199 10*3/uL (ref 150–400)
RBC: 3.22 MIL/uL — AB (ref 3.87–5.11)
RDW: 12.6 % (ref 11.5–15.5)
WBC: 11.1 10*3/uL — AB (ref 4.0–10.5)

## 2015-10-05 LAB — BASIC METABOLIC PANEL
ANION GAP: 7 (ref 5–15)
BUN: 12 mg/dL (ref 6–20)
CALCIUM: 9.2 mg/dL (ref 8.9–10.3)
CHLORIDE: 97 mmol/L — AB (ref 101–111)
CO2: 26 mmol/L (ref 22–32)
Creatinine, Ser: 0.8 mg/dL (ref 0.44–1.00)
GFR calc non Af Amer: >60 mL/min (ref >60)
GLUCOSE: 129 mg/dL — AB (ref 65–99)
POTASSIUM: 4.1 mmol/L (ref 3.5–5.1)
Sodium: 130 mmol/L — ABNORMAL LOW (ref 135–145)

## 2015-10-05 MED ORDER — ONDANSETRON HCL 4 MG PO TABS: 4.0000 mg | ORAL_TABLET | Freq: Four times a day (QID) | ORAL | Status: DC | PRN

## 2015-10-05 NOTE — Progress Notes (Signed)
Physical Therapy Treatment Note    10/05/15 1500  PT Visit Information  Last PT Received On 10/05/15  Assistance Needed +1  History of Present Illness Pt is a 69 year old female s/p R TKA. Pt with PMH of HTN, depression  PT Time Calculation  PT Start Time (ACUTE ONLY) 1322  PT Stop Time (ACUTE ONLY) 1342  PT Time Calculation (min) (ACUTE ONLY) 20 min  Subjective Data  Subjective Pt assisted to bathroom and then ambulated in hallway.  Pt still slowly mobilizing however much better this afternoon compared to previous sessions.  Pt did not feel she needed to practice steps however verbally reviewed safe technique.  Pt and family had no further questions and pt feels ready to d/c home today.  Precautions  Precautions Knee  Required Braces or Orthoses Knee Immobilizer - Right  Knee Immobilizer - Right Discontinue once straight leg raise with < 10 degree lag  Restrictions  Other Position/Activity Restrictions WBAT  Pain Assessment  Pain Assessment 0-10  Pain Score 4  Pain Location R knee  Pain Descriptors / Indicators Aching;Sore  Pain Intervention(s) Limited activity within patient's tolerance;Monitored during session;Repositioned  Cognition  Arousal/Alertness Awake/alert  Behavior During Therapy WFL for tasks assessed/performed  Overall Cognitive Status Within Functional Limits for tasks assessed  Bed Mobility  General bed mobility comments pt up in room with RN heading to bathroom  Transfers  Overall transfer level Needs assistance  Equipment used Rolling walker (2 wheeled)  Transfers Sit to/from Stand  Sit to Stand Supervision  General transfer comment verbal cues for safe technique  Ambulation/Gait  Ambulation/Gait assistance Min guard;Supervision  Ambulation Distance (Feet) 50 Feet  Assistive device Rolling walker (2 wheeled)  Gait Pattern/deviations Step-to pattern;Antalgic  General Gait Details verbal cues for RW positioning, posture, step length  PT - End of Session   Equipment Utilized During Treatment Right knee immobilizer  Activity Tolerance Patient limited by fatigue  Patient left with call bell/phone within reach;with family/visitor present;in chair  PT - Assessment/Plan  PT Plan Current plan remains appropriate  PT Frequency (ACUTE ONLY) 7X/week  Follow Up Recommendations Home health PT;Supervision - Intermittent  PT equipment Rolling walker with 5" wheels  PT Goal Progression  Progress towards PT goals Progressing toward goals  PT General Charges  $$ ACUTE PT VISIT 1 Procedure  PT Treatments  $Gait Training 8-22 mins   Carmelia Bake, PT, DPT 10/05/2015 Pager: [phone]

## 2015-10-05 NOTE — Progress Notes (Signed)
Occupational Therapy Treatment Patient Details Name: Michele Meyer MRN: [id number] DOB: [birth year] Today's Date: 10/05/2015    History of present illness Pt is a 69 year old female s/p R TKA. Pt with PMH of HTN, depression   OT comments  Pt doing better compared to at eval yesterday. Alittle groggy and slow with commands at times but able to tolerate more activity today. Educated on safety with self care routine and importance of KI until able to SLR. Daughter present for session.   Follow Up Recommendations  No OT follow up;Supervision/Assistance - 24 hour    Equipment Recommendations  3 in 1 bedside comode;Other (comment) (RW)    Recommendations for Other Services      Precautions / Restrictions Precautions Precautions: Knee Required Braces or Orthoses: Knee Immobilizer - Right Knee Immobilizer - Right: Discontinue once straight leg raise with < 10 degree lag Restrictions Weight Bearing Restrictions: No Other Position/Activity Restrictions: WBAT       Mobility Bed Mobility               General bed mobility comments: pt in chair.  Transfers Overall transfer level: Needs assistance Equipment used: Rolling walker (2 wheeled) Transfers: Sit to/from Stand Sit to Stand: Min guard         General transfer comment: cues for hand placement and LE management.     Balance                                   ADL                                         General ADL Comments: Session focused on family education with LB dressing. Another daughter present that will be assisting pt at home. Husband also available to assist but not present for session. Emphasized importance of wearing KI when up including to pull up pants. Educated on option to don underwear and pants at the same time and then don KI on top of pants and then stand to pull up clothing. Pt still groggy and slow with commands at times. Advised pt to wait on showering as pt may  get too fatigued with shower routine as she was fatigued just with sponge bathing. Pt agreeable to sponge bathing initially and daughter aware also. Advised pt she can let HH assess shower transfer once pt is more ready to do shower. Issued shower transfer handout and did review and demo technique however. Pt declined need to use 3in1 and states she needed to rest.      Vision                     Perception     Praxis      Cognition   Behavior During Therapy: [facility] for tasks assessed/performed Overall Cognitive Status: Impaired/Different from baseline Area of Impairment: Problem solving              Problem Solving: Slow processing;Requires verbal cues      Extremity/Trunk Assessment               Exercises     Shoulder Instructions       General Comments      Pertinent Vitals/ Pain       Pain Assessment: 0-10 Pain Score: 6  Pain Location:  R knee Pain Descriptors / Indicators: Aching Pain Intervention(s): Repositioned;Ice applied  Home Living                                          Prior Functioning/Environment              Frequency Min 2X/week     Progress Toward Goals  OT Goals(current goals can now be found in the care plan section)  Progress towards OT goals: Progressing toward goals     Plan Discharge plan remains appropriate    Co-evaluation                 End of Session Equipment Utilized During Treatment: Rolling walker;Right knee immobilizer CPM Right Knee CPM Right Knee: Off   Activity Tolerance Patient limited by fatigue (alittle fatigued after bath)   Patient Left in chair;with call bell/phone within reach;with family/visitor present   Nurse Communication          Time: 3343-5686 OT Time Calculation (min): 28 min  Charges: OT General Charges $OT Visit: 1 Procedure OT Treatments $Self Care/Home Management : 8-22 mins $Therapeutic Activity: 8-22 mins  Jules Schick   [phone] 10/05/2015, 10:49 AM

## 2015-10-05 NOTE — Progress Notes (Signed)
Pt to d/c home with [facility] home [facility]. DME (3n1 & RW) delivered to room prior to d/c. AVS reviewed and "My Chart" discussed with pt. Pt capable of verbalizing medications, dressing changes, signs and symptoms of infection, and follow-up appointments. Prescriptions given to patient. Remains hemodynamically stable. No signs and symptoms of distress. Educated pt to return to ER in the case of SOB, dizziness, or chest pain.

## 2015-10-05 NOTE — Care Management Important Message (Signed)
Important Message  Patient Details  Name: Michele Meyer MRN: [id number] Date of Birth: January 24, 1946   Medicare Important Message Given:  [facility] notification given    Camillo Flaming 10/05/2015, 11:41 AMImportant Message  Patient Details  Name: Michele Meyer MRN: [id number] Date of Birth: [birth year]   Medicare Important Message Given:  Yes-second notification given    Camillo Flaming 10/05/2015, 11:41 AM

## 2015-10-05 NOTE — Progress Notes (Signed)
   Subjective: 2 Days Post-Op Procedure(s) (LRB): RIGHT TOTAL KNEE ARTHROPLASTY (Right) Patient reports pain as mild and moderate.  She had some nausea yesterday so will add a Zofran RX for home. Patient seen in rounds with Dr. Wynelle Link. Patient is well, but has had some minor complaints of pain in the knee, requiring pain medications Patient is ready to go home after therapy.  Objective: Vital signs in last 24 hours: Temp:  [97 F (36.1 C)-98.3 F (36.8 C)] 98 F (36.7 C) (10/12 0408) Pulse Rate:  [55-88] 65 (10/12 0408) Resp:  [16-18] 16 (10/11 1433) BP: (99-158)/(41-55) 149/55 mmHg (10/12 0408) SpO2:  [98 %-100 %] 98 % (10/12 0408)  Intake/Output from previous day:  Intake/Output Summary (Last 24 hours) at 10/05/15 0707 Last data filed at 10/05/15 0500  Gross per 24 hour  Intake 1698.75 ml  Output   2850 ml  Net -1151.25 ml    Intake/Output this shift:    Labs:  Recent Labs  10/04/15 0517 10/05/15 0432  HGB 10.0* 9.4*    Recent Labs  10/04/15 0517 10/05/15 0432  WBC 12.4* 11.1*  RBC 3.44* 3.22*  HCT 28.5* 26.7*  PLT 196 199    Recent Labs  10/04/15 0517 10/05/15 0432  NA 127* 130*  K 4.4 4.1  CL 98* 97*  CO2 24 26  BUN 11 12  CREATININE 0.77 0.80  GLUCOSE 180* 129*  CALCIUM 8.8* 9.2   No results for input(s): LABPT, INR in the last 72 hours.  EXAM: General - Patient is Alert and Appropriate Extremity - Neurovascular intact Sensation intact distally Incision - clean, dry, no drainage Motor Function - intact, moving foot and toes well on exam.   Assessment/Plan: 2 Days Post-Op Procedure(s) (LRB): RIGHT TOTAL KNEE ARTHROPLASTY (Right) Procedure(s) (LRB): RIGHT TOTAL KNEE ARTHROPLASTY (Right) Past Medical History  Diagnosis Date  . Anxiety   . Cystocele   . Incomplete bladder emptying   . Chronic cystitis   . Stress incontinence   . Depression   . UTI (lower urinary tract infection)   . Skin cancer   . Arthritis   . Endometriosis    . Glaucoma     both eyes  . Uterovaginal prolapse, incomplete   . Skin cancer     basal and squamous cell  . Labile hypertension   . Dyslipidemia   . Cyst of right kidney   . Diverticulosis   . Gestational diabetes mellitus 30 years ago    with pregnancy   . Bladder filling defect     bladder sling protrusing last 3 years   . PONV (postoperative nausea and vomiting)    Principal Problem:   OA (osteoarthritis) of knee  Estimated body mass index is 27.43 kg/(m^2) as calculated from the following:   Height as of this encounter: 5\' 2"  (1.575 m).   Weight as of this encounter: 68.04 kg (150 lb). Up with therapy Discharge home with home health Diet - Cardiac diet Follow up - in 2 weeks Activity - WBAT Disposition - Home Condition Upon Discharge - Good D/C Meds - See DC Summary DVT Prophylaxis - Xarelto  Arlee Muslim, PA-C Orthopaedic Surgery 10/05/2015, 7:07 AM

## 2015-10-05 NOTE — Care Management Note (Signed)
Case Management Note  Patient Details  Name: APPLE DEARMAS MRN: [id number] Date of Birth: 12-18-1946  Subjective/Objective:                   RIGHT TOTAL KNEE ARTHROPLASTY (Right) Action/Plan: Discharge planning  Expected Discharge Date:  10/05/15               Expected Discharge Plan:  [place]  In-House Referral:     Discharge planning Services  CM Consult  Post Acute Care Choice:  Home Health Choice offered to:  Patient  DME Arranged:  3-N-1, Walker rolling DME Agency:  [place]:  PT [place] Agency:  [place]  Status of Service:  Completed, signed off  Medicare Important Message Given:    Date Medicare IM Given:    Medicare IM give by:    Date Additional Medicare IM Given:    Additional Medicare Important Message give by:     If discussed at [place] of Stay Meetings, dates discussed:    Additional Comments: CM met with pt in room to offer choice of home health agency.  Pt chooses [facility] to render HHPT.  Address and contact information verified by pt. Referral emailed to [facility] rep, Tim.  CM called [facility] DME rep Lecretia to please deliver the 3n1 and rolling walker to room prior to discharge. Dellie Catholic, RN 10/05/2015, 8:56 AM

## 2015-10-05 NOTE — Progress Notes (Signed)
Physical Therapy Treatment Patient Details Name: Michele Meyer MRN: [id number] DOB: 04-26-1946 Today's Date: 10/05/2015    History of Present Illness Pt is a 69 year old female s/p R TKA. Pt with PMH of HTN, depression    PT Comments    Pt moving slowly however able to tolerate improved distance and practice steps this morning.  Spouse present for session and plans to assist at home.  Follow Up Recommendations  Home health PT;Supervision - Intermittent     Equipment Recommendations  Rolling walker with 5" wheels    Recommendations for Other Services       Precautions / Restrictions Precautions Precautions: Knee Required Braces or Orthoses: Knee Immobilizer - Right Knee Immobilizer - Right: Discontinue once straight leg raise with < 10 degree lag Restrictions Weight Bearing Restrictions: No Other Position/Activity Restrictions: WBAT    Mobility  Bed Mobility               General bed mobility comments: pt up in recliner  Transfers Overall transfer level: Needs assistance Equipment used: Rolling walker (2 wheeled) Transfers: Sit to/from Stand Sit to Stand: Min guard         General transfer comment: verbal cues for safe technique  Ambulation/Gait Ambulation/Gait assistance: Min guard Ambulation Distance (Feet): 40 Feet (x2) Assistive device: Rolling walker (2 wheeled) Gait Pattern/deviations: Step-to pattern;Antalgic     General Gait Details: verbal cues for sequence, RW positioning, posture, step length   Stairs Stairs: Yes Stairs assistance: Min guard Stair Management: Step to pattern;Forwards;Two rails;One rail Left Number of Stairs: 3 General stair comments: pt has rail on L and wall on right at home, performed ascending with 2 rails and descending semisideways with one rail, verbal cues for sequence and safety, spouse present and assisted  Wheelchair Mobility    Modified Rankin (Stroke Patients Only)       Balance                                     Cognition Arousal/Alertness: Awake/alert Behavior During Therapy: WFL for tasks assessed/performed Overall Cognitive Status: Impaired/Different from baseline Area of Impairment: Problem solving             Problem Solving: Slow processing;Requires verbal cues General Comments: improved since yesterday    Exercises Total Joint Exercises Ankle Circles/Pumps: AROM;Both;10 reps Quad Sets: AROM;Right;10 reps Short Arc QuadSinclair Ship;Right;10 reps Heel Slides: AAROM;Right;10 reps Hip ABduction/ADduction: AROM;Right;10 reps Straight Leg Raises: Right;10 reps;AAROM    General Comments        Pertinent Vitals/Pain Pain Assessment: 0-10 Pain Score: 5  Pain Location: R knee Pain Descriptors / Indicators: Aching;Sore Pain Intervention(s): Limited activity within patient's tolerance;Monitored during session;Repositioned;Ice applied    Home Living                      Prior Function            PT Goals (current goals can now be found in the care plan section) Progress towards PT goals: Progressing toward goals    Frequency  7X/week    PT Plan Current plan remains appropriate    Co-evaluation             End of Session Equipment Utilized During Treatment: Right knee immobilizer;Gait belt Activity Tolerance: Patient limited by fatigue Patient left: with call bell/phone within reach;with family/visitor present;in chair     Time: 5465-6812 PT  Time Calculation (min) (ACUTE ONLY): 31 min  Charges:  $Gait Training: 8-22 mins $Therapeutic Exercise: 8-22 mins                    G Codes:      Keslee Harrington,KATHrine E 25-Oct-2015, 12:11 PM Carmelia Bake, PT, DPT 2015/10/25 Pager: [phone]

## 2015-10-10 NOTE — Telephone Encounter (Signed)
PT NOT BILLED COLON CX FEE

## 2015-10-13 ENCOUNTER — Encounter: Payer: Self-pay | Admitting: Family Medicine

## 2015-10-13 ENCOUNTER — Ambulatory Visit (INDEPENDENT_AMBULATORY_CARE_PROVIDER_SITE_OTHER): Payer: Medicare Other | Admitting: Family Medicine

## 2015-10-13 ENCOUNTER — Other Ambulatory Visit: Payer: Self-pay | Admitting: Family Medicine

## 2015-10-13 VITALS — BP 134/76 | HR 92 | Temp 99.3°F

## 2015-10-13 DIAGNOSIS — N3001 Acute cystitis with hematuria: Secondary | ICD-10-CM

## 2015-10-13 DIAGNOSIS — R300 Dysuria: Secondary | ICD-10-CM

## 2015-10-13 LAB — UA/M W/RFLX CULTURE, ROUTINE
Bilirubin, UA: NEGATIVE
GLUCOSE, UA: NEGATIVE
KETONES UA: NEGATIVE
Leukocytes, UA: NEGATIVE
NITRITE UA: NEGATIVE
Protein, UA: NEGATIVE
UUROB: 4 mg/dL — AB (ref 0.2–1.0)
pH, UA: 6.5 (ref 5.0–7.5)

## 2015-10-13 LAB — MICROSCOPIC EXAMINATION
CAST TYPE: NONE SEEN
CRYSTAL TYPE: NONE SEEN
CRYSTALS: NONE SEEN
Casts: NONE SEEN /lpf
MUCUS UA: NONE SEEN
RBC, UA: NONE SEEN /hpf (ref 0–?)
Renal Epithel, UA: NONE SEEN /hpf
Trichomonas, UA: NONE SEEN
YEAST UA: NONE SEEN

## 2015-10-13 MED ORDER — CIPROFLOXACIN HCL 500 MG PO TABS: 500.0000 mg | ORAL_TABLET | Freq: Two times a day (BID) | ORAL | Status: DC

## 2015-10-13 NOTE — Progress Notes (Signed)
BP 134/76 mmHg  Pulse 92  Temp(Src) 99.3 F (37.4 C)  SpO2 100%   Subjective:    Patient ID: Michele Meyer, female    DOB: [birth year], 69 y.o.   MRN: [id number]  HPI: Michele Meyer is a 69 y.o. female  Chief Complaint  Patient presents with  . Urinary Tract Infection    Patient had surgery 9 days, she had a catherter    Surgery went well. Has been having a fever since having the surgery. Has had low grade right around 100 for the past 9 days, did have 1 temp of 101 earlier on post-op  URINARY SYMPTOMS Duration: 9 days Dysuria: no Urinary frequency: yes Urgency: yes Small volume voids: no Symptom severity: mild Urinary incontinence: yes Foul odor: no Hematuria: no Abdominal pain: no Back pain: yes- post-op Suprapubic pain/pressure: yes Flank pain: no Fever:  yes and low grade Vomiting: yes Treatments attempted: increasing fluids  Relevant past medical, surgical, family and social history reviewed and updated as indicated. Interim medical history since our last visit reviewed. Allergies and medications reviewed and updated.  Review of Systems  Constitutional: Negative.   Respiratory: Negative.   Cardiovascular: Negative.   Genitourinary: Positive for urgency and frequency. Negative for dysuria, hematuria, flank pain, decreased urine volume, vaginal bleeding, vaginal discharge, enuresis, difficulty urinating, genital sores, vaginal pain, menstrual problem, pelvic pain and dyspareunia.  Psychiatric/Behavioral: Negative.     Per HPI unless specifically indicated above     Objective:    BP 134/76 mmHg  Pulse 92  Temp(Src) 99.3 F (37.4 C)  SpO2 100%  Wt Readings from Last 3 Encounters:  10/03/15 150 lb (68.04 kg)  09/28/15 150 lb 12.8 oz (68.402 kg)  09/16/15 150 lb (68.04 kg)    Physical Exam  Constitutional: She is oriented to person, place, and time. She appears well-developed and well-nourished. No distress.  HENT:  Head: Normocephalic and atraumatic.   Right Ear: Hearing normal.  Left Ear: Hearing normal.  Nose: Nose normal.  Eyes: Conjunctivae and lids are normal. Right eye exhibits no discharge. Left eye exhibits no discharge. No scleral icterus.  Cardiovascular: Normal rate, regular rhythm, normal heart sounds and intact distal pulses.  Exam reveals no gallop and no friction rub.   No murmur heard. Pulmonary/Chest: Effort normal. No respiratory distress. She has no wheezes. She has no rales. She exhibits no tenderness.  Abdominal: Soft. Bowel sounds are normal. She exhibits no distension and no mass. There is no tenderness. There is no rebound and no guarding.  Musculoskeletal:  Wound on R knee healing well small amount of erythema at top of incision- no sign of infection, no heat, significant bruising and swelling of R knee, edema in R ankle  Neurological: She is alert and oriented to person, place, and time.  Skin: Skin is warm, dry and intact. No rash noted. No erythema. No pallor.  Psychiatric: She has a normal mood and affect. Her speech is normal and behavior is normal. Judgment and thought content normal. Cognition and memory are normal.    Results for orders placed or performed during the hospital encounter of 10/03/15  CBC  Result Value Ref Range   WBC 12.4 (H) 4.0 - 10.5 K/uL   RBC 3.44 (L) 3.87 - 5.11 MIL/uL   Hemoglobin 10.0 (L) 12.0 - 15.0 g/dL   HCT 28.5 (L) 36.0 - 46.0 %   MCV 82.8 78.0 - 100.0 fL   MCH 29.1 26.0 - 34.0 pg   MCHC  35.1 30.0 - 36.0 g/dL   RDW 12.2 11.5 - 15.5 %   Platelets 196 150 - 400 K/uL  Basic metabolic panel  Result Value Ref Range   Sodium 127 (L) 135 - 145 mmol/L   Potassium 4.4 3.5 - 5.1 mmol/L   Chloride 98 (L) 101 - 111 mmol/L   CO2 24 22 - 32 mmol/L   Glucose, Bld 180 (H) 65 - 99 mg/dL   BUN 11 6 - 20 mg/dL   Creatinine, Ser 0.77 0.44 - 1.00 mg/dL   Calcium 8.8 (L) 8.9 - 10.3 mg/dL   GFR calc non Af Amer >60 >60 mL/min   GFR calc Af Amer >60 >60 mL/min   Anion gap 5 5 - 15  CBC   Result Value Ref Range   WBC 11.1 (H) 4.0 - 10.5 K/uL   RBC 3.22 (L) 3.87 - 5.11 MIL/uL   Hemoglobin 9.4 (L) 12.0 - 15.0 g/dL   HCT 26.7 (L) 36.0 - 46.0 %   MCV 82.9 78.0 - 100.0 fL   MCH 29.2 26.0 - 34.0 pg   MCHC 35.2 30.0 - 36.0 g/dL   RDW 12.6 11.5 - 15.5 %   Platelets 199 150 - 400 K/uL  Basic metabolic panel  Result Value Ref Range   Sodium 130 (L) 135 - 145 mmol/L   Potassium 4.1 3.5 - 5.1 mmol/L   Chloride 97 (L) 101 - 111 mmol/L   CO2 26 22 - 32 mmol/L   Glucose, Bld 129 (H) 65 - 99 mg/dL   BUN 12 6 - 20 mg/dL   Creatinine, Ser 0.80 0.44 - 1.00 mg/dL   Calcium 9.2 8.9 - 10.3 mg/dL   GFR calc non Af Amer >60 >60 mL/min   GFR calc Af Amer >60 >60 mL/min   Anion gap 7 5 - 15  Glucose, capillary  Result Value Ref Range   Glucose-Capillary 97 65 - 99 mg/dL      Assessment & Plan:   Problem List Items Addressed This Visit    None    Visit Diagnoses    Acute cystitis with hematuria    -  Primary    Likely due to catheter. Will treat with cipro. Call if not getting better or getting worse.     Dysuria        Will check UA today.         Follow up plan: Return in about 4 months (around 02/13/2016) for BP follow up.

## 2015-10-26 ENCOUNTER — Ambulatory Visit: Payer: Medicare Other | Attending: Orthopedic Surgery

## 2015-10-26 DIAGNOSIS — Z4789 Encounter for other orthopedic aftercare: Secondary | ICD-10-CM | POA: Diagnosis present

## 2015-10-26 DIAGNOSIS — R262 Difficulty in walking, not elsewhere classified: Secondary | ICD-10-CM

## 2015-10-26 DIAGNOSIS — R531 Weakness: Secondary | ICD-10-CM | POA: Diagnosis present

## 2015-10-26 NOTE — Therapy (Signed)
[facility] [address] [place], [address] Phone: [phone]   Fax:  [phone]  Physical Therapy Evaluation  Patient Details  Name: Michele Meyer MRN: [id number] Date of Birth: 02-Jan-1946 Referring Provider: Gaynelle Arabian, MD  Encounter Date: 10/26/2015      PT End of Session - 10/26/15 1422    Visit Number 1   Number of Visits 13   Date for PT Re-Evaluation 12/08/15   Authorization Type 1   Authorization Time Period of 10   PT Start Time 1423   PT Stop Time 1531   PT Time Calculation (min) 68 min   Equipment Utilized During Treatment Gait belt  rw   Activity Tolerance Patient tolerated treatment well   Behavior During Therapy [facility] for tasks assessed/performed      Past Medical History  Diagnosis Date  . Anxiety   . Cystocele   . Incomplete bladder emptying   . Chronic cystitis   . Stress incontinence   . Depression   . UTI (lower urinary tract infection)   . Skin cancer   . Arthritis   . Endometriosis   . Glaucoma     both eyes  . Uterovaginal prolapse, incomplete   . Skin cancer     basal and squamous cell  . Labile hypertension   . Dyslipidemia   . Cyst of right kidney   . Diverticulosis   . Gestational diabetes mellitus 30 years ago    with pregnancy   . Bladder filling defect     bladder sling protrusing last 3 years   . PONV (postoperative nausea and vomiting)     Past Surgical History  Procedure Laterality Date  . Removal of first rib      bilaterally  . Prolapsed bladder      Repair Dr.Cope  . Vein ligation and stripping Bilateral   . Breast biopsy    . Tonsillectomy    . Appendectomy    . Breast lumpectomy      benign  . Pubovaginal sling  4 years ago    protrusion of bladder sling for last 3 years  . Anterior and posterior vaginal repair    . Abdominal hysterectomy      complete  . Total knee arthroplasty Right 10/03/2015    Procedure: RIGHT TOTAL KNEE  ARTHROPLASTY;  Surgeon: Gaynelle Arabian, MD;  Location: [facility];  Service: Orthopedics;  Laterality: Right;    There were no vitals filed for this visit.  Visit Diagnosis:  Orthopedic aftercare - Plan: PT plan of care cert/re-cert  Weakness - Plan: PT plan of care cert/re-cert  Difficulty walking - Plan: PT plan of care cert/re-cert      Subjective Assessment - 10/26/15 1431    Subjective R knee pain currently: 0/10 (patient took pain medication), R knee pain 8/10 at worst (at night)   Pertinent History S/P R TKA 10/03/15 secondary to knee pain from arthritis. Prior to surgery, pt had a lot of difficulty walking and negotiating stairs due to her pain. Did not use AD but had difficulty. Knee feels better after surgery even though has pain. Participated in home health PT for 2 weeks which involved  knee flexion, extension exercises, ankle PF/DF  and walking.  Pt also adds that she picked up her 71 month old grand child (15-16 lbs) which caused increased swelling 10/20/15 . MD checked her knee out and stated that pt broke up some sutures inside her  knee but no ligaments or tendons were injured.    Patient Stated Goals "I want to get rid of this walker and cane. I just want to be able to walk and keep up with my family, feel good, and be strong (core strength).    Currently in Pain? Yes   Pain Score 0-No pain   Pain Location Knee   Pain Orientation Right   Multiple Pain Sites No     Objectives  Manual therapy:  Soft tissue mobilization around scar tissue to promote knee flexion mobility Soft tissue mobility to distal medial and lateral hamstrings with leg propped on chair in extension to promote knee extension AROM.   There-ex: R knee extension stretch with leg propped on a chair x 2 min. Given and reviewed as part of her HEP. Pt verbalized understanding.  Quad set 10x5 seconds  Gait with rw with emphasis on R knee extension during stance phase 32 ft x 2,  Then with emphasis on R knee  flexion during swing phase and knee extension during stance phase and ankle DF during heel strike 32 ft x 2  Improved exercise technique, movement at target joints, use of target muscles after mod verbal, visual, tactile cues.          [facility] PT Assessment - 10/26/15 1416    Assessment   Medical Diagnosis S/P R TKA   Referring Provider Gaynelle Arabian, MD   Onset Date/Surgical Date 10/03/15   Prior Therapy Home health PT for 2 weeks   Precautions   Precaution Comments no known precautions   Restrictions   Other Position/Activity Restrictions No known restrictions   Balance Screen   Has the patient fallen in the past 6 months No   Has the patient had a decrease in activity level because of a fear of falling?  No  Pt states fear of falling   Is the patient reluctant to leave their home because of a fear of falling?  No  Pt states fear of falling   Home Environment   Additional Comments Pt lives in a 1 story home, 6 steps to enter, bilateral rail   Prior Function   Level of Independence Independent  but with increased knee pain   Vocation Requirements PLOF: difficulty walking, negotiating stairs.    Observation/Other Assessments   Observations R knee swelling. Scab inferior incision. Surgical incision healing satisfactorily.    Lower Extremity Functional Scale  14/80   Posture/Postural Control   Posture Comments L weight shift L genu varus, slight decrease in R knee extension   AROM   Right Knee Extension -8   Right Knee Flexion 98   Left Knee Extension -7   Left Knee Flexion 115   Strength   Right Knee Flexion 4/5  gentle pressure   Right Knee Extension 4+/5  Gentle pressure   Palpation   Palpation comment slight warmth R knee, swelling. Decreased scar tissue mobility.    Ambulation/Gait   Gait Comments ambulates with rw, forward flexed, decreased bilateral knee extension during stance phase, decreased bilateral hip extension                            PT Education - 10/26/15 1830    Education provided Yes   Education Details ther-ex, HEP, plan of care   Person(s) Educated Patient;Spouse   Methods Explanation;Demonstration;Tactile cues;Verbal cues;Handout   Comprehension Verbalized understanding;Returned demonstration  PT Long Term Goals - 11/12/2015 1757    PT LONG TERM GOAL #1   Title Patient will improve R knee extension AROM to 0 degrees to improve stance phase of gait   Time 6   Period Weeks   Status New   PT LONG TERM GOAL #2   Title Patient will improve R knee flexion AROM to at least 115 degrees to promote ability to negotiate stairs to enter her home.    Time 6   Period Weeks   Status New   PT LONG TERM GOAL #3   Title Patient will be able to ambulate with SPC modified independent for at least 500 ft to promote mobility   Time 6   Period Weeks   Status New   PT LONG TERM GOAL #4   Title Patient will improve her LEFS score by at least 9 points as a demonstration of improved function.    Baseline 14/80   Time 6   Period Weeks   Status New               Plan - 11/12/2015 1745    Clinical Impression Statement Patient is a 69 year old female who came to physical therapy S/P R TKA on 10/03/15. She also presents with swelling, pain, decreased scar tissue mobility, decreased R knee flexion and extension AROM, weakness, altered gait pattern and posture, and difficulty performing functional tasks such as getting up from a chair, walking, and stair negotiation. Patient will benefit from skilled physical therapy services to address the aforementioned deficits.    Pt will benefit from skilled therapeutic intervention in order to improve on the following deficits Pain;Decreased strength;Decreased scar mobility;Difficulty walking;Decreased range of motion;Abnormal gait   Rehab Potential Good   Clinical Impairments Affecting Rehab Potential No known clinical impairments affecting rehab potential.   PT Frequency  2x / week   PT Duration 6 weeks   PT Treatment/Interventions Therapeutic exercise;Manual techniques;Therapeutic activities;Electrical Stimulation;Gait training;Patient/family education;Neuromuscular re-education   PT Next Visit Plan soft tissue mobilization, knee flexion and extension ROM, gait, decrease swelling   Consulted and Agree with Plan of Care Patient          G-Codes - November 12, 2015 1802    Functional Assessment Tool Used LEFS, patient interview, clinical presentation   Functional Limitation Mobility: Walking and moving around   Mobility: Walking and Moving Around Current Status [phone]) At least 60 percent but less than 80 percent impaired, limited or restricted   Mobility: Walking and Moving Around Goal Status [phone]) At least 20 percent but less than 40 percent impaired, limited or restricted       Problem List Patient Active Problem List   Diagnosis Date Noted  . OA (osteoarthritis) of knee 10/03/2015  . ETD (eustachian tube dysfunction) 09/16/2015  . Arthritis 06/30/2015  . H/O gastrointestinal disease 06/30/2015  . Depression, major, recurrent, moderate ([place]) 06/30/2015  . Aortic atherosclerosis ([place]) 02/08/2015  . Toenail deformity 02/08/2015  . Hyponatremia 01/04/2015  . Abdominal pain, right upper quadrant 11/24/2014  . Kidney cysts 05/25/2014  . Medicare annual wellness visit, subsequent 05/10/2014  . Screening for skin cancer 05/10/2014  . Tremor 05/10/2014  . GERD (gastroesophageal reflux disease) 02/05/2014  . Insomnia 01/01/2014  . Hot flashes 01/01/2014  . Glaucoma 03/12/2013  . Back pain 02/25/2013  . Depression 01/29/2013  . Allergic to latex 10/28/2012  . Bladder infection, chronic 10/28/2012  . Cystocele, midline 10/28/2012  . Female genuine stress incontinence 10/28/2012  . Incomplete bladder  emptying 10/28/2012  . LBP (low back pain) 10/28/2012  . Neuralgia neuritis, sciatic nerve 10/28/2012  . Incomplete uterine prolapse 10/28/2012  . Urge  incontinence 10/28/2012  . Screening for breast cancer 10/02/2012  . Hypertension 10/02/2012  . Osteoarthritis 01/10/2012  . Hyperlipidemia 10/24/2011  . Generalized anxiety disorder 10/24/2011   Thank you for your referral.  Joneen Boers PT, DPT   10/26/2015, 6:39 PM  [facility] [address], [place], [address] Phone: [phone]   Fax:  [phone]  Name: BENNETTA RUDDEN MRN: [id number] Date of Birth: 09-29-1946

## 2015-10-26 NOTE — Patient Instructions (Signed)
   Sitting on a chair, prop your right leg up onto another chair to feel a comfortable stretch behind your knee. Hold stretch for 2 min. Repeat 6 times daily.

## 2015-10-31 ENCOUNTER — Ambulatory Visit: Payer: Medicare Other

## 2015-10-31 DIAGNOSIS — Z4789 Encounter for other orthopedic aftercare: Secondary | ICD-10-CM | POA: Diagnosis not present

## 2015-10-31 DIAGNOSIS — R262 Difficulty in walking, not elsewhere classified: Secondary | ICD-10-CM

## 2015-10-31 DIAGNOSIS — R531 Weakness: Secondary | ICD-10-CM

## 2015-10-31 NOTE — Patient Instructions (Signed)
   On your back with your entire right leg propped in a pillow or two, relax your leg to feel a comfortable stretch behind your right knee. Hold for 5 min. Do 2 sets. Repeat for a total of 60 minutes comfortable daily.    Sitting on a chair, and keeping your knee in the neutral position, slide your R heel back. You can move forward on the chair to get more of a knee bend stretch. Hold for 5 seconds. You can use your arms and L foot to help you get back to the starting position. Repeat 10 times. Do 3 sets daily.

## 2015-10-31 NOTE — Therapy (Signed)
[facility] [address], [place], [address] Phone: [phone]   Fax:  [phone]  Physical Therapy Treatment  Patient Details  Name: Michele Meyer MRN: [id number] Date of Birth: 08-30-1946 Referring Provider: Gaynelle Arabian, MD  Encounter Date: 10/31/2015      PT End of Session - 10/31/15 1259    Visit Number 2   Number of Visits 13   Date for PT Re-Evaluation 12/08/15   Authorization Type 2   Authorization Time Period of 10   PT Start Time 1300   PT Stop Time 1353   PT Time Calculation (min) 53 min   Equipment Utilized During Treatment Gait belt  rw   Activity Tolerance Patient tolerated treatment well   Behavior During Therapy [facility] for tasks assessed/performed      Past Medical History  Diagnosis Date  . Anxiety   . Cystocele   . Incomplete bladder emptying   . Chronic cystitis   . Stress incontinence   . Depression   . UTI (lower urinary tract infection)   . Skin cancer   . Arthritis   . Endometriosis   . Glaucoma     both eyes  . Uterovaginal prolapse, incomplete   . Skin cancer     basal and squamous cell  . Labile hypertension   . Dyslipidemia   . Cyst of right kidney   . Diverticulosis   . Gestational diabetes mellitus 30 years ago    with pregnancy   . Bladder filling defect     bladder sling protrusing last 3 years   . PONV (postoperative nausea and vomiting)     Past Surgical History  Procedure Laterality Date  . Removal of first rib      bilaterally  . Prolapsed bladder      Repair Dr.Cope  . Vein ligation and stripping Bilateral   . Breast biopsy    . Tonsillectomy    . Appendectomy    . Breast lumpectomy      benign  . Pubovaginal sling  4 years ago    protrusion of bladder sling for last 3 years  . Anterior and posterior vaginal repair    . Abdominal hysterectomy      complete  . Total knee arthroplasty Right 10/03/2015    Procedure: RIGHT TOTAL KNEE  ARTHROPLASTY;  Surgeon: Gaynelle Arabian, MD;  Location: [facility];  Service: Orthopedics;  Laterality: Right;    There were no vitals filed for this visit.  Visit Diagnosis:  Orthopedic aftercare  Weakness  Difficulty walking      Subjective Assessment - 10/31/15 1256    Subjective Pt states that she's been walking without walker at home but uses furniture. Pt also states standing for greater than 10 min increases R ankle discomfort at ankle joint.    Pertinent History S/P R TKA 10/03/15 secondary to knee pain from arthritis. Prior to surgery, pt had a lot of difficulty walking and negotiating stairs due to her pain. Did not use AD but had difficulty. Knee feels better after surgery even though has pain. Participated in home health PT for 2 weeks which involved  knee flexion, extension exercises, ankle PF/DF  and walking.  Pt also adds that she picked up her 17 month old grand child (15-16 lbs) which caused increased swelling 10/20/15 . MD checked her knee out and stated that pt broke up some sutures inside her knee but no ligaments or tendons were injured.  Patient Stated Goals "I want to get rid of this walker and cane. I just want to be able to walk and keep up with my family, feel good, and be strong (core strength).    Currently in Pain? Yes   Pain Score 5   R knee   Multiple Pain Sites Yes  R knee and R ankle      Objectives:   Manual therapy:  Soft tissue mobilization around scar tissue.  Soft tissue mobilization to distal medial and lateral hamstrings with R leg propped in extension in supine to promote R knee extension ROM.    There-ex  Directed patient with supine quad set 2x10 with 5 second holds,   Standing R gastroc stretch 30 seconds x 3 to promote knee extension ROM  Seated knee flexion stretch 10x 5 seconds   Reviewed HEP (see pt instructions). Pt demonstrated and verbalized understanding.   Answered pt questions pertaining to knee pain and TKA surgery  recovery, as well as with progression towards independent ambulation.    Improved exercise technique, movement at target joints, use of target muscles after mod verbal, visual, tactile cues.     Supine R knee extension (quad set) -9 degrees (-7 degrees without ankle DF). Improved soft tissue mobility around scar tissue after manual therapy.                              PT Education - 10/31/15 1326    Education provided Yes   Education Details ther-ex, HEP   Person(s) Educated Patient   Methods Explanation;Demonstration;Tactile cues;Verbal cues;Handout   Comprehension Verbalized understanding;Returned demonstration             PT Long Term Goals - 10/26/15 1757    PT LONG TERM GOAL #1   Title Patient will improve R knee extension AROM to 0 degrees to improve stance phase of gait   Time 6   Period Weeks   Status New   PT LONG TERM GOAL #2   Title Patient will improve R knee flexion AROM to at least 115 degrees to promote ability to negotiate stairs to enter her home.    Time 6   Period Weeks   Status New   PT LONG TERM GOAL #3   Title Patient will be able to ambulate with SPC modified independent for at least 500 ft to promote mobility   Time 6   Period Weeks   Status New   PT LONG TERM GOAL #4   Title Patient will improve her LEFS score by at least 9 points as a demonstration of improved function.    Baseline 14/80   Time 6   Period Weeks   Status New               Plan - 10/31/15 1256    Clinical Impression Statement Supine R knee extension (quad set) -9 degrees (-7 degrees without ankle DF). Improved soft tissue mobility around scar tissue after manual therapy.    Pt will benefit from skilled therapeutic intervention in order to improve on the following deficits Pain;Decreased strength;Decreased scar mobility;Difficulty walking;Decreased range of motion;Abnormal gait   Rehab Potential Good   Clinical Impairments Affecting Rehab  Potential No known clinical impairments affecting rehab potential.   PT Frequency 2x / week   PT Duration 6 weeks   PT Treatment/Interventions Therapeutic exercise;Manual techniques;Therapeutic activities;Electrical Stimulation;Gait training;Patient/family education;Neuromuscular re-education   PT Next Visit Plan soft tissue  mobilization, knee flexion and extension ROM, gait, decrease swelling   Consulted and Agree with Plan of Care Patient        Problem List Patient Active Problem List   Diagnosis Date Noted  . OA (osteoarthritis) of knee 10/03/2015  . ETD (eustachian tube dysfunction) 09/16/2015  . Arthritis 06/30/2015  . H/O gastrointestinal disease 06/30/2015  . Depression, major, recurrent, moderate ([place]) 06/30/2015  . Aortic atherosclerosis ([place]) 02/08/2015  . Toenail deformity 02/08/2015  . Hyponatremia 01/04/2015  . Abdominal pain, right upper quadrant 11/24/2014  . Kidney cysts 05/25/2014  . Medicare annual wellness visit, subsequent 05/10/2014  . Screening for skin cancer 05/10/2014  . Tremor 05/10/2014  . GERD (gastroesophageal reflux disease) 02/05/2014  . Insomnia 01/01/2014  . Hot flashes 01/01/2014  . Glaucoma 03/12/2013  . Back pain 02/25/2013  . Depression 01/29/2013  . Allergic to latex 10/28/2012  . Bladder infection, chronic 10/28/2012  . Cystocele, midline 10/28/2012  . Female genuine stress incontinence 10/28/2012  . Incomplete bladder emptying 10/28/2012  . LBP (low back pain) 10/28/2012  . Neuralgia neuritis, sciatic nerve 10/28/2012  . Incomplete uterine prolapse 10/28/2012  . Urge incontinence 10/28/2012  . Screening for breast cancer 10/02/2012  . Hypertension 10/02/2012  . Osteoarthritis 01/10/2012  . Hyperlipidemia 10/24/2011  . Generalized anxiety disorder 10/24/2011   Joneen Boers PT, DPT  10/31/2015, 4:40 PM  [facility] [facility] [address] [place], [address] Phone:  [phone]   Fax:  [phone]  Name: Michele Meyer MRN: [id number] Date of Birth: 07/02/1946

## 2015-11-02 ENCOUNTER — Ambulatory Visit: Payer: Medicare Other

## 2015-11-02 DIAGNOSIS — R531 Weakness: Secondary | ICD-10-CM

## 2015-11-02 DIAGNOSIS — Z4789 Encounter for other orthopedic aftercare: Secondary | ICD-10-CM

## 2015-11-02 DIAGNOSIS — R262 Difficulty in walking, not elsewhere classified: Secondary | ICD-10-CM

## 2015-11-02 NOTE — Therapy (Signed)
[place] [facility] [address] [place], [address] Phone: [phone]   Fax:  [phone]  Physical Therapy Treatment  Patient Details  Name: Michele Meyer MRN: [id number] Date of Birth: 11-24-1946 Referring Provider: Gaynelle Arabian, MD  Encounter Date: 11/02/2015      PT End of Session - 11/02/15 1722    Visit Number 3   Number of Visits 13   Date for PT Re-Evaluation 12/08/15   Authorization Type 3   Authorization Time Period of 10   PT Start Time 1722   PT Stop Time 1811   PT Time Calculation (min) 49 min   Equipment Utilized During Treatment Gait belt  rw   Activity Tolerance Patient tolerated treatment well   Behavior During Therapy [facility] for tasks assessed/performed      Past Medical History  Diagnosis Date  . Anxiety   . Cystocele   . Incomplete bladder emptying   . Chronic cystitis   . Stress incontinence   . Depression   . UTI (lower urinary tract infection)   . Skin cancer   . Arthritis   . Endometriosis   . Glaucoma     both eyes  . Uterovaginal prolapse, incomplete   . Skin cancer     basal and squamous cell  . Labile hypertension   . Dyslipidemia   . Cyst of right kidney   . Diverticulosis   . Gestational diabetes mellitus 30 years ago    with pregnancy   . Bladder filling defect     bladder sling protrusing last 3 years   . PONV (postoperative nausea and vomiting)     Past Surgical History  Procedure Laterality Date  . Removal of first rib      bilaterally  . Prolapsed bladder      Repair Dr.Cope  . Vein ligation and stripping Bilateral   . Breast biopsy    . Tonsillectomy    . Appendectomy    . Breast lumpectomy      benign  . Pubovaginal sling  4 years ago    protrusion of bladder sling for last 3 years  . Anterior and posterior vaginal repair    . Abdominal hysterectomy      complete  . Total knee arthroplasty Right 10/03/2015    Procedure: RIGHT TOTAL KNEE  ARTHROPLASTY;  Surgeon: Gaynelle Arabian, MD;  Location: [facility];  Service: Orthopedics;  Laterality: Right;    There were no vitals filed for this visit.  Visit Diagnosis:  Orthopedic aftercare  Weakness  Difficulty walking      Subjective Assessment - 11/02/15 1715    Subjective Pt states that her knee feels stiff at times. Still uses ice, and does the soft tissue around the scar. She also forgot to take her pain medication   Pertinent History S/P R TKA 10/03/15 secondary to knee pain from arthritis. Prior to surgery, pt had a lot of difficulty walking and negotiating stairs due to her pain. Did not use AD but had difficulty. Knee feels better after surgery even though has pain. Participated in home health PT for 2 weeks which involved  knee flexion, extension exercises, ankle PF/DF  and walking.  Pt also adds that she picked up her 4 month old grand child (15-16 lbs) which caused increased swelling 10/20/15 . MD checked her knee out and stated that pt broke up some sutures inside her knee but no ligaments or tendons were injured.  Patient Stated Goals "I want to get rid of this walker and cane. I just want to be able to walk and keep up with my family, feel good, and be strong (core strength).    Currently in Pain? No/denies   Multiple Pain Sites No       Objectives:   Manual therapy:  Seated assisted R knee flexion to end range then with static hold at end range knee flexion,  muscle energy technique to promote knee extension AROM  There-ex: Directed patient with standing heel-toe raises 10x3 each way,  Standing mini squats 10x3 with emphasis on glute max use and knee extension in the standing position, Side stepping 4 ft to the L and to the R 5x with bilateral UE assist using treadmill bars with emphasis on glute med muscle use, Standing R hip and knee flexion placing R foot onto first step (promote knee flexion and decrease circumduction) with bilateral UE assist 10x3, Standing  ankle DF/PF on rocker board 2 min  Improved exercise technique, movement at target joints, use of target muscles after mod verbal, visual, tactile cues.     R knee AROM seated: knee extension -6 degrees, knee flexion 96 degrees at beginning of session. R knee flexion seated AROM improved to 113 degrees and knee extension improved to -4 degrees after manual therapy. Pt also demonstrates R quadriceps weakness with standing exercises.                               PT Education - 11/02/15 1821    Education provided Yes   Education Details ther-ex   [employer]) Educated Patient   Methods Explanation;Demonstration;Tactile cues;Verbal cues   Comprehension Verbalized understanding;Returned demonstration             PT Long Term Goals - 10/26/15 1757    PT LONG TERM GOAL #1   Title Patient will improve R knee extension AROM to 0 degrees to improve stance phase of gait   Time 6   Period Weeks   Status New   PT LONG TERM GOAL #2   Title Patient will improve R knee flexion AROM to at least 115 degrees to promote ability to negotiate stairs to enter her home.    Time 6   Period Weeks   Status New   PT LONG TERM GOAL #3   Title Patient will be able to ambulate with SPC modified independent for at least 500 ft to promote mobility   Time 6   Period Weeks   Status New   PT LONG TERM GOAL #4   Title Patient will improve her LEFS score by at least 9 points as a demonstration of improved function.    Baseline 14/80   Time 6   Period Weeks   Status New               Plan - 11/02/15 1716    Clinical Impression Statement R knee AROM seated: knee extension -6 degrees, knee flexion 96 degrees at beginning of session. R knee flexion seated AROM improved to 113 degrees and knee extension improved to -4 degrees after manual therapy. Pt also demonstrates R quadriceps weakness with standing exercises.    Pt will benefit from skilled therapeutic intervention in order  to improve on the following deficits Pain;Decreased strength;Decreased scar mobility;Difficulty walking;Decreased range of motion;Abnormal gait   Rehab Potential Good   Clinical Impairments Affecting Rehab Potential No known clinical impairments  affecting rehab potential.   PT Frequency 2x / week   PT Duration 6 weeks   PT Treatment/Interventions Therapeutic exercise;Manual techniques;Therapeutic activities;Electrical Stimulation;Gait training;Patient/family education;Neuromuscular re-education   PT Next Visit Plan soft tissue mobilization, knee flexion and extension ROM, gait, decrease swelling   Consulted and Agree with Plan of Care Patient        Problem List Patient Active Problem List   Diagnosis Date Noted  . OA (osteoarthritis) of knee 10/03/2015  . ETD (eustachian tube dysfunction) 09/16/2015  . Arthritis 06/30/2015  . H/O gastrointestinal disease 06/30/2015  . Depression, major, recurrent, moderate ([place]) 06/30/2015  . Aortic atherosclerosis ([place]) 02/08/2015  . Toenail deformity 02/08/2015  . Hyponatremia 01/04/2015  . Abdominal pain, right upper quadrant 11/24/2014  . Kidney cysts 05/25/2014  . Medicare annual wellness visit, subsequent 05/10/2014  . Screening for skin cancer 05/10/2014  . Tremor 05/10/2014  . GERD (gastroesophageal reflux disease) 02/05/2014  . Insomnia 01/01/2014  . Hot flashes 01/01/2014  . Glaucoma 03/12/2013  . Back pain 02/25/2013  . Depression 01/29/2013  . Allergic to latex 10/28/2012  . Bladder infection, chronic 10/28/2012  . Cystocele, midline 10/28/2012  . Female genuine stress incontinence 10/28/2012  . Incomplete bladder emptying 10/28/2012  . LBP (low back pain) 10/28/2012  . Neuralgia neuritis, sciatic nerve 10/28/2012  . Incomplete uterine prolapse 10/28/2012  . Urge incontinence 10/28/2012  . Screening for breast cancer 10/02/2012  . Hypertension 10/02/2012  . Osteoarthritis 01/10/2012  . Hyperlipidemia 10/24/2011  .  Generalized anxiety disorder 10/24/2011    Joneen Boers PT, DPT    11/02/2015, 6:22 PM  [place] [facility] [address], [place], [address] Phone: [phone]   Fax:  [phone]  Name: Michele Meyer MRN: [id number] Date of Birth: 27-Jul-1946

## 2015-11-07 ENCOUNTER — Ambulatory Visit: Payer: Medicare Other

## 2015-11-07 DIAGNOSIS — R531 Weakness: Secondary | ICD-10-CM

## 2015-11-07 DIAGNOSIS — R262 Difficulty in walking, not elsewhere classified: Secondary | ICD-10-CM

## 2015-11-07 DIAGNOSIS — Z4789 Encounter for other orthopedic aftercare: Secondary | ICD-10-CM | POA: Diagnosis not present

## 2015-11-07 NOTE — Patient Instructions (Signed)
Mini-Squats (Standing)    Stand with support for safety. Bend knees slightly. Tighten pelvic floor. Hold for _0__ seconds. Return to straight standing. Repeat __10_ times. Perform 3 sets. Do __1_ times a day.  Copyright  VHI. All rights reserved.

## 2015-11-07 NOTE — Therapy (Signed)
[place] [facility] [address], [place], [address] Phone: [phone]   Fax:  [phone]  Physical Therapy Treatment  Patient Details  Name: Michele Meyer MRN: [id number] Date of Birth: 1946/08/25 Referring Provider: Gaynelle Arabian, MD  Encounter Date: 11/07/2015      PT End of Session - 11/07/15 1302    Visit Number 4   Number of Visits 13   Date for PT Re-Evaluation 12/08/15   Authorization Type 4   Authorization Time Period of 10   PT Start Time 1302   PT Stop Time 1348   PT Time Calculation (min) 46 min   Equipment Utilized During Treatment Gait belt  rw   Activity Tolerance Patient tolerated treatment well   Behavior During Therapy [facility] for tasks assessed/performed      Past Medical History  Diagnosis Date  . Anxiety   . Cystocele   . Incomplete bladder emptying   . Chronic cystitis   . Stress incontinence   . Depression   . UTI (lower urinary tract infection)   . Skin cancer   . Arthritis   . Endometriosis   . Glaucoma     both eyes  . Uterovaginal prolapse, incomplete   . Skin cancer     basal and squamous cell  . Labile hypertension   . Dyslipidemia   . Cyst of right kidney   . Diverticulosis   . Gestational diabetes mellitus 30 years ago    with pregnancy   . Bladder filling defect     bladder sling protrusing last 3 years   . PONV (postoperative nausea and vomiting)     Past Surgical History  Procedure Laterality Date  . Removal of first rib      bilaterally  . Prolapsed bladder      Repair Dr.Cope  . Vein ligation and stripping Bilateral   . Breast biopsy    . Tonsillectomy    . Appendectomy    . Breast lumpectomy      benign  . Pubovaginal sling  4 years ago    protrusion of bladder sling for last 3 years  . Anterior and posterior vaginal repair    . Abdominal hysterectomy      complete  . Total knee arthroplasty Right 10/03/2015    Procedure: RIGHT TOTAL KNEE  ARTHROPLASTY;  Surgeon: Gaynelle Arabian, MD;  [facility]: [facility];  Service: Orthopedics;  Laterality: Right;    There were no vitals filed for this visit.  Visit Diagnosis:  Orthopedic aftercare  Weakness  Difficulty walking      Subjective Assessment - 11/07/15 1303    Subjective Pt states taking a pain medication before therapy.    Pertinent History S/P R TKA 10/03/15 secondary to knee pain from arthritis. Prior to surgery, pt had a lot of difficulty walking and negotiating stairs due to her pain. Did not use AD but had difficulty. Knee feels better after surgery even though has pain. Participated in home health PT for 2 weeks which involved  knee flexion, extension exercises, ankle PF/DF  and walking.  Pt also adds that she picked up her 43 month old grand child (15-16 lbs) which caused increased swelling 10/20/15 . MD checked her knee out and stated that pt broke up some sutures inside her knee but no ligaments or tendons were injured.    Patient Stated Goals "I want to get rid of this walker and cane. I just want to be  able to walk and keep up with my family, feel good, and be strong (core strength).    Currently in Pain? Yes   Pain Score 2    Multiple Pain Sites No        Objectives:   Manual therapy:  Seated assisted R knee flexion to end range then with static hold at end range knee flexion,  muscle energy technique to promote knee extension AROM  There-ex: Directed patient with standing heel-toe raises 10x3 each way,  Standing mini squats 10x3 with emphasis on glute max use and knee extension in the standing position,  Side stepping 4 ft to the L and to the R 5x with bilateral UE assist using treadmill bars with emphasis on glute med muscle use, Gait with SPC on L 15 ft, then 32 ft 4x, then 20 ft   Improved exercise technique, movement at target joints, use of target muscles after mod verbal, visual, tactile cues.    R knee AROM seated: knee extension -8 degrees,  knee flexion 103 degrees at beginning of session. Improved to -5 degrees seated R knee extension, and 115 degrees R knee flexion AROM  after manual therapy. Able to ambulate with SPC L LE without LOB.                        PT Education - 11/07/15 1324    Education provided Yes   Education Details ther-ex   [employer]) Educated Patient   Methods Explanation;Demonstration;Tactile cues;Verbal cues   Comprehension Verbalized understanding;Returned demonstration             PT Long Term Goals - 10/26/15 1757    PT LONG TERM GOAL #1   Title Patient will improve R knee extension AROM to 0 degrees to improve stance phase of gait   Time 6   Period Weeks   Status New   PT LONG TERM GOAL #2   Title Patient will improve R knee flexion AROM to at least 115 degrees to promote ability to negotiate stairs to enter her home.    Time 6   Period Weeks   Status New   PT LONG TERM GOAL #3   Title Patient will be able to ambulate with SPC modified independent for at least 500 ft to promote mobility   Time 6   Period Weeks   Status New   PT LONG TERM GOAL #4   Title Patient will improve her LEFS score by at least 9 points as a demonstration of improved function.    Baseline 14/80   Time 6   Period Weeks   Status New               Plan - 11/07/15 1325    Clinical Impression Statement R knee AROM seated: knee extension -8 degrees, knee flexion 103 degrees at beginning of session. Improved to -5 degrees seated R knee extension, and 115 degrees R knee flexion AROM  after manual therapy. Able to ambulate with SPC L side without LOB.    Pt will benefit from skilled therapeutic intervention in order to improve on the following deficits Pain;Decreased strength;Decreased scar mobility;Difficulty walking;Decreased range of motion;Abnormal gait   Rehab Potential Good   Clinical Impairments Affecting Rehab Potential No known clinical impairments affecting rehab potential.   PT  Frequency 2x / week   PT Duration 6 weeks   PT Treatment/Interventions Therapeutic exercise;Manual techniques;Therapeutic activities;Electrical Stimulation;Gait training;Patient/family education;Neuromuscular re-education   PT Next Visit Plan  soft tissue mobilization, knee flexion and extension ROM, gait, decrease swelling   Consulted and Agree with Plan of Care Patient        Problem List Patient Active Problem List   Diagnosis Date Noted  . OA (osteoarthritis) of knee 10/03/2015  . ETD (eustachian tube dysfunction) 09/16/2015  . Arthritis 06/30/2015  . H/O gastrointestinal disease 06/30/2015  . Depression, major, recurrent, moderate ([place]) 06/30/2015  . Aortic atherosclerosis ([place]) 02/08/2015  . Toenail deformity 02/08/2015  . Hyponatremia 01/04/2015  . Abdominal pain, right upper quadrant 11/24/2014  . Kidney cysts 05/25/2014  . Medicare annual wellness visit, subsequent 05/10/2014  . Screening for skin cancer 05/10/2014  . Tremor 05/10/2014  . GERD (gastroesophageal reflux disease) 02/05/2014  . Insomnia 01/01/2014  . Hot flashes 01/01/2014  . Glaucoma 03/12/2013  . Back pain 02/25/2013  . Depression 01/29/2013  . Allergic to latex 10/28/2012  . Bladder infection, chronic 10/28/2012  . Cystocele, midline 10/28/2012  . Female genuine stress incontinence 10/28/2012  . Incomplete bladder emptying 10/28/2012  . LBP (low back pain) 10/28/2012  . Neuralgia neuritis, sciatic nerve 10/28/2012  . Incomplete uterine prolapse 10/28/2012  . Urge incontinence 10/28/2012  . Screening for breast cancer 10/02/2012  . Hypertension 10/02/2012  . Osteoarthritis 01/10/2012  . Hyperlipidemia 10/24/2011  . Generalized anxiety disorder 10/24/2011    Joneen Boers PT, DPT    11/07/2015, 1:55 PM  [place] [facility] [address], [place], [address] Phone: [phone]   Fax:  [phone]  Name: Michele Meyer MRN:  [id number] Date of Birth: [birth year]

## 2015-11-09 ENCOUNTER — Ambulatory Visit: Payer: Medicare Other

## 2015-11-09 DIAGNOSIS — Z4789 Encounter for other orthopedic aftercare: Secondary | ICD-10-CM | POA: Diagnosis not present

## 2015-11-09 DIAGNOSIS — R262 Difficulty in walking, not elsewhere classified: Secondary | ICD-10-CM

## 2015-11-09 DIAGNOSIS — R531 Weakness: Secondary | ICD-10-CM

## 2015-11-09 NOTE — Therapy (Signed)
[place] [facility] [address] [place], [address] Phone: [phone]   Fax:  [phone]  Physical Therapy Treatment  Patient Details  Name: Michele Meyer MRN: [id number] Date of Birth: [birth year] Referring Provider: Gaynelle Arabian, MD  Encounter Date: 11/09/2015      PT End of Session - 11/09/15 1305    Visit Number 5   Number of Visits 13   Date for PT Re-Evaluation 12/08/15   Authorization Type 5   Authorization Time Period of 10   PT Start Time 1304   PT Stop Time 1348   PT Time Calculation (min) 44 min   Equipment Utilized During Treatment --  rw   Activity Tolerance Patient tolerated treatment well   Behavior During Therapy [facility] for tasks assessed/performed      Past Medical History  Diagnosis Date  . Anxiety   . Cystocele   . Incomplete bladder emptying   . Chronic cystitis   . Stress incontinence   . Depression   . UTI (lower urinary tract infection)   . Skin cancer   . Arthritis   . Endometriosis   . Glaucoma     both eyes  . Uterovaginal prolapse, incomplete   . Skin cancer     basal and squamous cell  . Labile hypertension   . Dyslipidemia   . Cyst of right kidney   . Diverticulosis   . Gestational diabetes mellitus 30 years ago    with pregnancy   . Bladder filling defect     bladder sling protrusing last 3 years   . PONV (postoperative nausea and vomiting)     Past Surgical History  Procedure Laterality Date  . Removal of first rib      bilaterally  . Prolapsed bladder      Repair Dr.Cope  . Vein ligation and stripping Bilateral   . Breast biopsy    . Tonsillectomy    . Appendectomy    . Breast lumpectomy      benign  . Pubovaginal sling  4 years ago    protrusion of bladder sling for last 3 years  . Anterior and posterior vaginal repair    . Abdominal hysterectomy      complete  . Total knee arthroplasty Right 10/03/2015    Procedure: RIGHT TOTAL KNEE  ARTHROPLASTY;  Surgeon: Gaynelle Arabian, MD;  [facility]: [facility];  Service: Orthopedics;  Laterality: Right;    There were no vitals filed for this visit.  Visit Diagnosis:  Orthopedic aftercare  Weakness  Difficulty walking      Subjective Assessment - 11/09/15 1305    Subjective Pt states that her surgeon was pleased with her ROM.    Pertinent History S/P R TKA 10/03/15 secondary to knee pain from arthritis. Prior to surgery, pt had a lot of difficulty walking and negotiating stairs due to her pain. Did not use AD but had difficulty. Knee feels better after surgery even though has pain. Participated in home health PT for 2 weeks which involved  knee flexion, extension exercises, ankle PF/DF  and walking.  Pt also adds that she picked up her 18 month old grand child (15-16 lbs) which caused increased swelling 10/20/15 . MD checked her knee out and stated that pt broke up some sutures inside her knee but no ligaments or tendons were injured.    Patient Stated Goals "I want to get rid of this walker and cane. I just want to  be able to walk and keep up with my family, feel good, and be strong (core strength).    Currently in Pain? No/denies   Multiple Pain Sites No      Objectives:   Manual therapy:  Seated assisted R knee flexion to end range then with static hold at end range knee flexion,  muscle energy technique to promote knee extension AROM  There-ex:  Directed patient with supine SLR R hip flexion 10x3 (reviewed and given as part of her HEP; pt demonstrated and verbalized understanding),  Gait with SPC on L  110 ft,  100 ft,  100 ft with CGA to SBA  Standing mini squats with bilateral UE assist 10x3  Improved exercise technique, movement at target joints, use of target muscles after mod verbal, visual, tactile cues.    R knee AROM seated: knee extension - 4 degrees, knee flexion 115 degrees  after manual therapy. Able to ambulate with SPC L side without LOB again today  for longer distance compared to last session. Patient  making progress towards goals.                              PT Education - 11/09/15 1311    Education provided Yes   Education Details ther-ex   [employer]) Educated Patient   Methods Explanation;Demonstration;Tactile cues;Verbal cues   Comprehension Verbalized understanding;Returned demonstration             PT Long Term Goals - 10/26/15 1757    PT LONG TERM GOAL #1   Title Patient will improve R knee extension AROM to 0 degrees to improve stance phase of gait   Time 6   Period Weeks   Status New   PT LONG TERM GOAL #2   Title Patient will improve R knee flexion AROM to at least 115 degrees to promote ability to negotiate stairs to enter her home.    Time 6   Period Weeks   Status New   PT LONG TERM GOAL #3   Title Patient will be able to ambulate with SPC modified independent for at least 500 ft to promote mobility   Time 6   Period Weeks   Status New   PT LONG TERM GOAL #4   Title Patient will improve her LEFS score by at least 9 points as a demonstration of improved function.    Baseline 14/80   Time 6   Period Weeks   Status New               Plan - 11/09/15 1330    Clinical Impression Statement R knee AROM seated: knee extension - 4 degrees, knee flexion 115 degrees  after manual therapy. Able to ambulate with SPC L side without LOB again today for longer distance compared to last session. Patient  making progress towards goals.    Pt will benefit from skilled therapeutic intervention in order to improve on the following deficits Pain;Decreased strength;Decreased scar mobility;Difficulty walking;Decreased range of motion;Abnormal gait   Rehab Potential Good   Clinical Impairments Affecting Rehab Potential No known clinical impairments affecting rehab potential.   PT Frequency 2x / week   PT Duration 6 weeks   PT Treatment/Interventions Therapeutic exercise;Manual  techniques;Therapeutic activities;Electrical Stimulation;Gait training;Patient/family education;Neuromuscular re-education   PT Next Visit Plan soft tissue mobilization, knee flexion and extension ROM, gait, decrease swelling   Consulted and Agree with Plan of Care Patient  Problem List Patient Active Problem List   Diagnosis Date Noted  . OA (osteoarthritis) of knee 10/03/2015  . ETD (eustachian tube dysfunction) 09/16/2015  . Arthritis 06/30/2015  . H/O gastrointestinal disease 06/30/2015  . Depression, major, recurrent, moderate ([place]) 06/30/2015  . Aortic atherosclerosis ([place]) 02/08/2015  . Toenail deformity 02/08/2015  . Hyponatremia 01/04/2015  . Abdominal pain, right upper quadrant 11/24/2014  . Kidney cysts 05/25/2014  . Medicare annual wellness visit, subsequent 05/10/2014  . Screening for skin cancer 05/10/2014  . Tremor 05/10/2014  . GERD (gastroesophageal reflux disease) 02/05/2014  . Insomnia 01/01/2014  . Hot flashes 01/01/2014  . Glaucoma 03/12/2013  . Back pain 02/25/2013  . Depression 01/29/2013  . Allergic to latex 10/28/2012  . Bladder infection, chronic 10/28/2012  . Cystocele, midline 10/28/2012  . Female genuine stress incontinence 10/28/2012  . Incomplete bladder emptying 10/28/2012  . LBP (low back pain) 10/28/2012  . Neuralgia neuritis, sciatic nerve 10/28/2012  . Incomplete uterine prolapse 10/28/2012  . Urge incontinence 10/28/2012  . Screening for breast cancer 10/02/2012  . Hypertension 10/02/2012  . Osteoarthritis 01/10/2012  . Hyperlipidemia 10/24/2011  . Generalized anxiety disorder 10/24/2011    Joneen Boers PT, DPT   11/09/2015, 3:00 PM  [place] [facility] [address] [place], [address] Phone: [phone]   Fax:  [phone]  Name: Michele Meyer MRN: [id number] Date of Birth: [birth year]

## 2015-11-09 NOTE — Patient Instructions (Signed)
Flexion and Extension - Supine    Keep L knee bent (not shown). With R leg straight, raise it up as high as your L knee __10_ times. Repeat 3 times.  Do __1_ times per day.   Do not bend right knee.   Copyright  VHI. All rights reserved.

## 2015-11-14 ENCOUNTER — Ambulatory Visit: Payer: Medicare Other

## 2015-11-14 DIAGNOSIS — Z4789 Encounter for other orthopedic aftercare: Secondary | ICD-10-CM | POA: Diagnosis not present

## 2015-11-14 DIAGNOSIS — R262 Difficulty in walking, not elsewhere classified: Secondary | ICD-10-CM

## 2015-11-14 DIAGNOSIS — R531 Weakness: Secondary | ICD-10-CM

## 2015-11-14 NOTE — Therapy (Signed)
[place] [facility] [address] [place], [address] Phone: [phone]   Fax:  [phone]  Physical Therapy Treatment  Patient Details  Name: Michele Meyer MRN: [id number] Date of Birth: 02/08/1946 Referring Provider: Gaynelle Arabian, MD  Encounter Date: 11/14/2015      PT End of Session - 11/14/15 1348    Visit Number 6   Number of Visits 13   Date for PT Re-Evaluation 12/08/15   Authorization Type 6   Authorization Time Period of 10   PT Start Time 7564   PT Stop Time 3329   PT Time Calculation (min) 48 min   Equipment Utilized During Treatment --  rw   Activity Tolerance Patient tolerated treatment well   Behavior During Therapy [facility] for tasks assessed/performed      Past Medical History  Diagnosis Date  . Anxiety   . Cystocele   . Incomplete bladder emptying   . Chronic cystitis   . Stress incontinence   . Depression   . UTI (lower urinary tract infection)   . Skin cancer   . Arthritis   . Endometriosis   . Glaucoma     both eyes  . Uterovaginal prolapse, incomplete   . Skin cancer     basal and squamous cell  . Labile hypertension   . Dyslipidemia   . Cyst of right kidney   . Diverticulosis   . Gestational diabetes mellitus 30 years ago    with pregnancy   . Bladder filling defect     bladder sling protrusing last 3 years   . PONV (postoperative nausea and vomiting)     Past Surgical History  Procedure Laterality Date  . Removal of first rib      bilaterally  . Prolapsed bladder      Repair Dr.Cope  . Vein ligation and stripping Bilateral   . Breast biopsy    . Tonsillectomy    . Appendectomy    . Breast lumpectomy      benign  . Pubovaginal sling  4 years ago    protrusion of bladder sling for last 3 years  . Anterior and posterior vaginal repair    . Abdominal hysterectomy      complete  . Total knee arthroplasty Right 10/03/2015    Procedure: RIGHT TOTAL KNEE  ARTHROPLASTY;  Surgeon: Gaynelle Arabian, MD;  Location: [facility];  Service: Orthopedics;  Laterality: Right;    There were no vitals filed for this visit.  Visit Diagnosis:  Orthopedic aftercare  Weakness  Difficulty walking      Subjective Assessment - 11/14/15 1351    Subjective Pt states not using her walker or SPC since last Wednesday 11/09/15. Also went to [facility] Saturday 11/12/15 and ended up walking/standing about 45 min and her knee swelled.    Pertinent History S/P R TKA 10/03/15 secondary to knee pain from arthritis. Prior to surgery, pt had a lot of difficulty walking and negotiating stairs due to her pain. Did not use AD but had difficulty. Knee feels better after surgery even though has pain. Participated in home health PT for 2 weeks which involved  knee flexion, extension exercises, ankle PF/DF  and walking.  Pt also adds that she picked up her 61 month old grand child (15-16 lbs) which caused increased swelling 10/20/15 . MD checked her knee out and stated that pt broke up some sutures inside her knee but no ligaments or tendons were injured.  Patient Stated Goals "I want to get rid of this walker and cane. I just want to be able to walk and keep up with my family, feel good, and be strong (core strength).    Currently in Pain? Yes   Pain Score 2   Pt also did not take pain medication.    Multiple Pain Sites No      Objectives:   Manual therapy:  Seated assisted R knee flexion to end range then with static hold at end range knee flexion,  Supine with R knee propped on pillow: soft tissue mobilization to distal hamstrings to promote knee extension.     There-ex:  Directed patient with supine SLR R hip flexion 10x3  Supine R knee flexion 10x2 with 5 second holds using small physioball Then extension using small physioball 10x2 with 5 second holds (reviewed and given as part of her HEP; pt demonstrated and verbalized understanding) Gait without AD but holding SPC 300  ft Side stepping 20 ft x 2 each direction to promote glute med strength Pt was recommended to use [facility] for now secondary to being more steady with gait using the AD. Pt verbalized understanding.    Improved exercise technique, movement at target joints, use of target muscles after mod verbal, visual, tactile cues.     R knee flexion AROM 115 degrees, R knee extension -2 degrees (quad set) after manual therapy. Able to ambulate without AD and no LOB, however, with lateral lean and slight antalgic pattern. More ease with gait observed when pt uses SPC. Pt was recommended to use [facility] for now to promote better gait pattern, and for safety. Pt verbalized understanding.                                 PT Education - 11/14/15 1417    Education provided Yes   Education Details ther-ex, HEP   Person(s) Educated Patient   Methods Explanation;Demonstration;Tactile cues;Verbal cues;Handout   Comprehension Verbalized understanding;Returned demonstration             PT Long Term Goals - 11/14/15 1455    PT LONG TERM GOAL #1   Title Patient will improve R knee extension AROM to 0 degrees to improve stance phase of gait   Time 6   Period Weeks   Status On-going   PT LONG TERM GOAL #2   Title Patient will improve R knee flexion AROM to at least 115 degrees to promote ability to negotiate stairs to enter her home.    Time 6   Period Weeks   Status On-going  potentially met   PT LONG TERM GOAL #3   Title Patient will be able to ambulate with SPC modified independent for at least 500 ft to promote mobility   Time 6   Period Weeks   Status Achieved   PT LONG TERM GOAL #4   Title Patient will improve her LEFS score by at least 9 points as a demonstration of improved function.    Baseline 14/80   Time 6   Period Weeks   Status On-going               Plan - 11/14/15 1426    Clinical Impression Statement R knee flexion AROM 115 degrees, R knee extension -2  degrees (quad set) after manual therapy. Able to ambulate without AD and no LOB, however, with lateral lean and slight antalgic pattern. More  ease with gait observed when pt uses SPC. Pt was recommended to use [facility] for now to promote better gait pattern, and for safety. Pt verbalized understanding.    Pt will benefit from skilled therapeutic intervention in order to improve on the following deficits Pain;Decreased strength;Decreased scar mobility;Difficulty walking;Decreased range of motion;Abnormal gait   Rehab Potential Good   Clinical Impairments Affecting Rehab Potential No known clinical impairments affecting rehab potential.   PT Frequency 2x / week   PT Duration 6 weeks   PT Treatment/Interventions Therapeutic exercise;Manual techniques;Therapeutic activities;Electrical Stimulation;Gait training;Patient/family education;Neuromuscular re-education   PT Next Visit Plan soft tissue mobilization, knee flexion and extension ROM, gait, decrease swelling   Consulted and Agree with Plan of Care Patient        Problem List Patient Active Problem List   Diagnosis Date Noted  . OA (osteoarthritis) of knee 10/03/2015  . ETD (eustachian tube dysfunction) 09/16/2015  . Arthritis 06/30/2015  . H/O gastrointestinal disease 06/30/2015  . Depression, major, recurrent, moderate ([place]) 06/30/2015  . Aortic atherosclerosis ([place]) 02/08/2015  . Toenail deformity 02/08/2015  . Hyponatremia 01/04/2015  . Abdominal pain, right upper quadrant 11/24/2014  . Kidney cysts 05/25/2014  . Medicare annual wellness visit, subsequent 05/10/2014  . Screening for skin cancer 05/10/2014  . Tremor 05/10/2014  . GERD (gastroesophageal reflux disease) 02/05/2014  . Insomnia 01/01/2014  . Hot flashes 01/01/2014  . Glaucoma 03/12/2013  . Back pain 02/25/2013  . Depression 01/29/2013  . Allergic to latex 10/28/2012  . Bladder infection, chronic 10/28/2012  . Cystocele, midline 10/28/2012  . Female genuine stress  incontinence 10/28/2012  . Incomplete bladder emptying 10/28/2012  . LBP (low back pain) 10/28/2012  . Neuralgia neuritis, sciatic nerve 10/28/2012  . Incomplete uterine prolapse 10/28/2012  . Urge incontinence 10/28/2012  . Screening for breast cancer 10/02/2012  . Hypertension 10/02/2012  . Osteoarthritis 01/10/2012  . Hyperlipidemia 10/24/2011  . Generalized anxiety disorder 10/24/2011    Joneen Boers PT, DPT   11/14/2015, 2:57 PM  [place] [facility] [address] [place], [address] Phone: [phone]   Fax:  [phone]  Name: Michele Meyer MRN: [id number] Date of Birth: August 27, 1946

## 2015-11-14 NOTE — Patient Instructions (Signed)
   On your back, roll a small physioball (exercise ball) with your heel to bend your knee. Hold for 5 seconds. Straighten out your leg, hold for 5 seconds. Repeat 10 times each direction. Perform 3   sets daily.

## 2015-11-16 ENCOUNTER — Ambulatory Visit: Payer: Medicare Other

## 2015-11-21 ENCOUNTER — Ambulatory Visit: Payer: Medicare Other

## 2015-11-21 DIAGNOSIS — Z4789 Encounter for other orthopedic aftercare: Secondary | ICD-10-CM

## 2015-11-21 DIAGNOSIS — R531 Weakness: Secondary | ICD-10-CM

## 2015-11-21 DIAGNOSIS — R262 Difficulty in walking, not elsewhere classified: Secondary | ICD-10-CM

## 2015-11-21 NOTE — Therapy (Signed)
[place] [facility] [address] [place], [address] Phone: [phone]   Fax:  [phone]  Physical Therapy Treatment  Patient Details  Name: Michele Meyer MRN: [id number] Date of Birth: 1946/04/22 Referring Provider: Gaynelle Arabian, MD  Encounter Date: 11/21/2015      PT End of Session - 11/21/15 1434    Visit Number 7   Number of Visits 13   Date for PT Re-Evaluation 12/08/15   Authorization Type 7   Authorization Time Period of 10   PT Start Time 1435   PT Stop Time 1519   PT Time Calculation (min) 44 min   Equipment Utilized During Treatment --  rw   Activity Tolerance Patient tolerated treatment well   Behavior During Therapy [facility] for tasks assessed/performed      Past Medical History  Diagnosis Date  . Anxiety   . Cystocele   . Incomplete bladder emptying   . Chronic cystitis   . Stress incontinence   . Depression   . UTI (lower urinary tract infection)   . Skin cancer   . Arthritis   . Endometriosis   . Glaucoma     both eyes  . Uterovaginal prolapse, incomplete   . Skin cancer     basal and squamous cell  . Labile hypertension   . Dyslipidemia   . Cyst of right kidney   . Diverticulosis   . Gestational diabetes mellitus 30 years ago    with pregnancy   . Bladder filling defect     bladder sling protrusing last 3 years   . PONV (postoperative nausea and vomiting)     Past Surgical History  Procedure Laterality Date  . Removal of first rib      bilaterally  . Prolapsed bladder      Repair Dr.Cope  . Vein ligation and stripping Bilateral   . Breast biopsy    . Tonsillectomy    . Appendectomy    . Breast lumpectomy      benign  . Pubovaginal sling  4 years ago    protrusion of bladder sling for last 3 years  . Anterior and posterior vaginal repair    . Abdominal hysterectomy      complete  . Total knee arthroplasty Right 10/03/2015    Procedure: RIGHT TOTAL KNEE  ARTHROPLASTY;  Surgeon: Gaynelle Arabian, MD;  Location: [facility];  Service: Orthopedics;  Laterality: Right;    There were no vitals filed for this visit.  Visit Diagnosis:  Orthopedic aftercare  Weakness  Difficulty walking      Subjective Assessment - 11/21/15 1435    Subjective Pt states feeling a "drawing feeling" (spasm)  bilateral LE (started from feet, legs, thighs) yesterday. Also felt it earlier today when she got a peticure. Has been going on since surgery.     Pertinent History S/P R TKA 10/03/15 secondary to knee pain from arthritis. Prior to surgery, pt had a lot of difficulty walking and negotiating stairs due to her pain. Did not use AD but had difficulty. Knee feels better after surgery even though has pain. Participated in home health PT for 2 weeks which involved  knee flexion, extension exercises, ankle PF/DF  and walking.  Pt also adds that she picked up her 40 month old grand child (15-16 lbs) which caused increased swelling 10/20/15 . MD checked her knee out and stated that pt broke up some sutures inside her knee but no ligaments or  tendons were injured.    Patient Stated Goals "I want to get rid of this walker and cane. I just want to be able to walk and keep up with my family, feel good, and be strong (core strength).    Currently in Pain? Yes   Pain Score 3    Multiple Pain Sites No     Objectives:  There-ex:  Slump movement: LE neural tension along sciatic nerve R > L which decreased with cervical extension followed by cervical flexion   seated LE neural flossing (LAQ with trunk flexion/extension at hip joint) 10x3 each LE,  Supine R hip SLR flexion 10x3 Standing ankle DF/PF on rockerboard x 2 min Gait without AD but holding SPC 200 ft Side stepping 32 ft x2 without AD   Improved exercise technique, movement at target joints, use of target muscles after mod verbal, visual, tactile cues.    Manual therapy:  Seated muscle energy technique to promote knee  extension AROM.  Supine with R knee propped on pillow: soft tissue mobilization to distal hamstrings to promote knee extension.    Prior to session: seated R knee extension -8 degrees, knee flexion 110 degrees. Improved to - 5 degrees knee extension after manual therapy. Able to ambulate without use of AD and no LOB but with lateral lean.                              PT Education - 11/21/15 1442    Education provided Yes   Education Details ther-ex   [employer]) Educated Patient   Methods Explanation;Demonstration;Tactile cues;Verbal cues   Comprehension Verbalized understanding;Returned demonstration             PT Long Term Goals - 11/14/15 1455    PT LONG TERM GOAL #1   Title Patient will improve R knee extension AROM to 0 degrees to improve stance phase of gait   Time 6   Period Weeks   Status On-going   PT LONG TERM GOAL #2   Title Patient will improve R knee flexion AROM to at least 115 degrees to promote ability to negotiate stairs to enter her home.    Time 6   Period Weeks   Status On-going  potentially met   PT LONG TERM GOAL #3   Title Patient will be able to ambulate with SPC modified independent for at least 500 ft to promote mobility   Time 6   Period Weeks   Status Achieved   PT LONG TERM GOAL #4   Title Patient will improve her LEFS score by at least 9 points as a demonstration of improved function.    Baseline 14/80   Time 6   Period Weeks   Status On-going               Plan - 11/21/15 1442    Clinical Impression Statement Prior to session: seated R knee extension -8 degrees, knee flexion 110 degrees. Improved to - 5 degrees knee extension after manual therapy. Able to ambulate without use of AD and no LOB but with lateral lean.     Pt will benefit from skilled therapeutic intervention in order to improve on the following deficits Pain;Decreased strength;Decreased scar mobility;Difficulty walking;Decreased range of  motion;Abnormal gait   Rehab Potential Good   Clinical Impairments Affecting Rehab Potential No known clinical impairments affecting rehab potential.   PT Frequency 2x / week   PT Duration 6 weeks  PT Treatment/Interventions Therapeutic exercise;Manual techniques;Therapeutic activities;Electrical Stimulation;Gait training;Patient/family education;Neuromuscular re-education   PT Next Visit Plan soft tissue mobilization, knee flexion and extension ROM, gait, decrease swelling   Consulted and Agree with Plan of Care Patient        Problem List Patient Active Problem List   Diagnosis Date Noted  . OA (osteoarthritis) of knee 10/03/2015  . ETD (eustachian tube dysfunction) 09/16/2015  . Arthritis 06/30/2015  . H/O gastrointestinal disease 06/30/2015  . Depression, major, recurrent, moderate ([place]) 06/30/2015  . Aortic atherosclerosis ([place]) 02/08/2015  . Toenail deformity 02/08/2015  . Hyponatremia 01/04/2015  . Abdominal pain, right upper quadrant 11/24/2014  . Kidney cysts 05/25/2014  . Medicare annual wellness visit, subsequent 05/10/2014  . Screening for skin cancer 05/10/2014  . Tremor 05/10/2014  . GERD (gastroesophageal reflux disease) 02/05/2014  . Insomnia 01/01/2014  . Hot flashes 01/01/2014  . Glaucoma 03/12/2013  . Back pain 02/25/2013  . Depression 01/29/2013  . Allergic to latex 10/28/2012  . Bladder infection, chronic 10/28/2012  . Cystocele, midline 10/28/2012  . Female genuine stress incontinence 10/28/2012  . Incomplete bladder emptying 10/28/2012  . LBP (low back pain) 10/28/2012  . Neuralgia neuritis, sciatic nerve 10/28/2012  . Incomplete uterine prolapse 10/28/2012  . Urge incontinence 10/28/2012  . Screening for breast cancer 10/02/2012  . Hypertension 10/02/2012  . Osteoarthritis 01/10/2012  . Hyperlipidemia 10/24/2011  . Generalized anxiety disorder 10/24/2011   Joneen Boers PT, DPT   11/21/2015, 7:40 PM  [place] [facility] [address], [place], [address] Phone: [phone]   Fax:  [phone]  Name: Michele Meyer MRN: [id number] Date of Birth: 04/16/1946

## 2015-11-21 NOTE — Patient Instructions (Signed)
EXTENSION: Sitting (Active)    Sit with feet flat. Straighten right knee as you lean back. Use _0__ lbs. Complete __3_ sets of __10_ repetitions. Perform __1_ sessions per day.  http://gtsc.exer.us/269   Copyright  VHI. All rights reserved.

## 2015-11-23 ENCOUNTER — Other Ambulatory Visit: Payer: Self-pay

## 2015-11-23 ENCOUNTER — Ambulatory Visit: Payer: Medicare Other

## 2015-11-23 NOTE — Telephone Encounter (Signed)
LAST VISIT: 10/13/2015 Pharmacy: [facility]  Request for metoprolol ER 25 mg tab.

## 2015-11-24 MED ORDER — METOPROLOL SUCCINATE ER 25 MG PO TB24: 12.5000 mg | ORAL_TABLET | Freq: Two times a day (BID) | ORAL | Status: DC

## 2015-11-28 ENCOUNTER — Ambulatory Visit: Payer: Medicare Other | Attending: Orthopedic Surgery

## 2015-11-28 DIAGNOSIS — R262 Difficulty in walking, not elsewhere classified: Secondary | ICD-10-CM | POA: Diagnosis present

## 2015-11-28 DIAGNOSIS — Z4789 Encounter for other orthopedic aftercare: Secondary | ICD-10-CM | POA: Insufficient documentation

## 2015-11-28 DIAGNOSIS — R531 Weakness: Secondary | ICD-10-CM | POA: Insufficient documentation

## 2015-11-28 NOTE — Therapy (Signed)
[facility] [address] [place], [address] Phone: [phone]   Fax:  [phone]  Physical Therapy Treatment  Patient Details  Name: Michele Meyer MRN: [id number] Date of Birth: [birth year] Referring Provider: Gaynelle Arabian, MD  Encounter Date: 11/28/2015      PT End of Session - 11/28/15 1355    Visit Number 8   Number of Visits 13   Date for PT Re-Evaluation 12/08/15   Authorization Type 8   Authorization Time Period of 10   PT Start Time 1354   PT Stop Time 1448   PT Time Calculation (min) 54 min   Equipment Utilized During Treatment --  rw   Activity Tolerance Patient tolerated treatment well   Behavior During Therapy [facility] for tasks assessed/performed      Past Medical History  Diagnosis Date  . Anxiety   . Cystocele   . Incomplete bladder emptying   . Chronic cystitis   . Stress incontinence   . Depression   . UTI (lower urinary tract infection)   . Skin cancer   . Arthritis   . Endometriosis   . Glaucoma     both eyes  . Uterovaginal prolapse, incomplete   . Skin cancer     basal and squamous cell  . Labile hypertension   . Dyslipidemia   . Cyst of right kidney   . Diverticulosis   . Gestational diabetes mellitus 30 years ago    with pregnancy   . Bladder filling defect     bladder sling protrusing last 3 years   . PONV (postoperative nausea and vomiting)     Past Surgical History  Procedure Laterality Date  . Removal of first rib      bilaterally  . Prolapsed bladder      Repair Dr.Cope  . Vein ligation and stripping Bilateral   . Breast biopsy    . Tonsillectomy    . Appendectomy    . Breast lumpectomy      benign  . Pubovaginal sling  4 years ago    protrusion of bladder sling for last 3 years  . Anterior and posterior vaginal repair    . Abdominal hysterectomy      complete  . Total knee arthroplasty Right 10/03/2015    Procedure: RIGHT TOTAL KNEE  ARTHROPLASTY;  Surgeon: Gaynelle Arabian, MD;  Location: [facility];  Service: Orthopedics;  Laterality: Right;    There were no vitals filed for this visit.  Visit Diagnosis:  Orthopedic aftercare  Weakness  Difficulty walking      Subjective Assessment - 11/28/15 1356    Subjective R knee is good. Drove to [place] 11/25/15. R knee was stiff, took breaks from car ride.    Pt states that her L knee swelled up around 11/20/15. Got a cortisone shot 11/23/15 which did not help much that time around. Walking bothers her L knee. Pain  back of L knee and medial L knee.  0/10 R knee pain currently. 6/10 L knee pain currently.    Pertinent History S/P R TKA 10/03/15 secondary to knee pain from arthritis. Prior to surgery, pt had a lot of difficulty walking and negotiating stairs due to her pain. Did not use AD but had difficulty. Knee feels better after surgery even though has pain. Participated in home health PT for 2 weeks which involved  knee flexion, extension exercises, ankle PF/DF  and walking.  Pt also adds that she  picked up her 44 month old grand child (15-16 lbs) which caused increased swelling 10/20/15 . MD checked her knee out and stated that pt broke up some sutures inside her knee but no ligaments or tendons were injured.    Patient Stated Goals "I want to get rid of this walker and cane. I just want to be able to walk and keep up with my family, feel good, and be strong (core strength).    Currently in Pain? Yes   Multiple Pain Sites Yes  bilateral knee pain       Objectives:  Manual therapy:  Low dye tape to L plantar arch (L foot pronation posture in standing ) to help decrease L knee pain to promote gait and standing portion of R TKA rehab. Decreased L medial knee pain in standing afterward.  Pt was recommended to get arch supports for her feet to promote a more neutral position for her L knee when standing. Pt verbalized understanding.   Muscle energy technique to promote R knee  extension AROM.   There-ex: Directed patient with seated R knee flexion AAROM  (rolling ball with foot) 10x5 seconds for 2 sets Standing ankle DF/PF on rocker board 2 min Forward step ups onto AirEx pad 5x with R LE with L UE assist.  Seated L knee flexion, extension AROM multiple times throughout session Nustep x 5 min Lv 1 (seat 7, arms 8) to promote LE movement without aggravating L knee pain.   Improved exercise technique, movement at target joints, use of target muscles after mod verbal, visual, tactile cues.      R knee extension AROM improved to -5 degrees from -8 degrees after manual therapy. 115 degrees seated R knee flexion AROM. Difficulty with standing activities involving R knee rehab secondary to L knee pain.                           PT Education - 11/28/15 1450    Education provided Yes   Education Details ther-ex, arch supports   Person(s) Educated Patient   Methods Explanation;Demonstration;Tactile cues;Verbal cues   Comprehension Returned demonstration;Verbalized understanding             PT Long Term Goals - 11/14/15 1455    PT LONG TERM GOAL #1   Title Patient will improve R knee extension AROM to 0 degrees to improve stance phase of gait   Time 6   Period Weeks   Status On-going   PT LONG TERM GOAL #2   Title Patient will improve R knee flexion AROM to at least 115 degrees to promote ability to negotiate stairs to enter her home.    Time 6   Period Weeks   Status On-going  potentially met   PT LONG TERM GOAL #3   Title Patient will be able to ambulate with SPC modified independent for at least 500 ft to promote mobility   Time 6   Period Weeks   Status Achieved   PT LONG TERM GOAL #4   Title Patient will improve her LEFS score by at least 9 points as a demonstration of improved function.    Baseline 14/80   Time 6   Period Weeks   Status On-going               Plan - 11/28/15 1449    Clinical Impression  Statement R knee extension AROM improved to -5 degrees from -8 degrees after manual  therapy. 115 degrees seated R knee flexion AROM. Difficulty with standing activities involving R knee rehab secondary to L knee pain. Pt states L knee feeling a lot better after session.    Pt will benefit from skilled therapeutic intervention in order to improve on the following deficits Pain;Decreased strength;Decreased scar mobility;Difficulty walking;Decreased range of motion;Abnormal gait   Rehab Potential Good   Clinical Impairments Affecting Rehab Potential No known clinical impairments affecting rehab potential.   PT Frequency 2x / week   PT Duration 6 weeks   PT Treatment/Interventions Therapeutic exercise;Manual techniques;Therapeutic activities;Electrical Stimulation;Gait training;Patient/family education;Neuromuscular re-education   PT Next Visit Plan soft tissue mobilization, knee flexion and extension ROM, gait, decrease swelling   Consulted and Agree with Plan of Care Patient        Problem List Patient Active Problem List   Diagnosis Date Noted  . OA (osteoarthritis) of knee 10/03/2015  . ETD (eustachian tube dysfunction) 09/16/2015  . Arthritis 06/30/2015  . H/O gastrointestinal disease 06/30/2015  . Depression, major, recurrent, moderate ([place]) 06/30/2015  . Aortic atherosclerosis ([place]) 02/08/2015  . Toenail deformity 02/08/2015  . Hyponatremia 01/04/2015  . Abdominal pain, right upper quadrant 11/24/2014  . Kidney cysts 05/25/2014  . Medicare annual wellness visit, subsequent 05/10/2014  . Screening for skin cancer 05/10/2014  . Tremor 05/10/2014  . GERD (gastroesophageal reflux disease) 02/05/2014  . Insomnia 01/01/2014  . Hot flashes 01/01/2014  . Glaucoma 03/12/2013  . Back pain 02/25/2013  . Depression 01/29/2013  . Allergic to latex 10/28/2012  . Bladder infection, chronic 10/28/2012  . Cystocele, midline 10/28/2012  . Female genuine stress incontinence 10/28/2012  .  Incomplete bladder emptying 10/28/2012  . LBP (low back pain) 10/28/2012  . Neuralgia neuritis, sciatic nerve 10/28/2012  . Incomplete uterine prolapse 10/28/2012  . Urge incontinence 10/28/2012  . Screening for breast cancer 10/02/2012  . Hypertension 10/02/2012  . Osteoarthritis 01/10/2012  . Hyperlipidemia 10/24/2011  . Generalized anxiety disorder 10/24/2011    Joneen Boers PT, DPT   11/28/2015, 2:52 PM  [facility] [address] [place], [address] Phone: [phone]   Fax:  [phone]  Name: MARSHELLE BILGER MRN: [id number] Date of Birth: 1946/07/20

## 2015-11-30 ENCOUNTER — Ambulatory Visit: Payer: Medicare Other

## 2015-11-30 DIAGNOSIS — R531 Weakness: Secondary | ICD-10-CM

## 2015-11-30 DIAGNOSIS — R262 Difficulty in walking, not elsewhere classified: Secondary | ICD-10-CM

## 2015-11-30 DIAGNOSIS — Z4789 Encounter for other orthopedic aftercare: Secondary | ICD-10-CM

## 2015-11-30 NOTE — Therapy (Signed)
[place] [facility] [address] [place], [address] Phone: [phone]   Fax:  [phone]  Physical Therapy Treatment  Patient Details  Name: Michele Meyer MRN: [id number] Date of Birth: 15-Apr-1946 Referring Provider: Gaynelle Arabian, MD  Encounter Date: 11/30/2015      PT End of Session - 11/30/15 1339    Visit Number 9   Number of Visits 13   Date for PT Re-Evaluation 12/08/15   Authorization Type 9   Authorization Time Period of 10   PT Start Time 1340   PT Stop Time 1430   PT Time Calculation (min) 50 min   Equipment Utilized During Treatment --  rw   Activity Tolerance Patient tolerated treatment well   Behavior During Therapy [facility] for tasks assessed/performed      Past Medical History  Diagnosis Date  . Anxiety   . Cystocele   . Incomplete bladder emptying   . Chronic cystitis   . Stress incontinence   . Depression   . UTI (lower urinary tract infection)   . Skin cancer   . Arthritis   . Endometriosis   . Glaucoma     both eyes  . Uterovaginal prolapse, incomplete   . Skin cancer     basal and squamous cell  . Labile hypertension   . Dyslipidemia   . Cyst of right kidney   . Diverticulosis   . Gestational diabetes mellitus 30 years ago    with pregnancy   . Bladder filling defect     bladder sling protrusing last 3 years   . PONV (postoperative nausea and vomiting)     Past Surgical History  Procedure Laterality Date  . Removal of first rib      bilaterally  . Prolapsed bladder      Repair Dr.Cope  . Vein ligation and stripping Bilateral   . Breast biopsy    . Tonsillectomy    . Appendectomy    . Breast lumpectomy      benign  . Pubovaginal sling  4 years ago    protrusion of bladder sling for last 3 years  . Anterior and posterior vaginal repair    . Abdominal hysterectomy      complete  . Total knee arthroplasty Right 10/03/2015    Procedure: RIGHT TOTAL KNEE  ARTHROPLASTY;  Surgeon: Gaynelle Arabian, MD;  Location: [facility];  Service: Orthopedics;  Laterality: Right;    There were no vitals filed for this visit.  Visit Diagnosis:  Orthopedic aftercare  Weakness  Difficulty walking      Subjective Assessment - 11/30/15 1340    Subjective R knee is good. Once in a while, she gets a burn pain which goes away. L knee was so much better after last session. Last night the tape would not stick any more. The tape on her L foot helped. Going to the store to get arch support.    Pertinent History S/P R TKA 10/03/15 secondary to knee pain from arthritis. Prior to surgery, pt had a lot of difficulty walking and negotiating stairs due to her pain. Did not use AD but had difficulty. Knee feels better after surgery even though has pain. Participated in home health PT for 2 weeks which involved  knee flexion, extension exercises, ankle PF/DF  and walking.  Pt also adds that she picked up her 28 month old grand child (15-16 lbs) which caused increased swelling 10/20/15 . MD checked her knee  out and stated that pt broke up some sutures inside her knee but no ligaments or tendons were injured.    Patient Stated Goals "I want to get rid of this walker and cane. I just want to be able to walk and keep up with my family, feel good, and be strong (core strength).    Currently in Pain? No/denies  No R knee pain   Multiple Pain Sites Yes  R and L knee    Pt currently 8 weeks post op.    Objectives:  There-ex: Directed patient with seated R knee flexion AAROM (rolling ball with foot) 10x5 seconds for 3 sets Standing ankle DF/PF on rocker board 2 min Forward step up onto 3 in step with R LE 10x3 with L UE assist    Then with one riser 2x5 with L UE assist Gait around gym without use of AD with glute max squeeze to promote proper femoral positioning L thigh to help decrease L knee pain 100 ft x2 to the L    and 100 ft x 2 to the R Sit <> stand from regular chair with  arms 4x5 with yellow band resisting hip abduction/ER to promote femoral control and LE strength T-band  (yellow) side step 6 ft to the R and L 6x with bilateral UE assist from treadmill bars.   Latex free band used.  Improved exercise technique, movement at target joints, use of target muscles after mod verbal, visual, tactile cues.      Pt able to ambulate without use of AD 100 ft x 4 without LOB. Pt also felt good glute med and max use with exercises and tolerated session without aggravation of L knee discomfort. No complain of R knee pain during session. Decreased L knee discomfort with closed chain activities with femoral control.                        PT Education - 11/30/15 1347    Education provided Yes   Education Details ther-ex   [employer]) Educated Patient   Methods Explanation;Demonstration;Tactile cues;Verbal cues   Comprehension Verbalized understanding;Returned demonstration             PT Long Term Goals - 11/14/15 1455    PT LONG TERM GOAL #1   Title Patient will improve R knee extension AROM to 0 degrees to improve stance phase of gait   Time 6   Period Weeks   Status On-going   PT LONG TERM GOAL #2   Title Patient will improve R knee flexion AROM to at least 115 degrees to promote ability to negotiate stairs to enter her home.    Time 6   Period Weeks   Status On-going  potentially met   PT LONG TERM GOAL #3   Title Patient will be able to ambulate with SPC modified independent for at least 500 ft to promote mobility   Time 6   Period Weeks   Status Achieved   PT LONG TERM GOAL #4   Title Patient will improve her LEFS score by at least 9 points as a demonstration of improved function.    Baseline 14/80   Time 6   Period Weeks   Status On-going               Plan - 11/30/15 1347    Clinical Impression Statement Pt able to ambulate without use of AD 100 ft x 4 without LOB. Pt also felt good  glute med and max use with  exercises and tolerated session without aggravation of L knee discomfort. No complain of R knee pain during session. Decreased L knee discomfort with closed chain activities with femoral control.    Pt will benefit from skilled therapeutic intervention in order to improve on the following deficits Pain;Decreased strength;Decreased scar mobility;Difficulty walking;Decreased range of motion;Abnormal gait   Rehab Potential Good   Clinical Impairments Affecting Rehab Potential No known clinical impairments affecting rehab potential.   PT Frequency 2x / week   PT Duration 6 weeks   PT Treatment/Interventions Therapeutic exercise;Manual techniques;Therapeutic activities;Electrical Stimulation;Gait training;Patient/family education;Neuromuscular re-education   PT Next Visit Plan soft tissue mobilization, knee flexion and extension ROM, gait, decrease swelling   Consulted and Agree with Plan of Care Patient        Problem List Patient Active Problem List   Diagnosis Date Noted  . OA (osteoarthritis) of knee 10/03/2015  . ETD (eustachian tube dysfunction) 09/16/2015  . Arthritis 06/30/2015  . H/O gastrointestinal disease 06/30/2015  . Depression, major, recurrent, moderate ([place]) 06/30/2015  . Aortic atherosclerosis ([place]) 02/08/2015  . Toenail deformity 02/08/2015  . Hyponatremia 01/04/2015  . Abdominal pain, right upper quadrant 11/24/2014  . Kidney cysts 05/25/2014  . Medicare annual wellness visit, subsequent 05/10/2014  . Screening for skin cancer 05/10/2014  . Tremor 05/10/2014  . GERD (gastroesophageal reflux disease) 02/05/2014  . Insomnia 01/01/2014  . Hot flashes 01/01/2014  . Glaucoma 03/12/2013  . Back pain 02/25/2013  . Depression 01/29/2013  . Allergic to latex 10/28/2012  . Bladder infection, chronic 10/28/2012  . Cystocele, midline 10/28/2012  . Female genuine stress incontinence 10/28/2012  . Incomplete bladder emptying 10/28/2012  . LBP (low back pain) 10/28/2012  .  Neuralgia neuritis, sciatic nerve 10/28/2012  . Incomplete uterine prolapse 10/28/2012  . Urge incontinence 10/28/2012  . Screening for breast cancer 10/02/2012  . Hypertension 10/02/2012  . Osteoarthritis 01/10/2012  . Hyperlipidemia 10/24/2011  . Generalized anxiety disorder 10/24/2011    Joneen Boers PT, DPT   11/30/2015, 2:40 PM  [facility] [address] [place], [address] Phone: [phone]   Fax:  [phone]  Name: Michele Meyer MRN: [id number] Date of Birth: 01-21-1946

## 2015-12-06 ENCOUNTER — Ambulatory Visit: Payer: Medicare Other

## 2015-12-08 ENCOUNTER — Ambulatory Visit: Payer: Medicare Other

## 2015-12-13 ENCOUNTER — Ambulatory Visit: Payer: Medicare Other

## 2015-12-27 ENCOUNTER — Ambulatory Visit: Payer: Medicare Other | Attending: Orthopedic Surgery | Admitting: Physical Therapy

## 2015-12-27 DIAGNOSIS — Z4789 Encounter for other orthopedic aftercare: Secondary | ICD-10-CM | POA: Insufficient documentation

## 2015-12-27 DIAGNOSIS — R262 Difficulty in walking, not elsewhere classified: Secondary | ICD-10-CM | POA: Diagnosis present

## 2015-12-27 DIAGNOSIS — R531 Weakness: Secondary | ICD-10-CM | POA: Diagnosis present

## 2015-12-27 NOTE — Therapy (Signed)
[facility] [address] [place], [address] Phone: [phone]   Fax:  [phone]  Physical Therapy Treatment/Progress Note  Patient Details  Name: Michele Meyer MRN: [id number] Date of Birth: 1946-09-05 Referring Provider: Gaynelle Arabian, MD  Encounter Date: 12/27/2015      PT End of Session - 12/27/15 1250    Visit Number 10   Number of Visits 13   Date for PT Re-Evaluation 01/17/16   Authorization Type 10   Authorization Time Period of 10   PT Start Time 1038   PT Stop Time 1118   PT Time Calculation (min) 40 min   Activity Tolerance Patient tolerated treatment well   Behavior During Therapy [facility] for tasks assessed/performed      Past Medical History  Diagnosis Date  . Anxiety   . Cystocele   . Incomplete bladder emptying   . Chronic cystitis   . Stress incontinence   . Depression   . UTI (lower urinary tract infection)   . Skin cancer   . Arthritis   . Endometriosis   . Glaucoma     both eyes  . Uterovaginal prolapse, incomplete   . Skin cancer     basal and squamous cell  . Labile hypertension   . Dyslipidemia   . Cyst of right kidney   . Diverticulosis   . Gestational diabetes mellitus 30 years ago    with pregnancy   . Bladder filling defect     bladder sling protrusing last 3 years   . PONV (postoperative nausea and vomiting)     Past Surgical History  Procedure Laterality Date  . Removal of first rib      bilaterally  . Prolapsed bladder      Repair Dr.Cope  . Vein ligation and stripping Bilateral   . Breast biopsy    . Tonsillectomy    . Appendectomy    . Breast lumpectomy      benign  . Pubovaginal sling  4 years ago    protrusion of bladder sling for last 3 years  . Anterior and posterior vaginal repair    . Abdominal hysterectomy      complete  . Total knee arthroplasty Right 10/03/2015    Procedure: RIGHT TOTAL KNEE ARTHROPLASTY;  Surgeon: Gaynelle Arabian,  MD;  Location: [facility];  Service: Orthopedics;  Laterality: Right;    There were no vitals filed for this visit.  Visit Diagnosis:  Orthopedic aftercare - Plan: PT plan of care cert/re-cert  Difficulty walking - Plan: PT plan of care cert/re-cert  Weakness - Plan: PT plan of care cert/re-cert      Subjective Assessment - 12/27/15 1047    Subjective Patient reports she now believes her R knee to be her better knee, she reports some increased smyptoms in L knee. Reports she feels her core is fairly weak and would like to join a gym and begin exercising though she would like advice on programming.    Pertinent History S/P R TKA 10/03/15 secondary to knee pain from arthritis. Prior to surgery, pt had a lot of difficulty walking and negotiating stairs due to her pain. Did not use AD but had difficulty. Knee feels better after surgery even though has pain. Participated in home health PT for 2 weeks which involved  knee flexion, extension exercises, ankle PF/DF  and walking.  Pt also adds that she picked up her 32 month old grand child (15-16 lbs)  which caused increased swelling 10/20/15 . MD checked her knee out and stated that pt broke up some sutures inside her knee but no ligaments or tendons were injured.    Patient Stated Goals "I want to get rid of this walker and cane. I just want to be able to walk and keep up with my family, feel good, and be strong (core strength).    Currently in Pain? No/denies  Pain in L knee with funcitonal activities, no pain in R knee now        LEFS - 47/80  AROM 0 -116  TherEx Sit to stand with use of hand son Rails 3 sets of 5, with minimal use of hands and use of chair behind to cue hip hinging. Challenging for her thigh musculature, no increase in pain   Step ups with bilateral HHA x 10 for 2 sets bilaterally no increase in symptoms   Side step ups x 10 repetitions (no symptoms on RLE with 1 HHA, symptoms and obvious weakness with LLE, discontinued.    Standing hip abductions 2 sets x 10 repetitions (no increase in symptoms, appropriate challenge for hip musculature)                          PT Education - 12/27/15 1250    Education provided Yes   Education Details Progression thus far with PT and transition from PT services to strengthening program.    Person(s) Educated Patient   Methods Explanation;Demonstration;Handout;Verbal cues   Comprehension Verbalized understanding;Returned demonstration             PT Long Term Goals - 12/27/15 1252    PT LONG TERM GOAL #1   Title Patient will improve R knee extension AROM to 0 degrees to improve stance phase of gait   Time 6   Period Weeks   Status Achieved   PT LONG TERM GOAL #2   Title Patient will improve R knee flexion AROM to at least 115 degrees to promote ability to negotiate stairs to enter her home.    Baseline 116 degrees 12/27/2015   Time 6   Period Weeks   Status Achieved   PT LONG TERM GOAL #3   Title Patient will be able to ambulate with SPC modified independent for at least 500 ft to promote mobility   Baseline Able to ambulate community distances with no device currently.    Time 6   Period Weeks   Status Achieved   PT LONG TERM GOAL #4   Title Patient will improve her LEFS score by at least 9 points as a demonstration of improved function.    Baseline 47/80 on 12/27/2015   Time 6   Period Weeks   Status Achieved   PT LONG TERM GOAL #5   Title Patient will report an LEFS score of at least 56/80 to demonstrate improved tolerance for ADLs.    Baseline 47/80 on 12/27/2015   Time 2   Period Weeks   Status New   Additional Long Term Goals   Additional Long Term Goals Yes   PT LONG TERM GOAL #6   Title Patient will perform sit to stand from chair without use of UEs to demonstrate improved LE strength and reduced falls risk.    Baseline Must use bilateral UEs to transfer sit to stand 12/27/2015   Time 2   Period Weeks   Status New   PT  LONG TERM GOAL #7  Title Patient will ascend/descend 4 steps in reciprocal fashion without use of UEs for balance to demonstrate improved dynamic balance and decreased falls risk.    Baseline Able to ascend/descend in reciprocal pattern with use of UEs.    Time 2   Period Weeks   Status New               Plan - 12/29/15 1251    Clinical Impression Statement Patient demonstrates significant improvement in balance with dynamic activities as well as improved function and ROM recently. She has met all current goals and new goals have been updated to reflect return to full participation in ADLs. Patient continues to demonstrate LE weakness, noted by inability to rise from chair without use of hands. Patient would benefit from additional PT sessions to progress HEP and develop a program for transition to gym based program.    Pt will benefit from skilled therapeutic intervention in order to improve on the following deficits Pain;Decreased strength;Decreased scar mobility;Difficulty walking;Decreased range of motion;Abnormal gait   Rehab Potential Good   Clinical Impairments Affecting Rehab Potential No known clinical impairments affecting rehab potential.   PT Frequency 2x / week   PT Duration 6 weeks   PT Treatment/Interventions Therapeutic exercise;Manual techniques;Therapeutic activities;Electrical Stimulation;Gait training;Patient/family education;Neuromuscular re-education   PT Next Visit Plan Progress LE strengthening program to transition to home based gym program.    Consulted and Agree with Plan of Care Patient          G-Codes - 12-29-15 1253    Functional Assessment Tool Used LEFS, patient interview, clinical presentation   Functional Limitation Mobility: Walking and moving around   Mobility: Walking and Moving Around Current Status (Y6168) At least 40 percent but less than 60 percent impaired, limited or restricted   Mobility: Walking and Moving Around Goal Status [phone]) At  least 20 percent but less than 40 percent impaired, limited or restricted      Problem List Patient Active Problem List   Diagnosis Date Noted  . OA (osteoarthritis) of knee 10/03/2015  . ETD (eustachian tube dysfunction) 09/16/2015  . Arthritis 06/30/2015  . H/O gastrointestinal disease 06/30/2015  . Depression, major, recurrent, moderate ([place]) 06/30/2015  . Aortic atherosclerosis ([place]) 02/08/2015  . Toenail deformity 02/08/2015  . Hyponatremia 01/04/2015  . Abdominal pain, right upper quadrant 11/24/2014  . Kidney cysts 05/25/2014  . Medicare annual wellness visit, subsequent 05/10/2014  . Screening for skin cancer 05/10/2014  . Tremor 05/10/2014  . GERD (gastroesophageal reflux disease) 02/05/2014  . Insomnia 01/01/2014  . Hot flashes 01/01/2014  . Glaucoma 03/12/2013  . Back pain 02/25/2013  . Depression 01/29/2013  . Allergic to latex 10/28/2012  . Bladder infection, chronic 10/28/2012  . Cystocele, midline 10/28/2012  . Female genuine stress incontinence 10/28/2012  . Incomplete bladder emptying 10/28/2012  . LBP (low back pain) 10/28/2012  . Neuralgia neuritis, sciatic nerve 10/28/2012  . Incomplete uterine prolapse 10/28/2012  . Urge incontinence 10/28/2012  . Screening for breast cancer 10/02/2012  . Hypertension 10/02/2012  . Osteoarthritis 01/10/2012  . Hyperlipidemia 10/24/2011  . Generalized anxiety disorder 10/24/2011    Kerman Passey, PT, DPT    2015-12-29, 3:39 PM  [facility] [address], [place], [address] Phone: [phone]   Fax:  [phone]  Name: Michele Meyer MRN: [id number] Date of Birth: 1946-03-02

## 2015-12-27 NOTE — Patient Instructions (Addendum)
   All exercises provided were adapted from [email or web address]. Patient was provided a written handout with pictures as described. Any additional cues were manually entered in to handout and copied in to this document.  Hip Abduction (10 times, 3 sets)   Standing tall, lift one leg out to the side then return.    SQUATS  While standing with feet shoulder width apart and in front of a stable support for balance assist if needed, bend your knees and lower your body towards the floor. Your body weight should mostly be directed through the heels of your feet. Return to a standing position.   Knees should bend in line with the 2nd toe and not pass the front of the foot.  ** Increase by 1 each session.

## 2016-01-05 ENCOUNTER — Ambulatory Visit: Payer: Medicare Other

## 2016-01-05 DIAGNOSIS — R531 Weakness: Secondary | ICD-10-CM

## 2016-01-05 DIAGNOSIS — Z4789 Encounter for other orthopedic aftercare: Secondary | ICD-10-CM

## 2016-01-05 DIAGNOSIS — R262 Difficulty in walking, not elsewhere classified: Secondary | ICD-10-CM

## 2016-01-05 NOTE — Therapy (Signed)
[facility] [address], [place], [address] Phone: [phone]   Fax:  [phone]  Physical Therapy Treatment  Patient Details  Name: Michele Meyer MRN: [id number] Date of Birth: 1946-11-30 Referring Provider: Gaynelle Arabian, MD  Encounter Date: 01/05/2016      PT End of Session - 01/05/16 1329    Visit Number 11   Number of Visits 19   Date for PT Re-Evaluation 01/17/16   Authorization Type 2   Authorization Time Period of 10   PT Start Time 1330   PT Stop Time 1430   PT Time Calculation (min) 60 min   Activity Tolerance Patient tolerated treatment well   Behavior During Therapy [facility] for tasks assessed/performed      Past Medical History  Diagnosis Date  . Anxiety   . Cystocele   . Incomplete bladder emptying   . Chronic cystitis   . Stress incontinence   . Depression   . UTI (lower urinary tract infection)   . Skin cancer   . Arthritis   . Endometriosis   . Glaucoma     both eyes  . Uterovaginal prolapse, incomplete   . Skin cancer     basal and squamous cell  . Labile hypertension   . Dyslipidemia   . Cyst of right kidney   . Diverticulosis   . Gestational diabetes mellitus 30 years ago    with pregnancy   . Bladder filling defect     bladder sling protrusing last 3 years   . PONV (postoperative nausea and vomiting)     Past Surgical History  Procedure Laterality Date  . Removal of first rib      bilaterally  . Prolapsed bladder      Repair Dr.Cope  . Vein ligation and stripping Bilateral   . Breast biopsy    . Tonsillectomy    . Appendectomy    . Breast lumpectomy      benign  . Pubovaginal sling  4 years ago    protrusion of bladder sling for last 3 years  . Anterior and posterior vaginal repair    . Abdominal hysterectomy      complete  . Total knee arthroplasty Right 10/03/2015    Procedure: RIGHT TOTAL KNEE ARTHROPLASTY;  Surgeon: Gaynelle Arabian, MD;  Location:  [facility];  Service: Orthopedics;  Laterality: Right;    There were no vitals filed for this visit.  Visit Diagnosis:  Orthopedic aftercare  Difficulty walking  Weakness      Subjective Assessment - 01/05/16 1329    Subjective Once in a while, has a quick R knee pain which is not frequent. L knee not bothering her since using her arch supports. No R knee pain currently. No L knee pain currently.    Pertinent History S/P R TKA 10/03/15 secondary to knee pain from arthritis. Prior to surgery, pt had a lot of difficulty walking and negotiating stairs due to her pain. Did not use AD but had difficulty. Knee feels better after surgery even though has pain. Participated in home health PT for 2 weeks which involved  knee flexion, extension exercises, ankle PF/DF  and walking.  Pt also adds that she picked up her 4 month old grand child (15-16 lbs) which caused increased swelling 10/20/15 . MD checked her knee out and stated that pt broke up some sutures inside her knee but no ligaments or tendons were injured.    Patient Stated  Goals "I want to get rid of this walker and cane. I just want to be able to walk and keep up with my family, feel good, and be strong (core strength).    Currently in Pain? No/denies   Pain Score 0-No pain   Multiple Pain Sites No            [facility] PT Assessment - 01/05/16 1338    AROM   Right Knee Extension -8   Right Knee Flexion 116   Strength   Right Knee Flexion 4/5   Right Knee Extension 5/5     Objectives:  Manual therapy  Muscle energy technique to promote knee extension R LE  R knee extension improved to -5 degrees afterwards   There-ex  Answered pt questions in regards to knee swelling (such as duration), knee discomfort, R paraspinal discomfort such as when standing and cooking.   Directed patient with self muscle energy technique to promote knee extension using 5 lb ankle weight distal thigh (not on knee) with R leg propped on chair 3x5 seconds  for 4 sets     Reviewed and given as part her her HEP. Pt demonstrated and verbalized understanding.    Sit <> stand from low mat table 4x5 with bilateral UE to no UE assist,  Forward step up onto regular step with R LE 10x3 with L UE assist  Standing abdominal contractions (to decrease R paraspinal overactivation)  Side stepping holding onto 5 lb weight each UE 32 ft x 2 each direction for glute med strength  Reviewed progress/current status with R knee flexion and extension AROM.   Improved exercise technique, movement at target joints, use of target muscles after mod verbal, visual, tactile cues.      Improved knee extension after muscle energy technique. Improved ability to perform sit <> stand transfers without UE assist after session.                          PT Education - 01/05/16 1406    Education provided Yes   Education Details ther-ex, HEP   Person(s) Educated Patient   Methods Explanation;Demonstration;Tactile cues;Verbal cues;Handout   Comprehension Verbalized understanding;Returned demonstration             PT Long Term Goals - 12/27/15 1252    PT LONG TERM GOAL #1   Title Patient will improve R knee extension AROM to 0 degrees to improve stance phase of gait   Time 6   Period Weeks   Status Achieved   PT LONG TERM GOAL #2   Title Patient will improve R knee flexion AROM to at least 115 degrees to promote ability to negotiate stairs to enter her home.    Baseline 116 degrees 12/27/2015   Time 6   Period Weeks   Status Achieved   PT LONG TERM GOAL #3   Title Patient will be able to ambulate with SPC modified independent for at least 500 ft to promote mobility   Baseline Able to ambulate community distances with no device currently.    Time 6   Period Weeks   Status Achieved   PT LONG TERM GOAL #4   Title Patient will improve her LEFS score by at least 9 points as a demonstration of improved function.    Baseline 47/80 on 12/27/2015    Time 6   Period Weeks   Status Achieved   PT LONG TERM GOAL #5   Title Patient  will report an LEFS score of at least 56/80 to demonstrate improved tolerance for ADLs.    Baseline 47/80 on 12/27/2015   Time 2   Period Weeks   Status New   Additional Long Term Goals   Additional Long Term Goals Yes   PT LONG TERM GOAL #6   Title Patient will perform sit to stand from chair without use of UEs to demonstrate improved LE strength and reduced falls risk.    Baseline Must use bilateral UEs to transfer sit to stand 12/27/2015   Time 2   Period Weeks   Status New   PT LONG TERM GOAL #7   Title Patient will ascend/descend 4 steps in reciprocal fashion without use of UEs for balance to demonstrate improved dynamic balance and decreased falls risk.    Baseline Able to ascend/descend in reciprocal pattern with use of UEs.    Time 2   Period Weeks   Status New               Plan - 01/05/16 1330    Clinical Impression Statement Improved knee extension AROM after muscle energy technique. Improved ability to perform sit <> stand transfers without UE assist after session. Decreased R paraspinal over activation with use of abdominal muscles.    Pt will benefit from skilled therapeutic intervention in order to improve on the following deficits Pain;Decreased strength;Decreased scar mobility;Difficulty walking;Decreased range of motion;Abnormal gait   Rehab Potential Good   Clinical Impairments Affecting Rehab Potential No known clinical impairments affecting rehab potential.   PT Frequency 2x / week   PT Duration 6 weeks   PT Treatment/Interventions Therapeutic exercise;Manual techniques;Therapeutic activities;Electrical Stimulation;Gait training;Patient/family education;Neuromuscular re-education   PT Next Visit Plan Progress LE strengthening program to transition to home based gym program.    Consulted and Agree with Plan of Care Patient        Problem List Patient Active Problem List    Diagnosis Date Noted  . OA (osteoarthritis) of knee 10/03/2015  . ETD (eustachian tube dysfunction) 09/16/2015  . Arthritis 06/30/2015  . H/O gastrointestinal disease 06/30/2015  . Depression, major, recurrent, moderate ([place]) 06/30/2015  . Aortic atherosclerosis ([place]) 02/08/2015  . Toenail deformity 02/08/2015  . Hyponatremia 01/04/2015  . Abdominal pain, right upper quadrant 11/24/2014  . Kidney cysts 05/25/2014  . Medicare annual wellness visit, subsequent 05/10/2014  . Screening for skin cancer 05/10/2014  . Tremor 05/10/2014  . GERD (gastroesophageal reflux disease) 02/05/2014  . Insomnia 01/01/2014  . Hot flashes 01/01/2014  . Glaucoma 03/12/2013  . Back pain 02/25/2013  . Depression 01/29/2013  . Allergic to latex 10/28/2012  . Bladder infection, chronic 10/28/2012  . Cystocele, midline 10/28/2012  . Female genuine stress incontinence 10/28/2012  . Incomplete bladder emptying 10/28/2012  . LBP (low back pain) 10/28/2012  . Neuralgia neuritis, sciatic nerve 10/28/2012  . Incomplete uterine prolapse 10/28/2012  . Urge incontinence 10/28/2012  . Screening for breast cancer 10/02/2012  . Hypertension 10/02/2012  . Osteoarthritis 01/10/2012  . Hyperlipidemia 10/24/2011  . Generalized anxiety disorder 10/24/2011    Joneen Boers PT, DPT   01/05/2016, 3:47 PM  [facility] [address] [place], [address] Phone: [phone]   Fax:  [phone]  Name: ASHAI CASABLANCA MRN: [id number] Date of Birth: [birth year]

## 2016-01-05 NOTE — Patient Instructions (Signed)
   Sitting on a chair with your right leg propped on another chair, 3 to 5 lb ankle weight just above the knee (not on it): gently bend your leg onto the chair for 5 seconds, then rest and let gravity extend your leg. Repeat 3 times then extend your leg by tightening your front thigh.  Perform 3 sets for 3 times a day.

## 2016-01-11 ENCOUNTER — Ambulatory Visit: Payer: Medicare Other

## 2016-01-11 DIAGNOSIS — Z4789 Encounter for other orthopedic aftercare: Secondary | ICD-10-CM | POA: Diagnosis not present

## 2016-01-11 DIAGNOSIS — R262 Difficulty in walking, not elsewhere classified: Secondary | ICD-10-CM

## 2016-01-11 DIAGNOSIS — R531 Weakness: Secondary | ICD-10-CM

## 2016-01-11 NOTE — Therapy (Signed)
[facility] [address] [place], [address] Phone: [phone]   Fax:  [phone]  Physical Therapy Treatment  Patient Details  Name: Michele Meyer MRN: [id number] Date of Birth: June 06, 1946 Referring Provider: Gaynelle Arabian, MD  Encounter Date: 01/11/2016      PT End of Session - 01/11/16 1036    Visit Number 12   Number of Visits 19   Date for PT Re-Evaluation 01/17/16   Authorization Type 3   Authorization Time Period of 10   PT Start Time 1035  Pt arrived late   PT Stop Time 1102   PT Time Calculation (min) 27 min   Activity Tolerance Patient tolerated treatment well   Behavior During Therapy [facility] for tasks assessed/performed      Past Medical History  Diagnosis Date  . Anxiety   . Cystocele   . Incomplete bladder emptying   . Chronic cystitis   . Stress incontinence   . Depression   . UTI (lower urinary tract infection)   . Skin cancer   . Arthritis   . Endometriosis   . Glaucoma     both eyes  . Uterovaginal prolapse, incomplete   . Skin cancer     basal and squamous cell  . Labile hypertension   . Dyslipidemia   . Cyst of right kidney   . Diverticulosis   . Gestational diabetes mellitus 30 years ago    with pregnancy   . Bladder filling defect     bladder sling protrusing last 3 years   . PONV (postoperative nausea and vomiting)     Past Surgical History  Procedure Laterality Date  . Removal of first rib      bilaterally  . Prolapsed bladder      Repair Dr.Cope  . Vein ligation and stripping Bilateral   . Breast biopsy    . Tonsillectomy    . Appendectomy    . Breast lumpectomy      benign  . Pubovaginal sling  4 years ago    protrusion of bladder sling for last 3 years  . Anterior and posterior vaginal repair    . Abdominal hysterectomy      complete  . Total knee arthroplasty Right 10/03/2015    Procedure: RIGHT TOTAL KNEE ARTHROPLASTY;  Surgeon: Gaynelle Arabian, MD;  Location: [facility];  Service: Orthopedics;  Laterality: Right;    There were no vitals filed for this visit.  Visit Diagnosis:  Orthopedic aftercare  Difficulty walking  Weakness      Subjective Assessment - 01/11/16 1042    Subjective Pt states that her L knee keeps her from going down the steps properly. L knee tends to hurt all over, might have to get surgery September.    Pertinent History S/P R TKA 10/03/15 secondary to knee pain from arthritis. Prior to surgery, pt had a lot of difficulty walking and negotiating stairs due to her pain. Did not use AD but had difficulty. Knee feels better after surgery even though has pain. Participated in home health PT for 2 weeks which involved  knee flexion, extension exercises, ankle PF/DF  and walking.  Pt also adds that she picked up her 68 month old grand child (15-16 lbs) which caused increased swelling 10/20/15 . MD checked her knee out and stated that pt broke up some sutures inside her knee but no ligaments or tendons were injured.    Patient Stated Goals "I want  to get rid of this walker and cane. I just want to be able to walk and keep up with my family, feel good, and be strong (core strength).    Currently in Pain? No/denies  for R knee      Objectives:  Manual therapy  Medial glide R patella grade 3 (decreased R anterior knee discomfort with sit <> stand)    There-ex Ascending and descending 4 regular steps with L UE assist 1x  Sit <> stand from low mat table (slightly higher than chair) 10x2 (R patellar discomfort which disappeared after medial glide to patella)  Forward step up and over and back using R LE on 3 inch step 10x  Forward step up and over 3 inch step with one UE to no UE assist (but with CGA to min A) 10x3   Improved exercise technique, movement at target joints, use of target muscles after mod verbal, visual, tactile cues.      Able to perform step up and over using 3 inch step with one riser  (regular stair step height) with R LE without compain of pain, good femoral control, and without UE assist towards end of session                        PT Education - 01/11/16 1223    Education provided Yes   Education Details ther-ex   [employer]) Educated Patient   Methods Explanation;Demonstration;Tactile cues;Verbal cues   Comprehension Verbalized understanding;Returned demonstration             PT Long Term Goals - 12/27/15 1252    PT LONG TERM GOAL #1   Title Patient will improve R knee extension AROM to 0 degrees to improve stance phase of gait   Time 6   Period Weeks   Status Achieved   PT LONG TERM GOAL #2   Title Patient will improve R knee flexion AROM to at least 115 degrees to promote ability to negotiate stairs to enter her home.    Baseline 116 degrees 12/27/2015   Time 6   Period Weeks   Status Achieved   PT LONG TERM GOAL #3   Title Patient will be able to ambulate with SPC modified independent for at least 500 ft to promote mobility   Baseline Able to ambulate community distances with no device currently.    Time 6   Period Weeks   Status Achieved   PT LONG TERM GOAL #4   Title Patient will improve her LEFS score by at least 9 points as a demonstration of improved function.    Baseline 47/80 on 12/27/2015   Time 6   Period Weeks   Status Achieved   PT LONG TERM GOAL #5   Title Patient will report an LEFS score of at least 56/80 to demonstrate improved tolerance for ADLs.    Baseline 47/80 on 12/27/2015   Time 2   Period Weeks   Status New   Additional Long Term Goals   Additional Long Term Goals Yes   PT LONG TERM GOAL #6   Title Patient will perform sit to stand from chair without use of UEs to demonstrate improved LE strength and reduced falls risk.    Baseline Must use bilateral UEs to transfer sit to stand 12/27/2015   Time 2   Period Weeks   Status New   PT LONG TERM GOAL #7   Title Patient will ascend/descend 4 steps in  reciprocal  fashion without use of UEs for balance to demonstrate improved dynamic balance and decreased falls risk.    Baseline Able to ascend/descend in reciprocal pattern with use of UEs.    Time 2   Period Weeks   Status New               Plan - 01/11/16 1224    Clinical Impression Statement Able to perform step up and over using 3 inch step with one riser (regular stair step height) with R LE without compain of pain, good femoral control, and without UE assist towards end of session    Pt will benefit from skilled therapeutic intervention in order to improve on the following deficits Pain;Decreased strength;Decreased scar mobility;Difficulty walking;Decreased range of motion;Abnormal gait   Rehab Potential Good   Clinical Impairments Affecting Rehab Potential No known clinical impairments affecting rehab potential.   PT Frequency 2x / week   PT Duration 6 weeks   PT Treatment/Interventions Therapeutic exercise;Manual techniques;Therapeutic activities;Electrical Stimulation;Gait training;Patient/family education;Neuromuscular re-education   PT Next Visit Plan Progress LE strengthening program to transition to home based gym program.    Consulted and Agree with Plan of Care Patient        Problem List Patient Active Problem List   Diagnosis Date Noted  . OA (osteoarthritis) of knee 10/03/2015  . ETD (eustachian tube dysfunction) 09/16/2015  . Arthritis 06/30/2015  . H/O gastrointestinal disease 06/30/2015  . Depression, major, recurrent, moderate ([place]) 06/30/2015  . Aortic atherosclerosis ([place]) 02/08/2015  . Toenail deformity 02/08/2015  . Hyponatremia 01/04/2015  . Abdominal pain, right upper quadrant 11/24/2014  . Kidney cysts 05/25/2014  . Medicare annual wellness visit, subsequent 05/10/2014  . Screening for skin cancer 05/10/2014  . Tremor 05/10/2014  . GERD (gastroesophageal reflux disease) 02/05/2014  . Insomnia 01/01/2014  . Hot flashes 01/01/2014  .  Glaucoma 03/12/2013  . Back pain 02/25/2013  . Depression 01/29/2013  . Allergic to latex 10/28/2012  . Bladder infection, chronic 10/28/2012  . Cystocele, midline 10/28/2012  . Female genuine stress incontinence 10/28/2012  . Incomplete bladder emptying 10/28/2012  . LBP (low back pain) 10/28/2012  . Neuralgia neuritis, sciatic nerve 10/28/2012  . Incomplete uterine prolapse 10/28/2012  . Urge incontinence 10/28/2012  . Screening for breast cancer 10/02/2012  . Hypertension 10/02/2012  . Osteoarthritis 01/10/2012  . Hyperlipidemia 10/24/2011  . Generalized anxiety disorder 10/24/2011    Joneen Boers PT, DPT   01/11/2016, 12:31 PM  [facility] [address], [place], [address] Phone: [phone]   Fax:  [phone]  Name: Michele Meyer MRN: [id number] Date of Birth: 10-Dec-1946

## 2016-01-17 ENCOUNTER — Ambulatory Visit: Payer: Medicare Other

## 2016-01-24 ENCOUNTER — Ambulatory Visit: Payer: Medicare Other

## 2016-01-25 ENCOUNTER — Ambulatory Visit: Payer: Medicare Other | Attending: Orthopedic Surgery

## 2016-01-25 DIAGNOSIS — R531 Weakness: Secondary | ICD-10-CM | POA: Insufficient documentation

## 2016-01-25 DIAGNOSIS — Z4789 Encounter for other orthopedic aftercare: Secondary | ICD-10-CM | POA: Insufficient documentation

## 2016-01-25 DIAGNOSIS — R262 Difficulty in walking, not elsewhere classified: Secondary | ICD-10-CM | POA: Insufficient documentation

## 2016-01-25 NOTE — Therapy (Signed)
[facility] [address] [place], [address] Phone: [phone]   Fax:  [phone]  Physical Therapy Treatment And Discharge Summary  Patient Details  Name: Michele Meyer MRN: [id number] Date of Birth: Oct 21, 1946 Referring Provider: Gaynelle Arabian, MD  Encounter Date: 01/25/2016      PT End of Session - 01/25/16 1046    Visit Number 13   Number of Visits 19   Date for PT Re-Evaluation 01/25/16   Authorization Type 4   Authorization Time Period of 10   PT Start Time 1047   PT Stop Time 1117   PT Time Calculation (min) 30 min   Activity Tolerance Patient tolerated treatment well   Behavior During Therapy [facility] for tasks assessed/performed      Past Medical History  Diagnosis Date  . Anxiety   . Cystocele   . Incomplete bladder emptying   . Chronic cystitis   . Stress incontinence   . Depression   . UTI (lower urinary tract infection)   . Skin cancer   . Arthritis   . Endometriosis   . Glaucoma     both eyes  . Uterovaginal prolapse, incomplete   . Skin cancer     basal and squamous cell  . Labile hypertension   . Dyslipidemia   . Cyst of right kidney   . Diverticulosis   . Gestational diabetes mellitus 30 years ago    with pregnancy   . Bladder filling defect     bladder sling protrusing last 3 years   . PONV (postoperative nausea and vomiting)     Past Surgical History  Procedure Laterality Date  . Removal of first rib      bilaterally  . Prolapsed bladder      Repair Dr.Cope  . Vein ligation and stripping Bilateral   . Breast biopsy    . Tonsillectomy    . Appendectomy    . Breast lumpectomy      benign  . Pubovaginal sling  4 years ago    protrusion of bladder sling for last 3 years  . Anterior and posterior vaginal repair    . Abdominal hysterectomy      complete  . Total knee arthroplasty Right 10/03/2015    Procedure: RIGHT TOTAL KNEE ARTHROPLASTY;  Surgeon: Gaynelle Arabian, MD;  Location: [facility];  Service: Orthopedics;  Laterality: Right;    There were no vitals filed for this visit.  Visit Diagnosis:  Orthopedic aftercare - Plan: PT plan of care cert/re-cert  Difficulty walking - Plan: PT plan of care cert/re-cert  Weakness - Plan: PT plan of care cert/re-cert      Subjective Assessment - 01/25/16 1049    Subjective Pt feels R anterior knee pain at times. Has not felt it last month. 2/10 R anterior knee pain currently at rest. Feels better when she moves it. Does her exercises including squats.    Pertinent History S/P R TKA 10/03/15 secondary to knee pain from arthritis. Prior to surgery, pt had a lot of difficulty walking and negotiating stairs due to her pain. Did not use AD but had difficulty. Knee feels better after surgery even though has pain. Participated in home health PT for 2 weeks which involved  knee flexion, extension exercises, ankle PF/DF  and walking.  Pt also adds that she picked up her 70 month old grand child (15-16 lbs) which caused increased swelling 10/20/15 . MD checked her knee out  and stated that pt broke up some sutures inside her knee but no ligaments or tendons were injured.    Patient Stated Goals "I want to get rid of this walker and cane. I just want to be able to walk and keep up with my family, feel good, and be strong (core strength).    Currently in Pain? Yes   Pain Score 2    Multiple Pain Sites No            [facility] PT Assessment - 01/25/16 1048    Observation/Other Assessments   Lower Extremity Functional Scale  58/80      Objectives  Manual therapy  Medial glide to R patella grade 3 (decreased stiffness)   There-ex  Pt education on stiffness, after surgery, importance of movement, as well as to not over work her knee with daily tasks.  Seated R knee flexion/extension multiple times  Sit <> stand from low mat table 10x with and without UE assist Then from chair 5x with and without UE  assist  ascending and descending 4 regular steps with bilateral UE assist 3x, then with R UE assist 1-2x CGA to SBA.  Reviewed progress with physical therapy with pt.   Improved exercise technique, movement at target joints, use of target muscles after mod verbal, visual, tactile cues.     Patient able to perform sit <> stand independently. Able to ascend and descend 4 regular steps multiple times with a step over step pattern (cues for bringing center of gravity over base of support). Unable to perform without UE assist secondary to L knee pain. Pt states she might get surgery for her L knee in September. Patient has done very well with physical therapy with her knee flexion, extension AROM, and function and has met almost all her goals. Skilled physical therapy services discharged with pt continuing progress with her home exercise program (after today's re-certification for follow-up/discharge appointment).                       PT Education - 01/25/16 1640    Education provided Yes   Education Details ther-ex, plan of care   Person(s) Educated Patient   Methods Demonstration;Explanation;Tactile cues;Verbal cues   Comprehension Verbalized understanding;Returned demonstration             PT Long Term Goals - 01/25/16 1641    PT LONG TERM GOAL #1   Title Patient will improve R knee extension AROM to 0 degrees to improve stance phase of gait   Time 6   Period Weeks   Status Achieved   PT LONG TERM GOAL #2   Title Patient will improve R knee flexion AROM to at least 115 degrees to promote ability to negotiate stairs to enter her home.    Baseline 116 degrees 12/27/2015   Time 6   Period Weeks   Status Achieved   PT LONG TERM GOAL #3   Title Patient will be able to ambulate with SPC modified independent for at least 500 ft to promote mobility   Baseline Able to ambulate community distances with no device currently.    Time 6   Period Weeks   Status Achieved   PT  LONG TERM GOAL #4   Title Patient will improve her LEFS score by at least 9 points as a demonstration of improved function.    Baseline 47/80 on 12/27/2015   Time 6   Period Weeks   Status Achieved  PT LONG TERM GOAL #5   Title Patient will report an LEFS score of at least 56/80 to demonstrate improved tolerance for ADLs.    Baseline 47/80 on 12/27/2015; 58/80 on 02/06/16   Time 2   Period Weeks   Status Achieved   PT LONG TERM GOAL #6   Title Patient will perform sit to stand from chair without use of UEs to demonstrate improved LE strength and reduced falls risk.    Baseline able to perform sit <> stand from chair without UE assist 02-06-2016   Time 2   Period Weeks   Status Achieved   PT LONG TERM GOAL #7   Title Patient will ascend/descend 4 steps in reciprocal fashion without use of UEs for balance to demonstrate improved dynamic balance and decreased falls risk.    Baseline Able to ascend/descend in reciprocal pattern with use of UEs. Unable to perform without UE assist secondary to L knee pain.    Time 2   Period Weeks   Status Not Met               Plan - 2016-02-06 1121    Clinical Impression Statement Patient able to perform sit <> stand independently. Able to ascend and descend 4 regular steps multiple times with a step over step pattern (cues for bringing center of gravity over base of support). Unable to perform without UE assist secondary to L knee pain. Pt states she might get surgery for her L knee in September. Patient has done very well with physical therapy with her knee flexion, extension AROM, and function and has met almost all her goals. Skilled physical therapy services discharged with pt continuing progress with her home exercise program (after today's re-certification for follow-up/discharge appointment).    Pt will benefit from skilled therapeutic intervention in order to improve on the following deficits Pain;Decreased strength;Decreased scar  mobility;Difficulty walking;Decreased range of motion;Abnormal gait   Rehab Potential Good   Clinical Impairments Affecting Rehab Potential No known clinical impairments affecting rehab potential.   PT Treatment/Interventions Therapeutic exercise;Manual techniques;Therapeutic activities;Electrical Stimulation;Gait training;Patient/family education;Neuromuscular re-education   PT Next Visit Plan Continue progress with her home exercise program.    Consulted and Agree with Plan of Care Patient          G-Codes - 02-06-16 1643    Functional Assessment Tool Used LEFS, patient interview, clinical presentation   Functional Limitation Mobility: Walking and moving around   Mobility: Walking and Moving Around Goal Status [phone]) At least 20 percent but less than 40 percent impaired, limited or restricted   Mobility: Walking and Moving Around Discharge Status [phone]) At least 20 percent but less than 40 percent impaired, limited or restricted      Problem List Patient Active Problem List   Diagnosis Date Noted  . OA (osteoarthritis) of knee 10/03/2015  . ETD (eustachian tube dysfunction) 09/16/2015  . Arthritis 06/30/2015  . H/O gastrointestinal disease 06/30/2015  . Depression, major, recurrent, moderate ([place]) 06/30/2015  . Aortic atherosclerosis ([place]) 02/08/2015  . Toenail deformity 02/08/2015  . Hyponatremia 01/04/2015  . Abdominal pain, right upper quadrant 11/24/2014  . Kidney cysts 05/25/2014  . Medicare annual wellness visit, subsequent 05/10/2014  . Screening for skin cancer 05/10/2014  . Tremor 05/10/2014  . GERD (gastroesophageal reflux disease) 02/05/2014  . Insomnia 01/01/2014  . Hot flashes 01/01/2014  . Glaucoma 03/12/2013  . Back pain 02/25/2013  . Depression 01/29/2013  . Allergic to latex 10/28/2012  . Bladder infection, chronic 10/28/2012  .  Cystocele, midline 10/28/2012  . Female genuine stress incontinence 10/28/2012  . Incomplete bladder emptying 10/28/2012  .  LBP (low back pain) 10/28/2012  . Neuralgia neuritis, sciatic nerve 10/28/2012  . Incomplete uterine prolapse 10/28/2012  . Urge incontinence 10/28/2012  . Screening for breast cancer 10/02/2012  . Hypertension 10/02/2012  . Osteoarthritis 01/10/2012  . Hyperlipidemia 10/24/2011  . Generalized anxiety disorder 10/24/2011   Thank you for your referral.  Joneen Boers PT, DPT   01/25/2016, 6:47 PM  [facility] [address] [place], [address] Phone: [phone]   Fax:  [phone]  Name: Michele Meyer MRN: [id number] Date of Birth: 1946-06-06

## 2016-02-07 ENCOUNTER — Ambulatory Visit (INDEPENDENT_AMBULATORY_CARE_PROVIDER_SITE_OTHER): Payer: 59 | Admitting: Psychiatry

## 2016-02-07 ENCOUNTER — Encounter: Payer: Self-pay | Admitting: Psychiatry

## 2016-02-07 VITALS — BP 124/70 | HR 80 | Temp 98.2°F | Ht 62.0 in | Wt 148.4 lb

## 2016-02-07 DIAGNOSIS — F32A Depression, unspecified: Secondary | ICD-10-CM | POA: Insufficient documentation

## 2016-02-07 DIAGNOSIS — F329 Major depressive disorder, single episode, unspecified: Secondary | ICD-10-CM | POA: Insufficient documentation

## 2016-02-07 DIAGNOSIS — F330 Major depressive disorder, recurrent, mild: Secondary | ICD-10-CM | POA: Diagnosis not present

## 2016-02-07 MED ORDER — ALPRAZOLAM 0.25 MG PO TABS: 0.2500 mg | ORAL_TABLET | Freq: Every evening | ORAL | Status: DC | PRN

## 2016-02-07 MED ORDER — BUSPIRONE HCL 5 MG PO TABS: 5.0000 mg | ORAL_TABLET | Freq: Two times a day (BID) | ORAL | Status: DC

## 2016-02-07 MED ORDER — SERTRALINE HCL 100 MG PO TABS: 100.0000 mg | ORAL_TABLET | Freq: Every day | ORAL | Status: DC
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
## 2016-02-09 ENCOUNTER — Telehealth: Payer: Self-pay | Admitting: Licensed Clinical Social Worker

## 2016-02-14 NOTE — Telephone Encounter (Signed)
Spoke to Patient about the CDIOP program in [place] for her son

## 2016-02-20 ENCOUNTER — Other Ambulatory Visit: Payer: Self-pay | Admitting: Family Medicine

## 2016-02-20 NOTE — Telephone Encounter (Signed)
Needs an appointment. Will get her enough medicine to make it to appointment when it's booked.   

## 2016-02-22 ENCOUNTER — Other Ambulatory Visit: Payer: Self-pay | Admitting: Family Medicine

## 2016-02-22 NOTE — Telephone Encounter (Signed)
Can we check on this? I don't want her to run out of meds, and I don't see anything scheduled. Thanks!

## 2016-02-27 ENCOUNTER — Encounter: Payer: Self-pay | Admitting: Family Medicine

## 2016-02-27 ENCOUNTER — Ambulatory Visit (INDEPENDENT_AMBULATORY_CARE_PROVIDER_SITE_OTHER): Payer: Medicare Other | Admitting: Family Medicine

## 2016-02-27 ENCOUNTER — Other Ambulatory Visit: Payer: Self-pay | Admitting: Family Medicine

## 2016-02-27 VITALS — BP 147/76 | HR 68 | Temp 97.9°F | Ht 61.6 in | Wt 148.0 lb

## 2016-02-27 DIAGNOSIS — I10 Essential (primary) hypertension: Secondary | ICD-10-CM

## 2016-02-27 DIAGNOSIS — N3020 Other chronic cystitis without hematuria: Secondary | ICD-10-CM

## 2016-02-27 DIAGNOSIS — M545 Low back pain, unspecified: Secondary | ICD-10-CM

## 2016-02-27 DIAGNOSIS — E538 Deficiency of other specified B group vitamins: Secondary | ICD-10-CM | POA: Diagnosis not present

## 2016-02-27 DIAGNOSIS — E785 Hyperlipidemia, unspecified: Secondary | ICD-10-CM

## 2016-02-27 DIAGNOSIS — Z13820 Encounter for screening for osteoporosis: Secondary | ICD-10-CM

## 2016-02-27 DIAGNOSIS — M1990 Unspecified osteoarthritis, unspecified site: Secondary | ICD-10-CM | POA: Diagnosis not present

## 2016-02-27 DIAGNOSIS — Z1239 Encounter for other screening for malignant neoplasm of breast: Secondary | ICD-10-CM | POA: Diagnosis not present

## 2016-02-27 MED ORDER — MELOXICAM 15 MG PO TABS: 15.0000 mg | ORAL_TABLET | Freq: Every day | ORAL | Status: DC

## 2016-02-27 NOTE — Assessment & Plan Note (Signed)
Slightly elevated. Has been under stress. Will continue current regimen and monitor at home. Work on [employer]. Recheck in 1 month.

## 2016-02-27 NOTE — Assessment & Plan Note (Signed)
HDL very high. Needs to be sent out. Await results.

## 2016-02-27 NOTE — Assessment & Plan Note (Signed)
Concerned about UTI. Would like to be checked, UA negative today.

## 2016-02-27 NOTE — Assessment & Plan Note (Signed)
Likely due to arthritis- see discussion. Exercises given today for back and neck.

## 2016-02-27 NOTE — Progress Notes (Signed)
BP 147/76 mmHg  Pulse 68  Temp(Src) 97.9 F (36.6 C)  Ht 5' 1.6" (1.565 m)  Wt 148 lb (67.132 kg)  BMI 27.41 kg/m2  SpO2 100%   Subjective:    Patient ID: Michele Meyer, female    DOB: 05-28-1946, 70 y.o.   MRN: [id number]  HPI: Michele Meyer is a 70 y.o. female  Chief Complaint  Patient presents with  . Hypertension  . Arthritis    Patient would like to be screened for RA  . Back Pain   HYPERTENSION Hypertension status: stable  Satisfied with current treatment? yes Duration of hypertension: chronic BP monitoring frequency:  not checking BP medication side effects:  no Medication compliance: excellent compliance Aspirin: no Recurrent headaches: no Visual changes: no Palpitations: no Dyspnea: no Chest pain: no Lower extremity edema: no Dizzy/lightheaded: no  HIP PAIN Duration: 2 weeks Involved hip: bilateral  Mechanism of injury: unknown Location: posterior on the SI joint Onset: sudden  Severity: 8/10  Quality: like a tooth ache Frequency: constant Radiation: no Aggravating factors: prolonged sitting   Alleviating factors: heating pads Status: stable Treatments attempted: rest and heat   Relief with NSAIDs?: No NSAIDs Taken Weakness with weight bearing: no Weakness with walking: no Paresthesias / decreased sensation: no Swelling: no Redness:no Fevers: no  Relevant past medical, surgical, family and social history reviewed and updated as indicated. Interim medical history since our last visit reviewed. Allergies and medications reviewed and updated.  Review of Systems  Constitutional: Negative.   Respiratory: Negative.   Cardiovascular: Negative.   Genitourinary: Positive for dysuria, frequency and flank pain. Negative for urgency, hematuria, decreased urine volume, vaginal bleeding, vaginal discharge, enuresis, difficulty urinating, genital sores, vaginal pain, menstrual problem, pelvic pain and dyspareunia.  Musculoskeletal: Positive for back  pain and joint swelling.  Psychiatric/Behavioral: Positive for dysphoric mood. The patient is nervous/anxious.   Per HPI unless specifically indicated above     Objective:    BP 147/76 mmHg  Pulse 68  Temp(Src) 97.9 F (36.6 C)  Ht 5' 1.6" (1.565 m)  Wt 148 lb (67.132 kg)  BMI 27.41 kg/m2  SpO2 100%  Wt Readings from Last 3 Encounters:  02/27/16 148 lb (67.132 kg)  02/07/16 148 lb 6.4 oz (67.314 kg)  10/03/15 150 lb (68.04 kg)    Physical Exam  Constitutional: She is oriented to person, place, and time. She appears well-developed and well-nourished. No distress.  HENT:  Head: Normocephalic and atraumatic.  Right Ear: Hearing normal.  Left Ear: Hearing normal.  Nose: Nose normal.  Eyes: Conjunctivae and lids are normal. Right eye exhibits no discharge. Left eye exhibits no discharge. No scleral icterus.  Cardiovascular: Normal rate, regular rhythm, normal heart sounds and intact distal pulses.  Exam reveals no gallop and no friction rub.   No murmur heard. Pulmonary/Chest: Effort normal and breath sounds normal. No respiratory distress. She has no wheezes. She has no rales. She exhibits no tenderness.  Musculoskeletal: She exhibits tenderness. She exhibits no edema.  Decreased ROM and tenderness to trap muscles in the neck. Tenderness to SI joints bilaterally with normal ROM, + arthritic changes on her hands bilaterally  Neurological: She is alert and oriented to person, place, and time.  Skin: Skin is intact. No rash noted. She is not diaphoretic.  Psychiatric: She has a normal mood and affect. Her speech is normal and behavior is normal. Judgment and thought content normal. Cognition and memory are normal.  Nursing note and vitals reviewed.  Results for orders placed or performed in visit on 10/13/15  Microscopic Examination  Result Value Ref Range   WBC, UA 0-5 0 -  5 /hpf   RBC, UA None seen 0 -  2 /hpf   Epithelial Cells (non renal) 0-10 0 - 10 /hpf   Renal Epithel,  UA None seen None seen /hpf   Casts None seen None seen /lpf   Cast Type None seen N/A   Crystals None seen N/A   Crystal Type None seen N/A   Mucus, UA None seen Not Estab.   Bacteria, UA Few (A) None seen/Few   Yeast, UA None seen None seen   Trichomonas, UA None seen None seen  UA/M w/rflx Culture, Routine  Result Value Ref Range   Specific Gravity, UA <1.005 (L) 1.005 - 1.030   pH, UA 6.5 5.0 - 7.5   Color, UA Yellow Yellow   Appearance Ur Clear Clear   Leukocytes, UA Negative Negative   Protein, UA Negative Negative/Trace   Glucose, UA Negative Negative   Ketones, UA Negative Negative   RBC, UA Trace (A) Negative   Bilirubin, UA Negative Negative   Urobilinogen, Ur 4.0 (H) 0.2 - 1.0 mg/dL   Nitrite, UA Negative Negative   Microscopic Examination See below:       Assessment & Plan:   Problem List Items Addressed This Visit      Cardiovascular and Mediastinum   Hypertension - Primary (Chronic)    Slightly elevated. Has been under stress. Will continue current regimen and monitor at home. Work on [employer]. Recheck in 1 month.         Musculoskeletal and Integument   Arthritis    Will check for tick diseases and RF/ANA- await results. Likely osteoarthritis. Will start meloxicam. Rx sent to her pharmacy. Recheck 1 month.       Relevant Medications   meloxicam (MOBIC) 15 MG tablet   Other Relevant Orders   Lyme Ab/Western Blot Reflex   Rocky mtn spotted fvr abs pnl(IgG+IgM)   Babesia microti Antibody Panel   Ehrlichia Antibody Panel   Antinuclear Antib (ANA)   Rheumatoid Factor     Genitourinary   Bladder infection, chronic    Concerned about UTI. Would like to be checked, UA negative today.        Other   Hyperlipidemia (Chronic)    HDL very high. Needs to be sent out. Await results.       Back pain   Relevant Medications   meloxicam (MOBIC) 15 MG tablet   Other Relevant Orders   UA/M w/rflx Culture, Routine   LBP (low back pain)    Likely due to  arthritis- see discussion. Exercises given today for back and neck.       Relevant Medications   meloxicam (MOBIC) 15 MG tablet   Other Relevant Orders   UA/M w/rflx Culture, Routine    Other Visit Diagnoses    Breast cancer screening        Mammogram ordered today    Screening for osteoporosis        DEXA ordered today.    Relevant Orders    DG Bone Density    Vitamin B 12 deficiency        History of, would like to be screened. CBC ordered today and was normal.     Relevant Orders    CBC With Differential/Platelet        Follow up plan: Return in about 4 weeks (  around 03/26/2016) for follow up arthritis and BP.

## 2016-02-27 NOTE — Assessment & Plan Note (Signed)
Will check for tick diseases and RF/ANA- await results. Likely osteoarthritis. Will start meloxicam. Rx sent to her pharmacy. Recheck 1 month.

## 2016-02-28 LAB — LIPID PANEL PICCOLO, WAIVED

## 2016-02-28 LAB — SPECIMEN STATUS REPORT

## 2016-02-29 LAB — EHRLICHIA ANTIBODY PANEL
E. CHAFFEENSIS (HME) IGM TITER: NEGATIVE
E.Chaffeensis (HME) IgG: NEGATIVE
HGE IGG TITER: NEGATIVE
HGE IGM TITER: NEGATIVE

## 2016-02-29 LAB — COMPREHENSIVE METABOLIC PANEL
A/G RATIO: 1.7 (ref 1.1–2.5)
ALK PHOS: 105 IU/L (ref 39–117)
ALT: 16 IU/L (ref 0–32)
AST: 17 IU/L (ref 0–40)
Albumin: 4.3 g/dL (ref 3.5–4.8)
BUN/Creatinine Ratio: 14 (ref 11–26)
BUN: 11 mg/dL (ref 8–27)
Bilirubin Total: 0.3 mg/dL (ref 0.0–1.2)
CHLORIDE: 87 mmol/L — AB (ref 96–106)
CO2: 27 mmol/L (ref 18–29)
Calcium: 9.4 mg/dL (ref 8.7–10.3)
Creatinine, Ser: 0.81 mg/dL (ref 0.57–1.00)
GFR calc Af Amer: 85 mL/min/{1.73_m2} (ref >59)
GFR calc non Af Amer: 73 mL/min/{1.73_m2} (ref >59)
GLOBULIN, TOTAL: 2.6 g/dL (ref 1.5–4.5)
Glucose: 87 mg/dL (ref 65–99)
POTASSIUM: 4.4 mmol/L (ref 3.5–5.2)
SODIUM: 129 mmol/L — AB (ref 134–144)
Total Protein: 6.9 g/dL (ref 6.0–8.5)

## 2016-02-29 LAB — RHEUMATOID FACTOR: RHEUMATOID FACTOR: 70.3 [IU]/mL — AB (ref 0.0–13.9)

## 2016-02-29 LAB — ROCKY MTN SPOTTED FVR ABS PNL(IGG+IGM)
RMSF IgG: NEGATIVE
RMSF IgM: 0.45 index (ref 0.00–0.89)

## 2016-02-29 LAB — LYME AB/WESTERN BLOT REFLEX

## 2016-02-29 LAB — ANA: ANA: NEGATIVE

## 2016-02-29 LAB — BABESIA MICROTI ANTIBODY PANEL
Babesia microti IgG: <1:10 {titer}
Babesia microti IgM: <1:10 {titer}

## 2016-03-01 ENCOUNTER — Telehealth: Payer: Self-pay | Admitting: Family Medicine

## 2016-03-01 DIAGNOSIS — R768 Other specified abnormal immunological findings in serum: Secondary | ICD-10-CM | POA: Insufficient documentation

## 2016-03-01 LAB — CBC WITH DIFFERENTIAL/PLATELET
HEMATOCRIT: 33.5 % — AB (ref 34.0–46.6)
Hemoglobin: 12.3 g/dL (ref 11.1–15.9)
LYMPHS: 13 %
Lymphocytes Absolute: 1 10*3/uL (ref 0.7–3.1)
MCH: 30 pg (ref 26.6–33.0)
MCHC: 37 g/dL — ABNORMAL HIGH (ref 31.5–35.7)
MCV: 81 fL (ref 79–97)
MID (ABSOLUTE): 1 10*3/uL (ref 0.1–1.6)
MID: 9 %
Neutrophils Absolute: 6 10*3/uL (ref 1.4–7.0)
Neutrophils: 78 %
Platelets: 246 10*3/uL (ref 150–379)
RBC: 4.15 x10E6/uL (ref 3.77–5.28)
RDW: 40 % — ABNORMAL HIGH (ref 12.3–15.4)
WBC: 7.1 10*3/uL (ref 3.4–10.8)

## 2016-03-01 LAB — UA/M W/RFLX CULTURE, ROUTINE
Bilirubin, UA: NEGATIVE
Glucose, UA: NEGATIVE
Ketones, UA: NEGATIVE
LEUKOCYTES UA: NEGATIVE
NITRITE UA: NEGATIVE
PH UA: 6 (ref 5.0–7.5)
Protein, UA: NEGATIVE
Specific Gravity, UA: 1.01 (ref 1.005–1.030)
Urobilinogen, Ur: 0.2 mg/dL (ref 0.2–1.0)

## 2016-03-01 LAB — MICROALBUMIN, URINE WAIVED
CREATININE, URINE WAIVED: 50 mg/dL (ref 10–300)
Microalb, Ur Waived: 10 mg/L (ref 0–19)

## 2016-03-01 NOTE — Telephone Encounter (Signed)
Pt called back and would like to speak to Dr Wynetta Emery.

## 2016-03-01 NOTE — Telephone Encounter (Signed)
Called to give Michele Meyer her results. Unable to leave a message. Her labs look good. She doesn't have any tick diseases, but it does look like one of the markers for arthritis is elevated. I would like her to see rheumatology. Referral put in. OK to give her this message if she calls.

## 2016-03-01 NOTE — Telephone Encounter (Signed)
Spoke to Michele Meyer about her results. She will see rheumatology. She is still having abdominal pain and not sure if it's coming from her back or her belly. Not sure if she should be checked for H. Pylori again. Will see how she does with rheum and if still in pain, will get her stool test as she has been positive before. Needs refills. Will contact the pharmacy and we will refill them for her.

## 2016-03-03 ENCOUNTER — Other Ambulatory Visit: Payer: Self-pay | Admitting: Family Medicine

## 2016-03-12 ENCOUNTER — Encounter: Payer: Self-pay | Admitting: Emergency Medicine

## 2016-03-12 ENCOUNTER — Ambulatory Visit
Admission: EM | Admit: 2016-03-12 | Discharge: 2016-03-12 | Disposition: A | Discharge status: Home / Self Care | Payer: Medicare Other | Attending: Emergency Medicine | Admitting: Emergency Medicine

## 2016-03-12 DIAGNOSIS — B029 Zoster without complications: Secondary | ICD-10-CM | POA: Diagnosis not present

## 2016-03-12 MED ORDER — FAMCICLOVIR 500 MG PO TABS: 500.0000 mg | ORAL_TABLET | Freq: Three times a day (TID) | ORAL | Status: DC

## 2016-03-12 MED ORDER — LIDOCAINE 5 % EX OINT: 1.0000 "application " | TOPICAL_OINTMENT | Freq: Three times a day (TID) | CUTANEOUS | Status: DC | PRN

## 2016-03-12 MED ORDER — HYDROCODONE-ACETAMINOPHEN 5-325 MG PO TABS: 1.0000 | ORAL_TABLET | Freq: Four times a day (QID) | ORAL | Status: DC | PRN

## 2016-03-12 NOTE — Discharge Instructions (Signed)
Shingles °Shingles, which is also known as herpes zoster, is an infection that causes a painful skin rash and fluid-filled blisters. Shingles is not related to genital herpes, which is a sexually transmitted infection.  ° °Shingles only develops in people who: °· Have had chickenpox. °· Have received the chickenpox vaccine. (This is rare.) °CAUSES °Shingles is caused by varicella-zoster virus (VZV). This is the same virus that causes chickenpox. After exposure to VZV, the virus stays in the body in an inactive (dormant) state. Shingles develops if the virus reactivates. This can happen many years after the initial exposure to VZV. It is not known what causes this virus to reactivate. °RISK FACTORS °People who have had chickenpox or received the chickenpox vaccine are at risk for shingles. Infection is more common in people who: °· Are older than age 50. °· Have a weakened defense (immune) system, such as those with HIV, AIDS, or cancer. °· Are taking medicines that weaken the immune system, such as transplant medicines. °· Are under great stress. °SYMPTOMS °Early symptoms of this condition include itching, tingling, and pain in an area on your skin. Pain may be described as burning, stabbing, or throbbing. °A few days or weeks after symptoms start, a painful red rash appears, usually on one side of the body in a bandlike or beltlike pattern. The rash eventually turns into fluid-filled blisters that break open, scab over, and dry up in about 2-3 weeks. °At any time during the infection, you may also develop: °· A fever. °· Chills. °· A headache. °· An upset stomach. °DIAGNOSIS °This condition is diagnosed with a skin exam. Sometimes, skin or fluid samples are taken from the blisters before a diagnosis is made. These samples are examined under a microscope or sent to a lab for testing. °TREATMENT °There is no specific cure for this condition. Your health care provider will probably prescribe medicines to help you  manage pain, recover more quickly, and avoid long-term problems. Medicines may include: °· Antiviral drugs. °· Anti-inflammatory drugs. °· Pain medicines. °If the area involved is on your face, you may be referred to a specialist, such as an eye doctor (ophthalmologist) or an ear, nose, and throat (ENT) doctor to help you avoid eye problems, chronic pain, or disability. °HOME CARE INSTRUCTIONS °Medicines °· Take medicines only as directed by your health care provider. °· Apply an anti-itch or numbing cream to the affected area as directed by your health care provider. °Blister and Rash Care °· Take a cool bath or apply cool compresses to the area of the rash or blisters as directed by your health care provider. This may help with pain and itching. °· Keep your rash covered with a loose bandage (dressing). Wear loose-fitting clothing to help ease the pain of material rubbing against the rash. °· Keep your rash and blisters clean with mild soap and cool water or as directed by your health care provider. °· Check your rash every day for signs of infection. These include redness, swelling, and pain that lasts or increases. °· Do not pick your blisters. °· Do not scratch your rash. °General Instructions °· Rest as directed by your health care provider. °· Keep all follow-up visits as directed by your health care provider. This is important. °· Until your blisters scab over, your infection can cause chickenpox in people who have never had it or been vaccinated against it. To prevent this from happening, avoid contact with other people, especially: °· Babies. °· Pregnant women. °· Children   who have eczema. °· Elderly people who have transplants. °· People who have chronic illnesses, such as leukemia or AIDS. °SEEK MEDICAL CARE IF: °· Your pain is not relieved with prescribed medicines. °· Your pain does not get better after the rash heals. °· Your rash looks infected. Signs of infection include redness, swelling, and pain  that lasts or increases. °SEEK IMMEDIATE MEDICAL CARE IF: °· The rash is on your face or nose. °· You have facial pain, pain around your eye area, or loss of feeling on one side of your face. °· You have ear pain or you have ringing in your ear. °· You have loss of taste. °· Your condition gets worse. °  °This information is not intended to replace advice given to you by your health care provider. Make sure you discuss any questions you have with your health care provider. °  °Document Released: 12/10/2005 Document Revised: 12/31/2014 Document Reviewed: 10/21/2014 °Elsevier Interactive Patient Education ©2016 [employer] ° °Neuropathic Pain °Neuropathic pain is pain caused by damage to the nerves that are responsible for certain sensations in your body (sensory nerves). The pain can be caused by damage to:  °· The sensory nerves that send signals to your spinal cord and brain (peripheral nervous system). °· The sensory nerves in your brain or spinal cord (central nervous system). °Neuropathic pain can make you more sensitive to pain. What would be a minor sensation for most people may feel very painful if you have neuropathic pain. This is usually a long-term condition that can be difficult to treat. The type of pain can differ from person to person. It may start suddenly (acute), or it may develop slowly and last for a long time (chronic). Neuropathic pain may come and go as damaged nerves heal or may stay at the same level for years. It often causes emotional distress, loss of sleep, and a lower quality of life. °CAUSES  °The most common cause of damage to a sensory nerve is diabetes. Many other diseases and conditions can also cause neuropathic pain. Causes of neuropathic pain can be classified as: °· Toxic. Many drugs and chemicals can cause toxic damage. The most common cause of toxic neuropathic pain is damage from drug treatment for cancer (chemotherapy). °· Metabolic. This type of pain can happen when a  disease causes imbalances that damage nerves. Diabetes is the most common of these diseases. Vitamin B deficiency caused by long-term alcohol abuse is another common cause. °· Traumatic. Any injury that cuts, crushes, or stretches a nerve can cause damage and pain. A common example is feeling pain after losing an arm or leg (phantom limb pain). °· Compression-related. If a sensory nerve gets trapped or compressed for a long period of time, the blood supply to the nerve can be cut off. °· Vascular. Many blood vessel diseases can cause neuropathic pain by decreasing blood supply and oxygen to nerves. °· Autoimmune. This type of pain results from diseases in which the body's defense system mistakenly attacks sensory nerves. Examples of autoimmune diseases that can cause neuropathic pain include lupus and multiple sclerosis. °· Infectious. Many types of viral infections can damage sensory nerves and cause pain. Shingles infection is a common cause of this type of pain. °· Inherited. Neuropathic pain can be a symptom of many diseases that are passed down through families (genetic). °SIGNS AND SYMPTOMS  °The main symptom is pain. Neuropathic pain is often described as: °· Burning. °· Shock-like. °· Stinging. °· Hot or cold. °·   Itching. °DIAGNOSIS  °No single test can diagnose neuropathic pain. Your health care provider will do a physical exam and ask you about your pain. You may use a pain scale to describe how bad your pain is. You may also have tests to see if you have a high sensitivity to pain and to help find the cause and location of any sensory nerve damage. These tests may include: °· Imaging studies, such as: °¨ X-rays. °¨ CT scan. °¨ MRI. °· Nerve conduction studies to test how well nerve signals travel through your sensory nerves (electrodiagnostic testing). °· Stimulating your sensory nerves through electrodes on your skin and measuring the response in your spinal cord and brain (somatosensory evoked  potentials). °TREATMENT  °Treatment for neuropathic pain may change over time. You may need to try different treatment options or a combination of treatments. Some options include: °· Over-the-counter pain relievers. °· Prescription medicines. Some medicines used to treat other conditions may also help neuropathic pain. These include medicines to: °¨ Control seizures (anticonvulsants). °¨ Relieve depression (antidepressants). °· Prescription-strength pain relievers (narcotics). These are usually used when other pain relievers do not help. °· Transcutaneous nerve stimulation (TENS). This uses electrical currents to block painful nerve signals. The treatment is painless. °· Topical and local anesthetics. These are medicines that numb the nerves. They can be injected as a nerve block or applied to the skin. °· Alternative treatments, such as: °¨ Acupuncture. °¨ Meditation. °¨ Massage. °¨ Physical therapy. °¨ Pain management programs. °¨ Counseling. °HOME CARE INSTRUCTIONS °· Learn as much as you can about your condition. °· Take medicines only as directed by your health care provider. °· Work closely with all your health care providers to find what works best for you. °· Have a good support system at home. °· Consider joining a chronic pain support group. °SEEK MEDICAL CARE IF: °· Your pain treatments are not helping. °· You are having side effects from your medicines. °· You are struggling with fatigue, mood changes, depression, or anxiety. °  °This information is not intended to replace advice given to you by your health care provider. Make sure you discuss any questions you have with your health care provider. °  °Document Released: 09/06/2004 Document Revised: 12/31/2014 Document Reviewed: 05/20/2014 °Elsevier Interactive Patient Education ©2016 [employer] ° °

## 2016-03-12 NOTE — ED Notes (Signed)
Patient states she has a shingles like rash on her left upper leg

## 2016-03-12 NOTE — ED Provider Notes (Signed)
CSN: [id number]     Arrival date & time 03/12/16  1307 History   First MD Initiated Contact with Patient 03/12/16 1603     Chief Complaint  Patient presents with  . Herpes Zoster   (Consider location/radiation/quality/duration/timing/severity/associated sxs/prior Treatment) HPI  70 year old female who presents with a burning rash that occurred on Saturday her left upper leg into the groin. She states that she had one other episode of possible shingles that did not burn nearly as bad or as painful as this episode. SHe had the chickenpox as a child.       Past Medical History  Diagnosis Date  . Anxiety   . Cystocele   . Incomplete bladder emptying   . Chronic cystitis   . Stress incontinence   . Depression   . UTI (lower urinary tract infection)   . Skin cancer   . Arthritis   . Endometriosis   . Glaucoma     both eyes  . Uterovaginal prolapse, incomplete   . Skin cancer     basal and squamous cell  . Labile hypertension   . Dyslipidemia   . Cyst of right kidney   . Diverticulosis   . Gestational diabetes mellitus 30 years ago    with pregnancy   . Bladder filling defect     bladder sling protrusing last 3 years   . PONV (postoperative nausea and vomiting)    Past Surgical History  Procedure Laterality Date  . Removal of first rib      bilaterally  . Prolapsed bladder      Repair Dr.Cope  . Vein ligation and stripping Bilateral   . Breast biopsy    . Tonsillectomy    . Appendectomy    . Breast lumpectomy      benign  . Pubovaginal sling  4 years ago    protrusion of bladder sling for last 3 years  . Anterior and posterior vaginal repair    . Abdominal hysterectomy      complete  . Total knee arthroplasty Right 10/03/2015    Procedure: RIGHT TOTAL KNEE ARTHROPLASTY;  Surgeon: Gaynelle Arabian, MD;  Location: [facility];  Service: Orthopedics;  Laterality: Right;   Family History  Problem Relation Age of Onset  . Diabetes Mother   . Hypertension Mother   .  Cervical cancer Mother   . Skin cancer Mother   . Hypercholesterolemia Mother   . Heart disease Mother   . Kidney disease Mother   . Depression Father   . Hypertension Father   . Hypercholesterolemia Father   . Colon cancer Neg Hx    Social History  Substance Use Topics  . Smoking status: Former Smoker    Quit date: 02/25/1964  . Smokeless tobacco: Never Used     Comment: smoked for two months  . Alcohol Use: Yes     Comment: rarely   OB History    No data available     Review of Systems  Constitutional: Positive for activity change. Negative for fever, chills and fatigue.  Skin: Positive for rash.  All other systems reviewed and are negative.   Allergies  Ace inhibitors; Codeine; Erythromycin; Prednisone; Latex; and Penicillins  Home Medications   Prior to Admission medications   Medication Sig Start Date End Date Taking? Authorizing Provider  ALPRAZolam (XANAX) 0.25 MG tablet Take 1 tablet (0.25 mg total) by mouth at bedtime as needed for anxiety. 02/07/16   Rainey Pines, MD  famciclovir (FAMVIR) 500 MG tablet  Take 1 tablet (500 mg total) by mouth 3 (three) times daily. 03/12/16   Lorin Picket, PA-C  fluticasone (FLONASE) 50 MCG/ACT nasal spray Place 2 sprays into both nostrils daily. 09/16/15   Megan P Johnson, DO  HYDROcodone-acetaminophen (NORCO/VICODIN) 5-325 MG tablet Take 1-2 tablets by mouth every 6 (six) hours as needed. 03/12/16   Lorin Picket, PA-C  latanoprost (XALATAN) 0.005 % ophthalmic solution Place 1 drop into both eyes at bedtime.  03/10/13   Historical Provider, MD  lidocaine (XYLOCAINE) 5 % ointment Apply 1 application topically 3 (three) times daily as needed. 03/12/16   Lorin Picket, PA-C  meloxicam (MOBIC) 15 MG tablet Take 1 tablet (15 mg total) by mouth daily. 02/27/16   Megan P Johnson, DO  metoprolol succinate (TOPROL-XL) 25 MG 24 hr tablet Take 0.5 tablets (12.5 mg total) by mouth every 12 (twelve) hours. 11/24/15   Megan P Johnson, DO   nystatin (MYCOSTATIN) powder APPLY TO AFFECTED AREA 3 TIMES DAILY 03/05/16   Megan P Johnson, DO  pantoprazole (PROTONIX) 20 MG tablet Take 2 tablets (40 mg total) by mouth daily. 09/15/15   Megan P Johnson, DO  sertraline (ZOLOFT) 100 MG tablet Take 1 tablet (100 mg total) by mouth daily. Patient taking differently: Take 50 mg by mouth 2 (two) times daily.  02/07/16   Rainey Pines, MD  temazepam (RESTORIL) 15 MG capsule  11/08/15   Historical Provider, MD  valsartan-hydrochlorothiazide (DIOVAN-HCT) 160-25 MG tablet TAKE 1 TABLET BY MOUTH DAILY. 02/22/16   Megan P Johnson, DO   Meds Ordered and Administered this Visit  Medications - No data to display  BP 149/59 mmHg  Pulse 72  Temp(Src) 98.2 F (36.8 C) (Oral)  Resp 18  Ht 5\' 3"  (1.6 m)  Wt 147 lb (66.679 kg)  BMI 26.05 kg/m2  SpO2 100% No data found.   Physical Exam  Constitutional: She is oriented to person, place, and time. She appears well-developed and well-nourished. No distress.  HENT:  Head: Normocephalic and atraumatic.  Eyes: Conjunctivae are normal. Pupils are equal, round, and reactive to light.  Neck: Normal range of motion. Neck supple.  Musculoskeletal: Normal range of motion. She exhibits no edema or tenderness.  Neurological: She is alert and oriented to person, place, and time.  Skin: Rash noted. She is not diaphoretic. There is erythema.  Examination of the left upper medial thigh is a vesicular erythematous patchy rash restricted to the dermatome. Is no drainage currently from the venous vesicles themselves. Chaperoning was performed by Izora Gala, RN  Psychiatric: She has a normal mood and affect. Her behavior is normal. Judgment and thought content normal.  Nursing note and vitals reviewed.   ED Course  Procedures (including critical care time)  Labs Review Labs Reviewed - No data to display  Imaging Review No results found.   Visual Acuity Review  Right Eye Distance:   Left Eye Distance:   Bilateral  Distance:    Right Eye Near:   Left Eye Near:    Bilateral Near:         MDM   1. Herpes zoster    Discharge Medication List as of 03/12/2016  4:40 PM    START taking these medications   Details  famciclovir (FAMVIR) 500 MG tablet Take 1 tablet (500 mg total) by mouth 3 (three) times daily., Starting 03/12/2016, Until Discontinued, Print    HYDROcodone-acetaminophen (NORCO/VICODIN) 5-325 MG tablet Take 1-2 tablets by mouth every 6 (six) hours as needed.,  Starting 03/12/2016, Until Discontinued, Print    lidocaine (XYLOCAINE) 5 % ointment Apply 1 application topically 3 (three) times daily as needed., Starting 03/12/2016, Until Discontinued, Print      Plan: 1. Test/x-ray results and diagnosis reviewed with patient 2. rx as per orders; risks, benefits, potential side effects reviewed with patient 3. Recommend supportive treatment with very judicious use of the lidocaine ointment. She follow-up with her primary care next week or the following week. She does have an appointment with her rheumatologist on Wednesday because of a R A workup. I cautioned her about handling her grandchild so as not to inoculate her with chickenpox 4. F/u prn if symptoms worsen or don't improve     Lorin Picket, PA-C 03/12/16 1648

## 2016-03-13 ENCOUNTER — Telehealth: Payer: Self-pay | Admitting: Family Medicine

## 2016-03-13 MED ORDER — FAMCICLOVIR 500 MG PO TABS: 500.0000 mg | ORAL_TABLET | Freq: Three times a day (TID) | ORAL | Status: DC

## 2016-03-13 NOTE — Telephone Encounter (Signed)
Forward to provider

## 2016-03-13 NOTE — Telephone Encounter (Signed)
Pt's daughter in law called stating the housekeeper must have thrown out her prescription, Fanciclovir 500mg  and is asking Dr Wynetta Emery to call in for her.  I explained they should speak with the provider who wrote the prescription but they stated they cannot get through to [facility].  I did explain this is not normally how this is handled but they wanted me to message Dr Wynetta Emery and ask.  Please call pt to advise.

## 2016-03-13 NOTE — Telephone Encounter (Signed)
Refill sent to her pharmacy

## 2016-03-14 DIAGNOSIS — B028 Zoster with other complications: Secondary | ICD-10-CM | POA: Insufficient documentation

## 2016-03-28 DIAGNOSIS — M5136 Other intervertebral disc degeneration, lumbar region: Secondary | ICD-10-CM | POA: Insufficient documentation

## 2016-03-29 ENCOUNTER — Ambulatory Visit (INDEPENDENT_AMBULATORY_CARE_PROVIDER_SITE_OTHER): Payer: Medicare Other | Admitting: Family Medicine

## 2016-03-29 ENCOUNTER — Encounter: Payer: Self-pay | Admitting: Family Medicine

## 2016-03-29 VITALS — BP 144/75 | HR 69 | Temp 98.5°F | Ht 61.2 in | Wt 146.0 lb

## 2016-03-29 DIAGNOSIS — B0229 Other postherpetic nervous system involvement: Secondary | ICD-10-CM

## 2016-03-29 DIAGNOSIS — I10 Essential (primary) hypertension: Secondary | ICD-10-CM

## 2016-03-29 DIAGNOSIS — H9193 Unspecified hearing loss, bilateral: Secondary | ICD-10-CM | POA: Diagnosis not present

## 2016-03-29 DIAGNOSIS — M170 Bilateral primary osteoarthritis of knee: Secondary | ICD-10-CM

## 2016-03-29 DIAGNOSIS — H9190 Unspecified hearing loss, unspecified ear: Secondary | ICD-10-CM | POA: Insufficient documentation

## 2016-03-29 DIAGNOSIS — D229 Melanocytic nevi, unspecified: Secondary | ICD-10-CM

## 2016-03-29 MED ORDER — AMITRIPTYLINE HCL 25 MG PO TABS: 25.0000 mg | ORAL_TABLET | Freq: Every day | ORAL | Status: DC

## 2016-03-29 NOTE — Assessment & Plan Note (Signed)
Stable for age. Encouraged her to take her medication daily. Call with any problems.

## 2016-03-29 NOTE — Assessment & Plan Note (Signed)
Did not do well on her hearing test today. Will refer her to audiometry for evaluation and possible hearing aides. Call with any concerns.

## 2016-03-29 NOTE — Progress Notes (Signed)
BP 144/75 mmHg  Pulse 69  Temp(Src) 98.5 F (36.9 C)  Ht 5' 1.2" (1.554 m)  Wt 146 lb (66.225 kg)  BMI 27.42 kg/m2  SpO2 100%   Subjective:    Patient ID: Michele Meyer, female    DOB: [birth year], 70 y.o.   MRN: [id number]  HPI: Michele Meyer is a 70 y.o. female  Chief Complaint  Patient presents with  . Arthritis  . Hypertension  . Herpes Zoster    Patient was diagnosed with shingles at Urgent Care   3/20 went to ER with rash and diagnosed with shingles. Started on medication after a couple of days due to a lost script. Is now resolved without isses  Saw Rheumatology 3/22 and yesterday- they suspected OA and did additional blood work including hep B and C, anti-ccp and serum protein electrophoresis. X-rays of her hands and lumbar spine showed CMC and DIP arthritis. She was offered injections, which she declined. SI joints showed arthritis with facet joint involvement. She was given exercises and recommended tylenol and bengay/asper cream as she doesn't want to use the meloxicam.  For her low back she was offered an epidural steroid injection through physiatry if she is interested. She will follow up with them in 6 months if needed.   HYPERTENSION- did not take her medicine today Hypertension status: stable  Satisfied with current treatment? yes Duration of hypertension: chronic BP monitoring frequency:  not checking BP medication side effects:  no Medication compliance: good compliance Aspirin: no Recurrent headaches: no Visual changes: no Palpitations: no Dyspnea: no Chest pain: no Lower extremity edema: no Dizzy/lightheaded: no   EAG CLOGGED Duration: about a year Involved ear(s): yes bilateral Sensation of feeling clogged/plugged: yes Decreased/muffled hearing:yes Ear pain: no Fever: no Otorrhea: no Hearing loss: yes Upper respiratory infection symptoms: no Using Q-Tips: no Status: worse History of cerumenosis: no Treatments attempted: none  SKIN  LESIONS, numerous >100 moles on chest and back and arms Duration: chronic Location: all over Painful: no Itching: no Onset: gradual Context: not changing Associated signs and symptoms: None History of skin cancer: yes History of precancerous skin lesions: yes  Relevant past medical, surgical, family and social history reviewed and updated as indicated. Interim medical history since our last visit reviewed. Allergies and medications reviewed and updated.  Review of Systems  Constitutional: Negative.   HENT: Positive for hearing loss. Negative for congestion, dental problem, drooling, ear discharge, ear pain, facial swelling, mouth sores, nosebleeds, postnasal drip, rhinorrhea, sinus pressure, sneezing, sore throat, tinnitus, trouble swallowing and voice change.   Respiratory: Negative.   Cardiovascular: Negative.   Psychiatric/Behavioral: Negative.     Per HPI unless specifically indicated above     Objective:    BP 144/75 mmHg  Pulse 69  Temp(Src) 98.5 F (36.9 C)  Ht 5' 1.2" (1.554 m)  Wt 146 lb (66.225 kg)  BMI 27.42 kg/m2  SpO2 100%  Wt Readings from Last 3 Encounters:  03/29/16 146 lb (66.225 kg)  03/12/16 147 lb (66.679 kg)  02/27/16 148 lb (67.132 kg)    Physical Exam  Constitutional: She is oriented to person, place, and time. She appears well-developed and well-nourished. No distress.  HENT:  Head: Normocephalic and atraumatic.  Right Ear: Hearing, tympanic membrane, external ear and ear canal normal.  Left Ear: Hearing, tympanic membrane, external ear and ear canal normal.  Nose: Nose normal.  Mouth/Throat: Oropharynx is clear and moist. No oropharyngeal exudate.  Eyes: Conjunctivae and lids are normal. Right  eye exhibits no discharge. Left eye exhibits no discharge. No scleral icterus.  Cardiovascular: Normal rate, regular rhythm, normal heart sounds and intact distal pulses.  Exam reveals no gallop and no friction rub.   No murmur heard. Pulmonary/Chest:  Effort normal and breath sounds normal. No respiratory distress. She has no wheezes. She has no rales. She exhibits no tenderness.  Musculoskeletal: Normal range of motion.  Neurological: She is alert and oriented to person, place, and time.  Skin: Skin is warm, dry and intact. No rash noted. She is not diaphoretic. No erythema. No pallor.  >100 nevi on chest, arms and back  Psychiatric: She has a normal mood and affect. Her speech is normal and behavior is normal. Judgment and thought content normal. Cognition and memory are normal.  Nursing note and vitals reviewed.   Results for orders placed or performed in visit on 02/27/16  Microalbumin, Urine Waived  Result Value Ref Range   Microalb, Ur Waived 10 0 - 19 mg/L   Creatinine, Urine Waived 50 10 - 300 mg/dL   Microalb/Creat Ratio <30 <30 mg/g  Comprehensive metabolic panel  Result Value Ref Range   Glucose 87 65 - 99 mg/dL   BUN 11 8 - 27 mg/dL   Creatinine, Ser 0.81 0.57 - 1.00 mg/dL   GFR calc non Af Amer 73 >59 mL/min/1.73   GFR calc Af Amer 85 >59 mL/min/1.73   BUN/Creatinine Ratio 14 11 - 26   Sodium 129 (L) 134 - 144 mmol/L   Potassium 4.4 3.5 - 5.2 mmol/L   Chloride 87 (L) 96 - 106 mmol/L   CO2 27 18 - 29 mmol/L   Calcium 9.4 8.7 - 10.3 mg/dL   Total Protein 6.9 6.0 - 8.5 g/dL   Albumin 4.3 3.5 - 4.8 g/dL   Globulin, Total 2.6 1.5 - 4.5 g/dL   Albumin/Globulin Ratio 1.7 1.1 - 2.5   Bilirubin Total 0.3 0.0 - 1.2 mg/dL   Alkaline Phosphatase 105 39 - 117 IU/L   AST 17 0 - 40 IU/L   ALT 16 0 - 32 IU/L  Lipid Panel Piccolo, Waived  Result Value Ref Range   Cholesterol Piccolo, Waived WILL FOLLOW    HDL Chol Piccolo, Waived CANCELED    Triglycerides Piccolo,Waived CANCELED   UA/M w/rflx Culture, Routine  Result Value Ref Range   Specific Gravity, UA 1.010 1.005 - 1.030   pH, UA 6.0 5.0 - 7.5   Color, UA Yellow Yellow   Appearance Ur Clear Clear   Leukocytes, UA Negative Negative   Protein, UA Negative  Negative/Trace   Glucose, UA Negative Negative   Ketones, UA Negative Negative   RBC, UA Trace (A) Negative   Bilirubin, UA Negative Negative   Urobilinogen, Ur 0.2 0.2 - 1.0 mg/dL   Nitrite, UA Negative Negative  Lyme Ab/Western Blot Reflex  Result Value Ref Range   Lyme IgG/IgM Ab <0.91 0.00 - 0.90 ISR   LYME DISEASE AB, QUANT, IGM <0.80 0.00 - 0.79 index  Rocky mtn spotted fvr abs pnl(IgG+IgM)  Result Value Ref Range   RMSF IgG Negative Negative   RMSF IgM 0.45 0.00 - 0.89 index  Babesia microti Antibody Panel  Result Value Ref Range   Babesia microti IgM <1:10 Neg:<1:10   Babesia microti IgG [id number]  Ehrlichia Antibody Panel  Result Value Ref Range   E.Chaffeensis (HME) IgG Negative Neg:<1:64   E. Chaffeensis (HME) IgM Titer Negative Neg:<1:20   HGE IgG Titer Negative Neg:<1:64  HGE IgM Titer Negative Neg:<1:20  Antinuclear Antib (ANA)  Result Value Ref Range   Anit Nuclear Antibody(ANA) Negative Negative  Rheumatoid Factor  Result Value Ref Range   Rhuematoid fact SerPl-aCnc 70.3 (H) 0.0 - 13.9 IU/mL  CBC With Differential/Platelet  Result Value Ref Range   WBC 7.1 3.4 - 10.8 x10E3/uL   RBC 4.15 3.77 - 5.28 x10E6/uL   Hemoglobin 12.3 11.1 - 15.9 g/dL   Hematocrit 33.5 (L) 34.0 - 46.6 %   MCV 81 79 - 97 fL   MCH 30.0 26.6 - 33.0 pg   MCHC 37.0 (H) 31.5 - 35.7 g/dL   RDW 40.0 (H) 12.3 - 15.4 %   Platelets 246 150 - 379 x10E3/uL   Neutrophils 78 %   Lymphs 13 %   MID 9 %   Neutrophils Absolute 6.0 1.4 - 7.0 x10E3/uL   Lymphocytes Absolute 1.0 0.7 - 3.1 x10E3/uL   MID (Absolute) 1.0 0.1 - 1.6 X10E3/uL  Specimen status report  Result Value Ref Range   specimen status report Comment       Assessment & Plan:   Problem List Items Addressed This Visit      Cardiovascular and Mediastinum   Hypertension (Chronic)    Stable for age. Encouraged her to take her medication daily. Call with any problems.         Nervous and Auditory   Hearing loss    Did  not do well on her hearing test today. Will refer her to audiometry for evaluation and possible hearing aides. Call with any concerns.       Relevant Orders   Ambulatory referral to ENT     Musculoskeletal and Integument   Osteoarthritis    Continue to monitor. She will let us know if she would like to pursue shots. Call with any problems.       Relevant Medications   traMADol (ULTRAM) 50 MG tablet    Other Visit Diagnoses    Post herpetic neuralgia    -  Primary    Will treat with amitriptyline. Call with any concerns.     Relevant Medications    amitriptyline (ELAVIL) 25 MG tablet    Nevus        >100 nevi on her chest, history of skin cancer. Will send to dermatology. Referral made today    Relevant Orders    Ambulatory referral to Dermatology        Follow up plan: Return in about 3 months (around 06/28/2016).

## 2016-03-29 NOTE — Assessment & Plan Note (Signed)
Continue to monitor. She will let us know if she would like to pursue shots. Call with any problems.

## 2016-04-05 ENCOUNTER — Encounter: Payer: Self-pay | Admitting: Psychiatry

## 2016-04-05 ENCOUNTER — Ambulatory Visit (INDEPENDENT_AMBULATORY_CARE_PROVIDER_SITE_OTHER): Payer: 59 | Admitting: Psychiatry

## 2016-04-05 VITALS — BP 122/70 | HR 75 | Temp 98.1°F | Ht 61.0 in | Wt 148.0 lb

## 2016-04-05 DIAGNOSIS — F411 Generalized anxiety disorder: Secondary | ICD-10-CM | POA: Diagnosis not present

## 2016-04-05 DIAGNOSIS — F331 Major depressive disorder, recurrent, moderate: Secondary | ICD-10-CM

## 2016-04-05 MED ORDER — TRAZODONE HCL 50 MG PO TABS: 50.0000 mg | ORAL_TABLET | Freq: Every day | ORAL | Status: DC

## 2016-04-05 MED ORDER — SERTRALINE HCL 100 MG PO TABS: 100.0000 mg | ORAL_TABLET | Freq: Every day | ORAL | Status: DC

## 2016-04-05 NOTE — Progress Notes (Signed)
[facility] MD/PA/NP OP Progress Note  04/05/2016 10:31 AM Michele Meyer  MRN:  [id number]  Subjective:    Patient is  a 70 year old married female who presented for the follow-up appointment. She reported that she is currently experiencing issues with her son and daughter-in-law who are living on her property. Patient reported that her 4 year old husband continues to work on a daily basis. She is very much concerned about him. She reported that her son does not work and they aren't getting financial support from her husband. She is concerned about the health of her husband. She reported that her daughter-in-law came this morning and was asking for money for many Cigarettes. She reported that she wants her husband to stop working and the son to start working but he is not getting the same. She was worried about the health of her husband. Patient reported that she has tried to ask herself multiple times but he does not understand. We discussed in detail about the same.  Patient stated that she stop the Restoril after 2 days as she was not feeling well on the medication. She was also given amitriptyline by her primary care physician but she did not respond well to the medication. She is concerned about the use of sleeping agent as her mother has history of dementia. She is willing to try trazodone at this time. She currently denied having any worsening of her mood symptoms anxiety or paranoia. She denied having any suicidal ideations or plans.   Chief Complaint:  Chief Complaint    Follow-up; Medication Refill     Visit Diagnosis:     ICD-9-CM ICD-10-CM   1. MDD (major depressive disorder), recurrent episode, moderate (HCC) 296.32 F33.1   2. GAD (generalized anxiety disorder) 300.02 F41.1     Past Medical History:  Past Medical History  Diagnosis Date  . Anxiety   . Cystocele   . Incomplete bladder emptying   . Chronic cystitis   . Stress incontinence   . Depression   . UTI (lower urinary tract  infection)   . Skin cancer   . Arthritis   . Endometriosis   . Glaucoma     both eyes  . Uterovaginal prolapse, incomplete   . Skin cancer     basal and squamous cell  . Labile hypertension   . Dyslipidemia   . Cyst of right kidney   . Diverticulosis   . Gestational diabetes mellitus 30 years ago    with pregnancy   . Bladder filling defect     bladder sling protrusing last 3 years   . PONV (postoperative nausea and vomiting)     Past Surgical History  Procedure Laterality Date  . Removal of first rib      bilaterally  . Prolapsed bladder      Repair Dr.Cope  . Vein ligation and stripping Bilateral   . Breast biopsy    . Tonsillectomy    . Appendectomy    . Breast lumpectomy      benign  . Pubovaginal sling  4 years ago    protrusion of bladder sling for last 3 years  . Anterior and posterior vaginal repair    . Abdominal hysterectomy      complete  . Total knee arthroplasty Right 10/03/2015    Procedure: RIGHT TOTAL KNEE ARTHROPLASTY;  Surgeon: Gaynelle Arabian, MD;  Location: [facility];  Service: Orthopedics;  Laterality: Right;   Family History:  Family History  Problem Relation Age of Onset  .  Diabetes Mother   . Hypertension Mother   . Cervical cancer Mother   . Skin cancer Mother   . Hypercholesterolemia Mother   . Heart disease Mother   . Kidney disease Mother   . Depression Father   . Hypertension Father   . Hypercholesterolemia Father   . Colon cancer Neg Hx    Social History:  Social History   Social History  . Marital Status: Married    Spouse Name: N/A  . Number of Children: 5  . Years of Education: N/A   Occupational History  .     Social History Main Topics  . Smoking status: Former Smoker    Quit date: 02/25/1964  . Smokeless tobacco: Never Used     Comment: smoked for two months  . Alcohol Use: Yes     Comment: rarely  . Drug Use: No  . Sexual Activity: Not Currently   Other Topics Concern  . None   Social History Narrative    Married, mother of 9, with at least one granddaughter who is present today.   Daily Caffeine Use:  2 cups in am;   She does not exercise routinely. Former smoker who quit in 1965.   Takes occasional alcohol beverage.   As the name is [employer] granddaughter's name is Miceala Lettiere   Additional History:  Lives with husband.   Assessment:   Musculoskeletal: Strength & Muscle Tone: within normal limits Gait & Station: normal Patient leans: N/A  Psychiatric Specialty Exam: Anxiety Symptoms include insomnia and nervous/anxious behavior.    Depression        Associated symptoms include insomnia.  Past medical history includes anxiety.     Review of Systems  Constitutional: Negative.  Negative for chills.  HENT: Negative.   Eyes: Negative.   Respiratory: Negative.   Gastrointestinal: Negative.   Genitourinary: Negative.   Musculoskeletal: Positive for back pain and joint pain.  Skin: Negative.   Neurological: Negative.   Endo/Heme/Allergies: Negative.   Psychiatric/Behavioral: Positive for depression. The patient is nervous/anxious and has insomnia.     Blood pressure 122/70, pulse 75, temperature 98.1 F (36.7 C), temperature source Tympanic, height 5\' 1"  (1.549 m), weight 148 lb (67.132 kg), SpO2 98 %.Body mass index is 27.98 kg/(m^2).  General Appearance: Casual  Eye Contact:  Fair  Speech:  Clear and Coherent  Volume:  Normal  Mood:  Anxious  Affect:  Congruent  Thought Process:  Coherent  Orientation:  Full (Time, Place, and Person)  Thought Content:  WDL  Suicidal Thoughts:  No  Homicidal Thoughts:  No  Memory:  NA  Judgement:  Fair  Insight:  Fair  Psychomotor Activity:  Normal  Concentration:  Fair  Recall:  [employer] of Knowledge: Fair  Language: Fair  Akathisia:  No  Handed:  Right  AIMS (if indicated):  none  Assets:  Communication Skills Desire for Improvement Social Support  ADL's:  Intact  Cognition: WNL  Sleep:  Not much 4-5 hours    Is the  patient at risk to self?  No. Has the patient been a risk to self in the past 6 months?  No. Has the patient been a risk to self within the distant past?  No. Is the patient a risk to others?  No. Has the patient been a risk to others in the past 6 months?  No. Has the patient been a risk to others within the distant past?  No.  Current Medications: Current Outpatient Prescriptions  Medication Sig Dispense Refill  . ALPRAZolam (XANAX) 0.25 MG tablet Take 1 tablet (0.25 mg total) by mouth at bedtime as needed for anxiety. 30 tablet 1  . fluticasone (FLONASE) 50 MCG/ACT nasal spray Place 2 sprays into both nostrils daily. 16 g 6  . latanoprost (XALATAN) 0.005 % ophthalmic solution Place 1 drop into both eyes at bedtime.     . metoprolol succinate (TOPROL-XL) 25 MG 24 hr tablet Take 0.5 tablets (12.5 mg total) by mouth every 12 (twelve) hours. 90 tablet 1  . nystatin (MYCOSTATIN) powder APPLY TO AFFECTED AREA 3 TIMES DAILY 30 g 0  . pantoprazole (PROTONIX) 20 MG tablet Take 2 tablets (40 mg total) by mouth daily. 60 tablet 5  . sertraline (ZOLOFT) 100 MG tablet Take 1 tablet (100 mg total) by mouth daily. 90 tablet 1  . temazepam (RESTORIL) 15 MG capsule Reported on 03/29/2016  0  . valsartan-hydrochlorothiazide (DIOVAN-HCT) 160-25 MG tablet TAKE 1 TABLET BY MOUTH DAILY. 90 tablet 1   No current facility-administered medications for this visit.    Medical Decision Making:  Established Problem, Stable/Improving (1) and Review of Psycho-Social Stressors (1)  Treatment Plan Summary:Medication management  Advised patient to take Zoloft 100mg   and she will be given prescription for the same  Patient was given a prescription of trazodone 50 mg by mouth daily at bedtime when necessary for her insomnia. She will take Xanax on a when necessary basis.    Follow-up in 1 months or earlier depending on her symptoms   More than 50% of the time spent in psychoeducation, counseling and coordination of  care.    This note was generated in part or whole with voice recognition software. Voice regonition is usually quite accurate but there are transcription errors that can and very often do occur. I apologize for any typographical errors that were not detected and corrected.   Rainey Pines, MD  04/05/2016, 10:31 AM

## 2016-04-16 ENCOUNTER — Ambulatory Visit: Payer: 59 | Admitting: Licensed Clinical Social Worker

## 2016-04-23 ENCOUNTER — Other Ambulatory Visit: Payer: Self-pay | Admitting: Family Medicine

## 2016-04-23 ENCOUNTER — Other Ambulatory Visit: Payer: Self-pay | Admitting: Psychiatry

## 2016-04-24 ENCOUNTER — Other Ambulatory Visit: Payer: Self-pay

## 2016-04-24 ENCOUNTER — Ambulatory Visit (INDEPENDENT_AMBULATORY_CARE_PROVIDER_SITE_OTHER): Payer: 59 | Admitting: Licensed Clinical Social Worker

## 2016-04-24 ENCOUNTER — Encounter: Payer: Self-pay | Admitting: Licensed Clinical Social Worker

## 2016-04-24 DIAGNOSIS — F332 Major depressive disorder, recurrent severe without psychotic features: Secondary | ICD-10-CM | POA: Diagnosis not present
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
## 2016-04-24 NOTE — Telephone Encounter (Signed)
received a fax requesting a refill on alprazolam .25mg .  pt was last seen on  04-05-16 next appt 05-03-16. pt will not have enough until seen at appt

## 2016-04-26 MED ORDER — ALPRAZOLAM 0.25 MG PO TABS: 0.2500 mg | ORAL_TABLET | Freq: Every evening | ORAL | Status: DC | PRN

## 2016-04-26 NOTE — Telephone Encounter (Signed)
Med refilled.

## 2016-04-26 NOTE — Telephone Encounter (Signed)
faxed and confirmed pharmacy with rx xanax .25mg  id # [id number] order # [id number]

## 2016-04-27 NOTE — Telephone Encounter (Signed)
faxed and confirmed - rx for alprazolam (xanax) .25 mg id # [id number] order # [id number]

## 2016-05-02 ENCOUNTER — Ambulatory Visit (INDEPENDENT_AMBULATORY_CARE_PROVIDER_SITE_OTHER): Payer: 59 | Admitting: Licensed Clinical Social Worker

## 2016-05-02 DIAGNOSIS — F332 Major depressive disorder, recurrent severe without psychotic features: Secondary | ICD-10-CM | POA: Diagnosis not present

## 2016-05-02 DIAGNOSIS — F411 Generalized anxiety disorder: Secondary | ICD-10-CM
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
## 2016-05-03 ENCOUNTER — Ambulatory Visit: Payer: 59 | Admitting: Psychiatry

## 2016-05-04 ENCOUNTER — Telehealth: Payer: Self-pay | Admitting: Licensed Clinical Social Worker

## 2016-05-04 NOTE — Telephone Encounter (Signed)
Therapist called Adult Protective Services to review whether patient would fit criteria for submitting a report. Therapist was referred to supervisor, Maryjean Morn, to review criteria.. Therapist left a voice message and also asked contact person at main number for submitting a report to send supervisor an email with therapist name and phone number.

## 2016-05-08 ENCOUNTER — Encounter: Payer: Self-pay | Admitting: Psychiatry

## 2016-05-08 ENCOUNTER — Ambulatory Visit (INDEPENDENT_AMBULATORY_CARE_PROVIDER_SITE_OTHER): Payer: 59 | Admitting: Psychiatry

## 2016-05-08 VITALS — BP 122/76 | HR 75 | Temp 98.7°F | Ht 61.0 in | Wt 142.2 lb

## 2016-05-08 DIAGNOSIS — F411 Generalized anxiety disorder: Secondary | ICD-10-CM | POA: Diagnosis not present

## 2016-05-08 DIAGNOSIS — F331 Major depressive disorder, recurrent, moderate: Secondary | ICD-10-CM

## 2016-05-08 MED ORDER — TRAZODONE HCL 50 MG PO TABS: 50.0000 mg | ORAL_TABLET | Freq: Every day | ORAL | Status: DC

## 2016-05-08 MED ORDER — SERTRALINE HCL 100 MG PO TABS: 100.0000 mg | ORAL_TABLET | Freq: Every day | ORAL | Status: DC
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
## 2016-05-17 ENCOUNTER — Ambulatory Visit (INDEPENDENT_AMBULATORY_CARE_PROVIDER_SITE_OTHER): Payer: 59 | Admitting: Licensed Clinical Social Worker

## 2016-05-17 DIAGNOSIS — F332 Major depressive disorder, recurrent severe without psychotic features: Secondary | ICD-10-CM | POA: Diagnosis not present

## 2016-05-24 ENCOUNTER — Ambulatory Visit (INDEPENDENT_AMBULATORY_CARE_PROVIDER_SITE_OTHER): Payer: 59 | Admitting: Licensed Clinical Social Worker

## 2016-05-24 DIAGNOSIS — F331 Major depressive disorder, recurrent, moderate: Secondary | ICD-10-CM | POA: Diagnosis not present

## 2016-05-24 DIAGNOSIS — F411 Generalized anxiety disorder: Secondary | ICD-10-CM | POA: Diagnosis not present

## 2016-05-25 NOTE — Progress Notes (Signed)
   THERAPIST PROGRESS NOTE  Session Time: 36min  Participation Level: Active  Behavioral Response: CasualAlertDepressed  Type of Therapy: Individual Therapy  Treatment Goals addressed: Coping and Diagnosis: Depression  Interventions: CBT, Motivational Interviewing, Solution Focused, Strength-based, Supportive and Reframing  Summary: Michele Meyer is a 70 y.o. female who presents with symptoms of her diagnosis.  LCSW discussed what psychotherapy is and is not and the importance of the therapeutic relationship to include open and honest communication between client and therapist and building trust.  Reviewed advantages and disadvantages of the therapeutic process and limitations to the therapeutic relationship including LCSW's role in maintaining the safety of the client, others and those in client's care.  Discussion of the transition of therapist and reviewed her treatment plan.  Provided homework of boundaries and financial planning.   Suicidal/Homicidal: Nowithout intent/plan  Therapist Response: LCSW provided Patient with ongoing emotional support and encouragement.  Normalized her feelings.  Commended Patient on her progress and reinforced the importance of client staying focused on her own strengths and resources and resiliency. Processed various strategies for dealing with stressors.    Plan: Return again in 1 weeks.  Diagnosis: Axis I: Major Depression, Recurrent severe    Axis II: No diagnosis    Lubertha South, LCSW 05/17/2016

## 2016-05-29 NOTE — Progress Notes (Signed)
   THERAPIST PROGRESS NOTE  Session Time: 83min  Participation Level: Active  Behavioral Response: NeatAlertAnxious  Type of Therapy: Individual Therapy  Treatment Goals addressed: Coping and Diagnosis: Anxiety & Depression  Interventions: CBT, Motivational Interviewing, Solution Focused, Supportive, Family Systems and Reframing  Summary: Michele Meyer is a 70 y.o. female who presents with continued symptoms of her diagnosis.  Assisted with assessing happiness, sadness, excitement, anger, disgust, and fear.  Patient explained details of their week including current stressors and worries. Patient recently discovered that her youngest son has a 62 year old biological daughter.   Patient continues to be stressed out and overwhelmed.  Patient has attempted to use coping skills recently taught.  Patient to continue to use effective communication while discussing stressful subjects related to her youngest son and his family.  Client discussed examples of situations where there are difficulties with boundaries and boundary setting and self-assertion.   Suicidal/Homicidal: Nowithout intent/plan  Therapist Response: LCSW provided Patient with ongoing emotional support and encouragement.  Normalized her feelings.  Commended Patient on her progress and reinforced the importance of client staying focused on her own strengths and resources and resiliency. Processed various strategies for dealing with stressors.    Plan: Return again in 1 weeks.  Diagnosis: Axis I: Generalized Anxiety Disorder and Depression, Moderate    Axis II: No diagnosis    Lubertha South, LCSW 05/25/2016

## 2016-05-31 ENCOUNTER — Ambulatory Visit (INDEPENDENT_AMBULATORY_CARE_PROVIDER_SITE_OTHER): Payer: 59 | Admitting: Licensed Clinical Social Worker

## 2016-05-31 DIAGNOSIS — F331 Major depressive disorder, recurrent, moderate: Secondary | ICD-10-CM | POA: Diagnosis not present

## 2016-05-31 DIAGNOSIS — F411 Generalized anxiety disorder: Secondary | ICD-10-CM | POA: Diagnosis not present

## 2016-06-01 ENCOUNTER — Other Ambulatory Visit: Payer: Self-pay | Admitting: Family Medicine
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
## 2016-06-04 ENCOUNTER — Ambulatory Visit (INDEPENDENT_AMBULATORY_CARE_PROVIDER_SITE_OTHER): Payer: 59 | Admitting: Licensed Clinical Social Worker

## 2016-06-04 DIAGNOSIS — F331 Major depressive disorder, recurrent, moderate: Secondary | ICD-10-CM

## 2016-06-04 DIAGNOSIS — F411 Generalized anxiety disorder: Secondary | ICD-10-CM | POA: Diagnosis not present

## 2016-06-11 NOTE — Progress Notes (Signed)
   THERAPIST PROGRESS NOTE  Session Time: 71min  Participation Level: Active  Behavioral Response: Well GroomedAlertDepressed  Type of Therapy: Individual Therapy  Treatment Goals addressed: Coping and Diagnosis: Depression & Anxiety  Interventions: Motivational Interviewing, Solution Focused and Supportive  Summary: Michele Meyer is a 70 y.o. female who presents with continued symptoms of her diagnosis.  She was able to vent her frustration and discuss how her current stressors are currently effecting her relationships with her children, grandchildren and her husband.  She continues to use coping skills previously taught but unable to manage effectively.  Factors that contribute to client's ongoing depressive symptoms were discussed and include real and perceived feelings of isolation, criticism, rejection, shame and guilt.    Suicidal/Homicidal: Nowithout intent/plan  Therapist Response: LCSW provided Patient with ongoing emotional support and encouragement.  Normalized her feelings.  Commended Patient on her progress and reinforced the importance of client staying focused on her own strengths and resources and resiliency. Processed various strategies for dealing with stressors.    Plan: Return again in 1 weeks.  Diagnosis: Axis I: Generalized Anxiety Disorder and Major Depression, Recurrent severe    Axis II: No diagnosis    Lubertha South, LCSW 06/04/2016

## 2016-06-13 ENCOUNTER — Ambulatory Visit (INDEPENDENT_AMBULATORY_CARE_PROVIDER_SITE_OTHER): Payer: 59 | Admitting: Licensed Clinical Social Worker

## 2016-06-13 DIAGNOSIS — F411 Generalized anxiety disorder: Secondary | ICD-10-CM | POA: Diagnosis not present

## 2016-06-15 NOTE — Progress Notes (Signed)
   THERAPIST PROGRESS NOTE  Session Time: 38min  Participation Level: Active  Behavioral Response: Neat and Well GroomedAlertAnxious  Type of Therapy: Individual Therapy  Treatment Goals addressed: Coping and Diagnosis: Anxiety  Interventions: CBT, Motivational Interviewing, Solution Focused, Strength-based, Supportive, Family Systems and Reframing  Summary: Michele Meyer is a 70 y.o. female who presents with continued symptoms of her diagnosis.  Review of her current stressors and how it is effecting her physically and with family relationships (husband, children).  Review of her current coping skills and how she can build upon them to incorporate additional coping skills.  Discussion of her being assertive with her son in order to change his unwanted behaviors.  Discussion of communication styles and gave examples of parenting each of her children differently.  Discussion on the drug abuse/dependence and how it effects the family.    Suicidal/Homicidal: Nowithout intent/plan  Therapist Response: LCSW provided Patient with ongoing emotional support and encouragement.  Normalized her feelings.  Commended Patient on her progress and reinforced the importance of client staying focused on her own strengths and resources and resiliency. Processed various strategies for dealing with stressors.   Plan: Return again in 1 weeks.  Diagnosis: Axis I: Generalized Anxiety Disorder    Axis II: No diagnosis    Lubertha South, LCSW 06/13/2016

## 2016-06-20 ENCOUNTER — Ambulatory Visit: Payer: 59 | Admitting: Licensed Clinical Social Worker

## 2016-06-25 ENCOUNTER — Other Ambulatory Visit: Payer: Medicare Other

## 2016-06-25 ENCOUNTER — Ambulatory Visit: Payer: Medicare Other

## 2016-06-28 ENCOUNTER — Ambulatory Visit (INDEPENDENT_AMBULATORY_CARE_PROVIDER_SITE_OTHER): Payer: 59 | Admitting: Licensed Clinical Social Worker

## 2016-06-28 DIAGNOSIS — F411 Generalized anxiety disorder: Secondary | ICD-10-CM

## 2016-07-03 ENCOUNTER — Ambulatory Visit (INDEPENDENT_AMBULATORY_CARE_PROVIDER_SITE_OTHER): Payer: Medicare Other | Admitting: Family Medicine

## 2016-07-03 ENCOUNTER — Encounter: Payer: Self-pay | Admitting: Family Medicine

## 2016-07-03 VITALS — BP 138/77 | HR 71 | Temp 98.0°F | Ht 62.0 in | Wt 138.0 lb

## 2016-07-03 DIAGNOSIS — D229 Melanocytic nevi, unspecified: Secondary | ICD-10-CM

## 2016-07-03 DIAGNOSIS — L989 Disorder of the skin and subcutaneous tissue, unspecified: Secondary | ICD-10-CM | POA: Diagnosis not present

## 2016-07-03 DIAGNOSIS — I10 Essential (primary) hypertension: Secondary | ICD-10-CM | POA: Diagnosis not present

## 2016-07-03 DIAGNOSIS — I700 Atherosclerosis of aorta: Secondary | ICD-10-CM | POA: Diagnosis not present

## 2016-07-03 DIAGNOSIS — Z23 Encounter for immunization: Secondary | ICD-10-CM | POA: Diagnosis not present

## 2016-07-03 NOTE — Assessment & Plan Note (Signed)
Concerned about carotid stenosis, would like ultrasound. Ordered today.

## 2016-07-03 NOTE — Assessment & Plan Note (Signed)
Under good control today. Continue current regimen. Continue to monitor. Concerned about carotid stenosis, would like ultrasound. Ordered today.

## 2016-07-03 NOTE — Progress Notes (Signed)
BP 138/77 mmHg  Pulse 71  Temp(Src) 98 F (36.7 C)  Ht 5\' 2"  (1.575 m)  Wt 138 lb (62.596 kg)  BMI 25.23 kg/m2  SpO2 99%   Subjective:    Patient ID: Michele Meyer, female    DOB: 1946-11-20, 70 y.o.   MRN: [id number]  HPI: Michele Meyer is a 70 y.o. female  Chief Complaint  Patient presents with  . shingles    Patient would like to know if she needs to get the Shingles Vaccine, since    HYPERTENSION- she is concerned about her carotids and would like to get an ultrasound Hypertension status: controlled  Satisfied with current treatment? yes Duration of hypertension: chronic BP monitoring frequency:  not checking BP medication side effects:  no Medication compliance: excellent compliance Aspirin: no Recurrent headaches: no Visual changes: no Palpitations: no Dyspnea: no Chest pain: no Lower extremity edema: no Dizzy/lightheaded: no  SKIN LESION Duration: months Location: L thigh Painful: yes Itching: yes Onset: gradual Context: scaling and not healing Associated signs and symptoms:  History of skin cancer: yes History of precancerous skin lesions: yes Family history of skin cancer: yes   Relevant past medical, surgical, family and social history reviewed and updated as indicated. Interim medical history since our last visit reviewed. Allergies and medications reviewed and updated.  Review of Systems  Constitutional: Negative.   Respiratory: Negative.   Cardiovascular: Negative.   Skin: Positive for wound. Negative for color change, pallor and rash.  Psychiatric/Behavioral: Negative.    Per HPI unless specifically indicated above     Objective:    BP 138/77 mmHg  Pulse 71  Temp(Src) 98 F (36.7 C)  Ht 5\' 2"  (1.575 m)  Wt 138 lb (62.596 kg)  BMI 25.23 kg/m2  SpO2 99%  Wt Readings from Last 3 Encounters:  07/03/16 138 lb (62.596 kg)  05/08/16 142 lb 3.2 oz (64.501 kg)  04/05/16 148 lb (67.132 kg)    Physical Exam  Constitutional: She is  oriented to person, place, and time. She appears well-developed and well-nourished. No distress.  HENT:  Head: Normocephalic and atraumatic.  Right Ear: Hearing normal.  Left Ear: Hearing normal.  Nose: Nose normal.  No bruits auscultated today   Eyes: Conjunctivae and lids are normal. Right eye exhibits no discharge. Left eye exhibits no discharge. No scleral icterus.  Cardiovascular: Normal rate, regular rhythm, normal heart sounds and intact distal pulses.  Exam reveals no gallop and no friction rub.   No murmur heard. Pulmonary/Chest: Effort normal and breath sounds normal. No respiratory distress. She has no wheezes. She has no rales. She exhibits no tenderness.  Musculoskeletal: Normal range of motion.  Neurological: She is alert and oriented to person, place, and time.  Skin: Skin is warm, dry and intact. No rash noted. She is not diaphoretic. No erythema. No pallor.  1.5cm scaley erythematous lesion on L anterior thigh, >200 nevi on body  Psychiatric: She has a normal mood and affect. Her speech is normal and behavior is normal. Judgment and thought content normal. Cognition and memory are normal.  Nursing note and vitals reviewed.   Results for orders placed or performed in visit on 02/27/16  Microalbumin, Urine Waived  Result Value Ref Range   Microalb, Ur Waived 10 0 - 19 mg/L   Creatinine, Urine Waived 50 10 - 300 mg/dL   Microalb/Creat Ratio <30 <30 mg/g  Comprehensive metabolic panel  Result Value Ref Range   Glucose 87 65 - 99  mg/dL   BUN 11 8 - 27 mg/dL   Creatinine, Ser 0.81 0.57 - 1.00 mg/dL   GFR calc non Af Amer 73 >59 mL/min/1.73   GFR calc Af Amer 85 >59 mL/min/1.73   BUN/Creatinine Ratio 14 11 - 26   Sodium 129 (L) 134 - 144 mmol/L   Potassium 4.4 3.5 - 5.2 mmol/L   Chloride 87 (L) 96 - 106 mmol/L   CO2 27 18 - 29 mmol/L   Calcium 9.4 8.7 - 10.3 mg/dL   Total Protein 6.9 6.0 - 8.5 g/dL   Albumin 4.3 3.5 - 4.8 g/dL   Globulin, Total 2.6 1.5 - 4.5 g/dL    Albumin/Globulin Ratio 1.7 1.1 - 2.5   Bilirubin Total 0.3 0.0 - 1.2 mg/dL   Alkaline Phosphatase 105 39 - 117 IU/L   AST 17 0 - 40 IU/L   ALT 16 0 - 32 IU/L  Lipid Panel Piccolo, Waived  Result Value Ref Range   Cholesterol Piccolo, Waived WILL FOLLOW    HDL Chol Piccolo, Waived CANCELED    Triglycerides Piccolo,Waived CANCELED   UA/M w/rflx Culture, Routine  Result Value Ref Range   Specific Gravity, UA 1.010 1.005 - 1.030   pH, UA 6.0 5.0 - 7.5   Color, UA Yellow Yellow   Appearance Ur Clear Clear   Leukocytes, UA Negative Negative   Protein, UA Negative Negative/Trace   Glucose, UA Negative Negative   Ketones, UA Negative Negative   RBC, UA Trace (A) Negative   Bilirubin, UA Negative Negative   Urobilinogen, Ur 0.2 0.2 - 1.0 mg/dL   Nitrite, UA Negative Negative  Lyme Ab/Western Blot Reflex  Result Value Ref Range   Lyme IgG/IgM Ab <0.91 0.00 - 0.90 ISR   LYME DISEASE AB, QUANT, IGM <0.80 0.00 - 0.79 index  Rocky mtn spotted fvr abs pnl(IgG+IgM)  Result Value Ref Range   RMSF IgG Negative Negative   RMSF IgM 0.45 0.00 - 0.89 index  Babesia microti Antibody Panel  Result Value Ref Range   Babesia microti IgM <1:10 Neg:<1:10   Babesia microti IgG [id number]  Ehrlichia Antibody Panel  Result Value Ref Range   E.Chaffeensis (HME) IgG Negative Neg:<1:64   E. Chaffeensis (HME) IgM Titer Negative Neg:<1:20   HGE IgG Titer Negative Neg:<1:64   HGE IgM Titer Negative Neg:<1:20  Antinuclear Antib (ANA)  Result Value Ref Range   Anit Nuclear Antibody(ANA) Negative Negative  Rheumatoid Factor  Result Value Ref Range   Rhuematoid fact SerPl-aCnc 70.3 (H) 0.0 - 13.9 IU/mL  CBC With Differential/Platelet  Result Value Ref Range   WBC 7.1 3.4 - 10.8 x10E3/uL   RBC 4.15 3.77 - 5.28 x10E6/uL   Hemoglobin 12.3 11.1 - 15.9 g/dL   Hematocrit 33.5 (L) 34.0 - 46.6 %   MCV 81 79 - 97 fL   MCH 30.0 26.6 - 33.0 pg   MCHC 37.0 (H) 31.5 - 35.7 g/dL   RDW 40.0 (H) 12.3 - 15.4 %    Platelets 246 150 - 379 x10E3/uL   Neutrophils 78 %   Lymphs 13 %   MID 9 %   Neutrophils Absolute 6.0 1.4 - 7.0 x10E3/uL   Lymphocytes Absolute 1.0 0.7 - 3.1 x10E3/uL   MID (Absolute) 1.0 0.1 - 1.6 X10E3/uL  Specimen status report  Result Value Ref Range   specimen status report Comment       Assessment & Plan:   Problem List Items Addressed This Visit  Cardiovascular and Mediastinum   Hypertension - Primary (Chronic)    Under good control today. Continue current regimen. Continue to monitor. Concerned about carotid stenosis, would like ultrasound. Ordered today.      Relevant Orders   US Carotid Bilateral   Aortic atherosclerosis ([place])    Concerned about carotid stenosis, would like ultrasound. Ordered today.      Relevant Orders   US Carotid Bilateral    Other Visit Diagnoses    Immunization due        Prevnar given today.    Relevant Orders    Pneumococcal conjugate vaccine 13-valent IM (Completed)    Skin lesion        Concerning mole on L thigh in that it is not healing- will try to get her into derm ASAP. If she cannot be seen in 3-4 weeks, will have her return for shave bx.    Relevant Orders    Ambulatory referral to Dermatology    Nevus        >200 nevi with a history of skin cancer. Needs to see dermatology. Referral generated today.    Relevant Orders    Ambulatory referral to Dermatology        Follow up plan: Return in about 3 months (around 10/03/2016) for Wellness Exam.

## 2016-07-05 ENCOUNTER — Ambulatory Visit: Payer: 59 | Admitting: Licensed Clinical Social Worker

## 2016-07-07 ENCOUNTER — Other Ambulatory Visit: Payer: Self-pay | Admitting: Psychiatry

## 2016-07-09 ENCOUNTER — Other Ambulatory Visit: Payer: Self-pay | Admitting: Psychiatry

## 2016-07-09 NOTE — Telephone Encounter (Signed)
Pt needs an appointment . Not seen since May

## 2016-07-12 ENCOUNTER — Ambulatory Visit: Payer: 59 | Admitting: Licensed Clinical Social Worker

## 2016-07-16 NOTE — Progress Notes (Signed)
SAY Michele Meyer is a 70 y.o. female patient see other note.        Lubertha South, LCSW

## 2016-07-16 NOTE — Progress Notes (Signed)
   THERAPIST PROGRESS NOTE  Session Time: 27  Participation Level: Active  Behavioral Response: Casual, Neat and Well GroomedAlertDepressed  Type of Therapy: Individual Therapy  Treatment Goals addressed: Coping  Interventions: CBT, Motivational Interviewing, Solution Focused, Strength-based, Supportive, Family Systems and Reframing  Summary: Michele Meyer is a 70 y.o. female who presents with continued symptoms of her diagnosis. She continues to struggle with family dynamics with her son and his family.  Patient reports that her health is failing and she continues to be stressed and unable to cope with financial responsibilities.  She reports that she will attend her granddaughter's graduation and asked her daughter for assistance with cleaning out their second home to rent out.  Patient reports she and her husband are making financial plans to decrease their debt.  Reports that she is unable to use coping skills taught.  Denies writing journal.  Reports that venting to therapies and one of her daughters is helpful.  Denies panic attack.  Suicidal/Homicidal: Nowithout intent/plan  Therapist Response: LCSW provided Patient with ongoing emotional support and encouragement.  Normalized her feelings.  Commended Patient on her progress and reinforced the importance of client staying focused on her own strengths and resources and resiliency. Processed various strategies for dealing with stressors.    Plan: Return again in 2weeks.  Diagnosis: Axis I: Generalized Anxiety Disorder    Axis II: No diagnosis    Lubertha South, LCSW 06/28/2016

## 2016-07-17 ENCOUNTER — Ambulatory Visit (INDEPENDENT_AMBULATORY_CARE_PROVIDER_SITE_OTHER): Payer: 59 | Admitting: Licensed Clinical Social Worker

## 2016-07-17 DIAGNOSIS — F331 Major depressive disorder, recurrent, moderate: Secondary | ICD-10-CM

## 2016-07-17 DIAGNOSIS — F411 Generalized anxiety disorder: Secondary | ICD-10-CM

## 2016-07-19 ENCOUNTER — Ambulatory Visit: Payer: Medicare Other | Attending: Family Medicine

## 2016-07-19 NOTE — Progress Notes (Signed)
   THERAPIST PROGRESS NOTE  Session Time: 7min  Participation Level: Active  Behavioral Response: Casual and Well GroomedAlertAnxious  Type of Therapy: Individual Therapy  Treatment Goals addressed: Coping  Interventions: CBT, Motivational Interviewing, Solution Focused, Strength-based, Supportive, Family Systems and Reframing  Summary: Michele Meyer is a 70 y.o. female who presents with continued symptoms of her diagnosis.  Therapist performed a brief mood check assessing happiness, sadness, excitement, anger, disgust, and fear.  Therapist provided active listening for Patient as she explained details of her week including current stressors and worries. Therapist provided praise for Patient as she explained points of progress such as, feeling a little less anxious, and more positive reactions to stimuli, that in the past would have caused a negative response from her. Therapist shifted focus to Patient's current worries and causes of her slightly low mood.  Therapist addressed Patient's feelings of inadequacy associated with lack of financial income and the possibility of filing Bankruptcy. Therapist assisted Patient with a problem-solving exercise to assist with decreasing negative feelings associated with this issue.    Suicidal/Homicidal: Nowithout intent/plan  Therapist Response: LCSW provided Patient with ongoing emotional support and encouragement.  Normalized her feelings.  Commended Patient on her progress and reinforced the importance of client staying focused on her own strengths and resources and resiliency. Processed various strategies for dealing with stressors.    Plan: Return again in 1 weeks.  Diagnosis: Axis I: Generalized Anxiety Disorder and Depression    Axis II: No diagnosis    Lubertha South, LCSW 07/18/2016

## 2016-07-23 ENCOUNTER — Ambulatory Visit: Payer: Self-pay | Admitting: Psychiatry

## 2016-07-31 ENCOUNTER — Ambulatory Visit (INDEPENDENT_AMBULATORY_CARE_PROVIDER_SITE_OTHER): Payer: 59 | Admitting: Licensed Clinical Social Worker

## 2016-07-31 DIAGNOSIS — F411 Generalized anxiety disorder: Secondary | ICD-10-CM | POA: Diagnosis not present

## 2016-07-31 DIAGNOSIS — F331 Major depressive disorder, recurrent, moderate: Secondary | ICD-10-CM | POA: Diagnosis not present

## 2016-08-03 ENCOUNTER — Other Ambulatory Visit: Payer: Self-pay | Admitting: Psychiatry

## 2016-08-07 IMAGING — CT CT ABD-PELV W/ CM
2 of 6 series · 16 of 46 positions shown, 18 images · IV contrast (omnipaque)
Comparison: None.

CLINICAL DATA: Patient c/o Abdominal fullness, early satiety and
RUQ pain x 7-8 months. NKI. Hx Basal Cell/Squamous Cell CA of Right
chest area resected. Hx Cholecystectomy, Appendectomy + Total
Hysterectomy.

EXAM:
CT ABDOMEN AND PELVIS WITH CONTRAST
TECHNIQUE: Multidetector CT imaging of the abdomen and pelvis was performed
using the standard protocol following bolus administration of
intravenous contrast.
CONTRAST:  100 mL Omnipaque 300 IV.

[Series 2: routine with · axial · 0.71mm/px · z∈[-876,-516]mm · 13 of 82 slices shown, 15 images]
[im 5/82  soft-tissue]
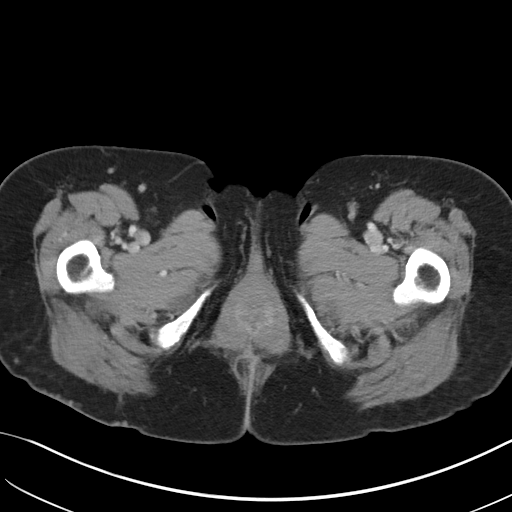
[im 5/82  bone]
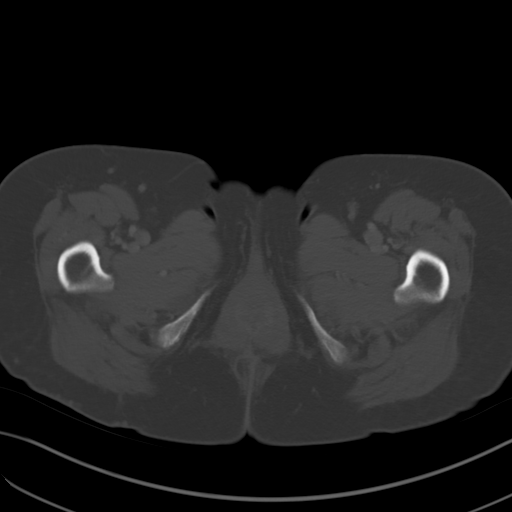
[im 10/82  soft-tissue]
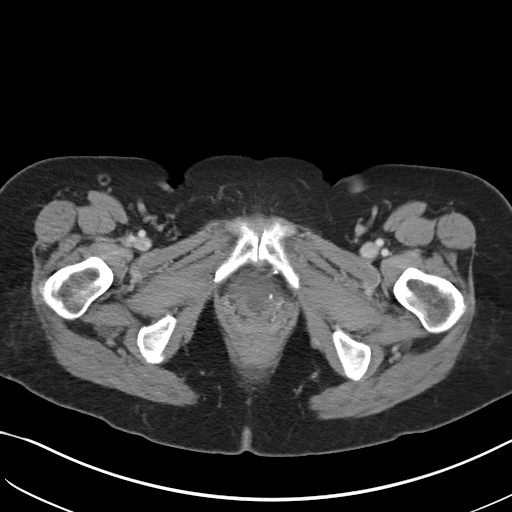
[im 20/82  soft-tissue]
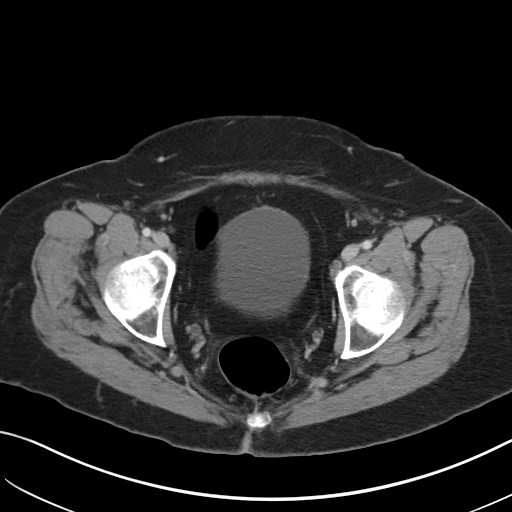
[im 24/82  soft-tissue]
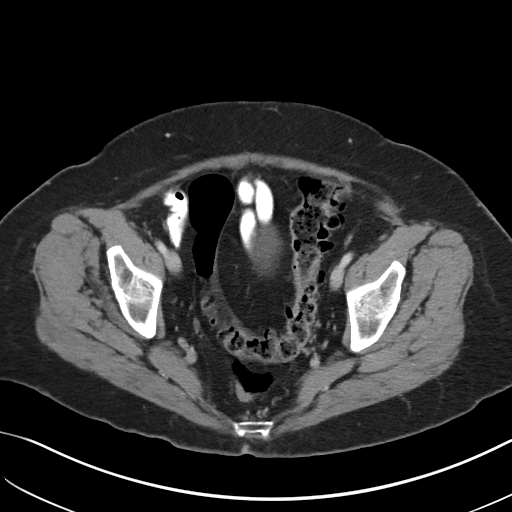
[im 29/82  soft-tissue]
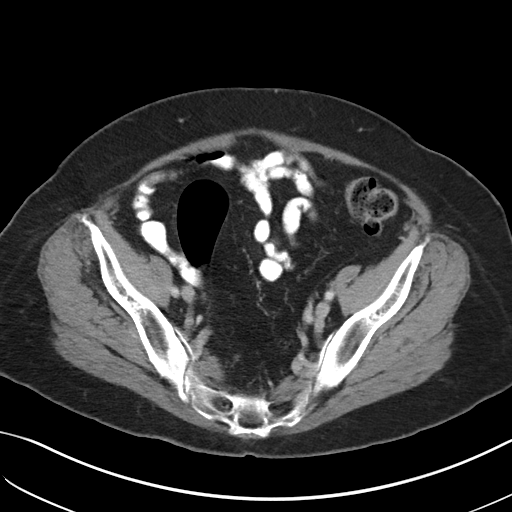
[im 34/82  soft-tissue]
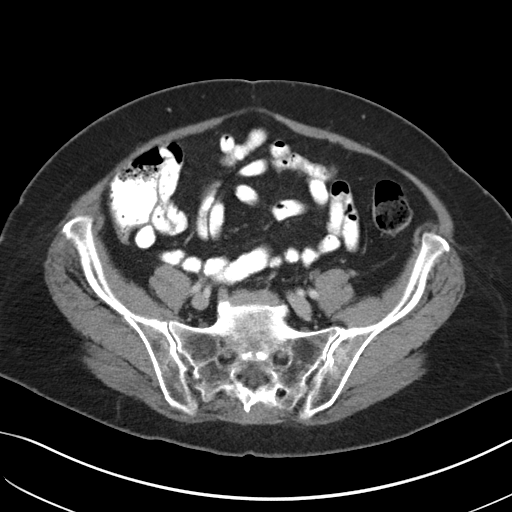
[im 43/82  soft-tissue]
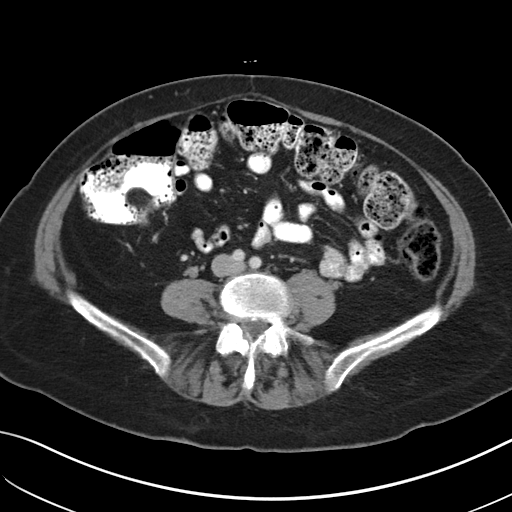
[im 48/82  soft-tissue]
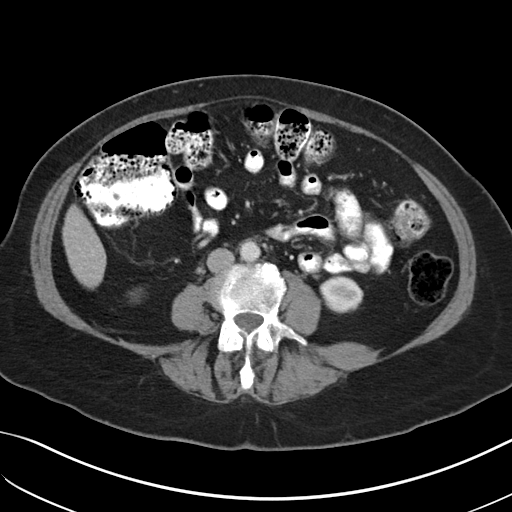
[im 53/82  soft-tissue]
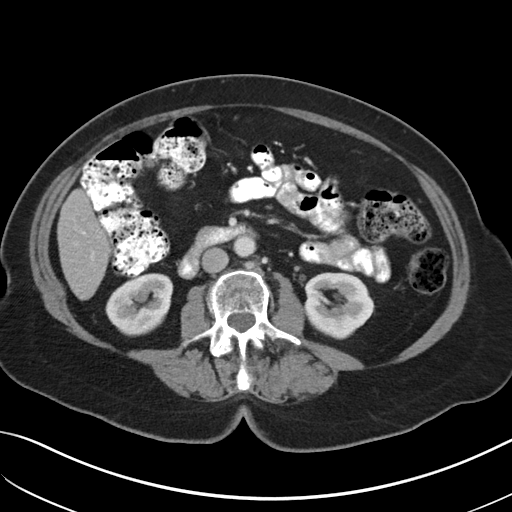
[im 53/82  bone]
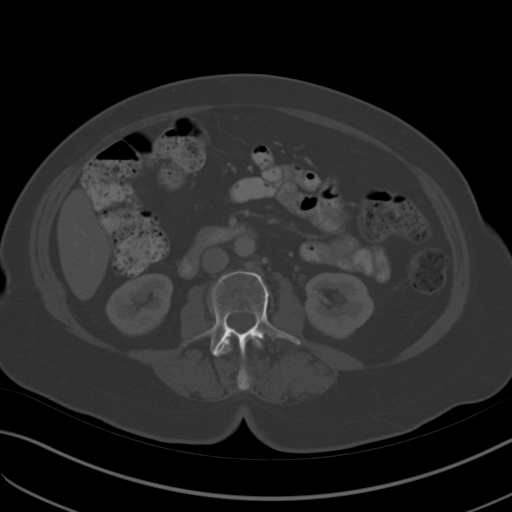
[im 58/82  soft-tissue]
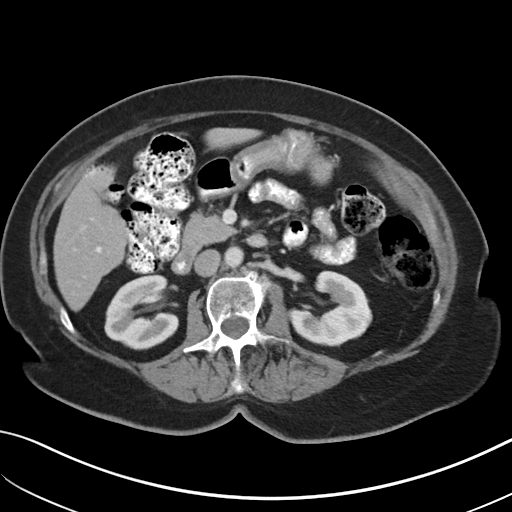
[im 62/82  soft-tissue]
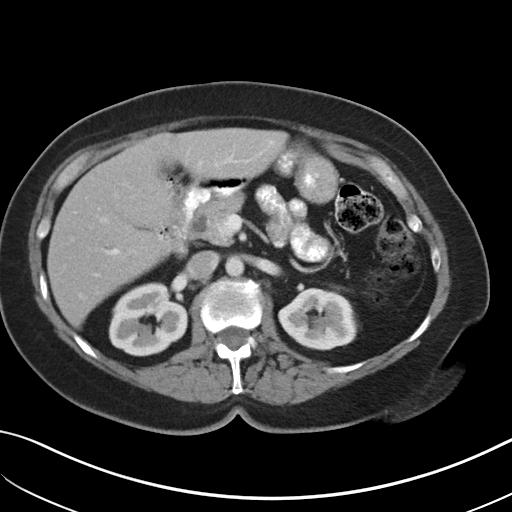
[im 72/82  soft-tissue]
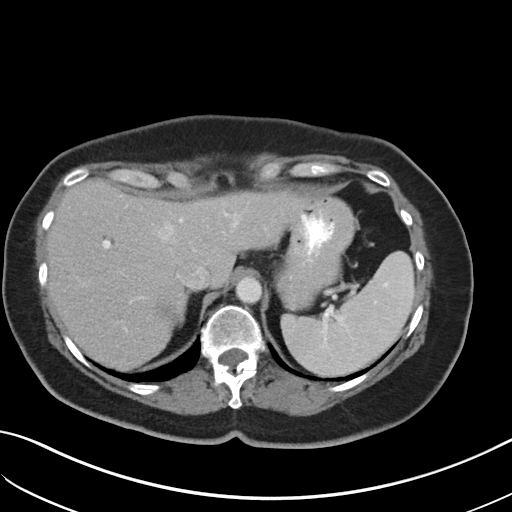
[im 77/82  soft-tissue]
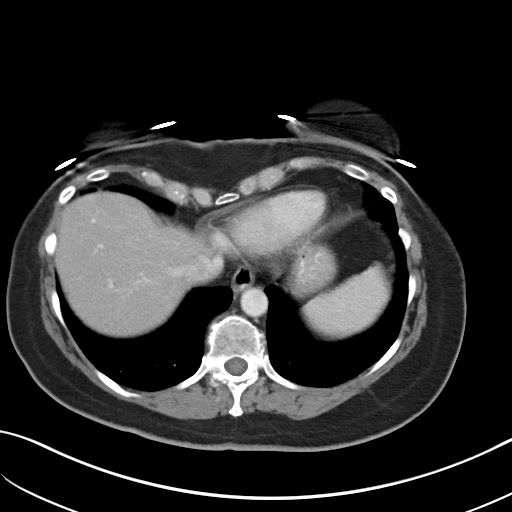

[Series 9: cor routine with · coronal · 0.68mm/px · 3 of 146 slices shown]
[im 49/146  soft-tissue]
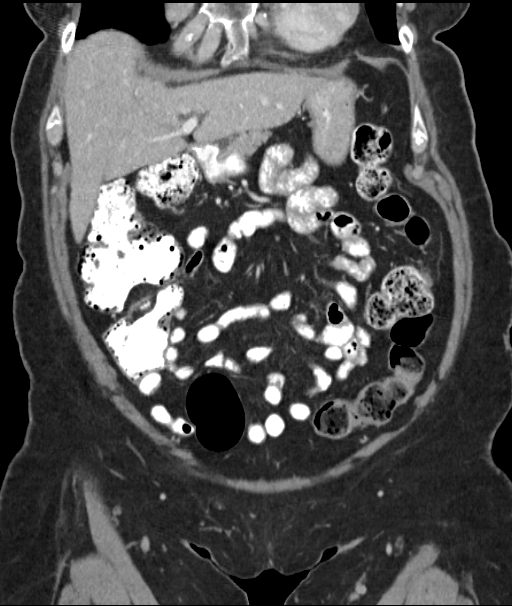
[im 65/146  soft-tissue]
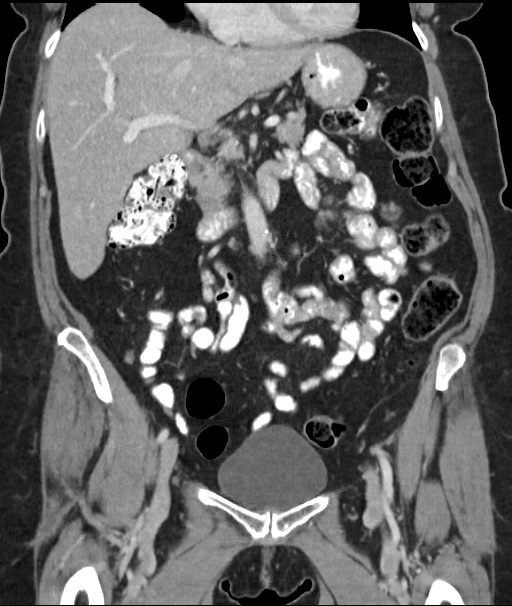
[im 81/146  soft-tissue]
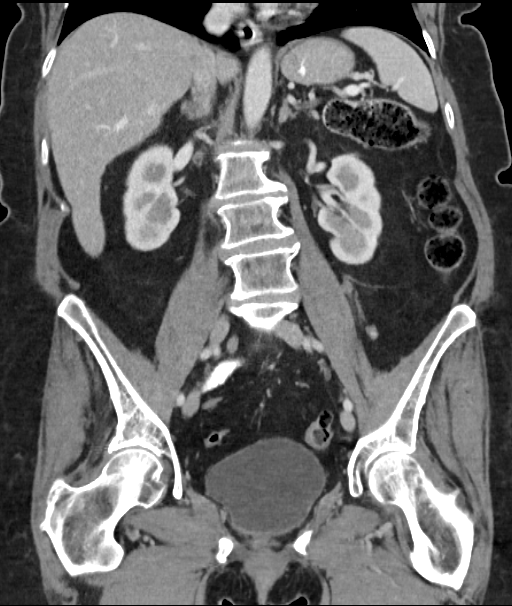

[16 of 46 positions shown; findings below may reference images not displayed]

FINDINGS: Lung bases are unremarkable. There is mild pectus excavatum
deformity.

Abdominal images demonstrate evidence of a previous cholecystectomy.
The spleen, pancreas, liver and adrenal glands are unremarkable.
Kidneys are normal in size without hydronephrosis or
nephrolithiasis. There is a 2.9 cm simple cyst over the upper pole
right kidney. Ureters are unremarkable. Surgical absence of the
appendix. There is subtle calcified plaque over the abdominal aorta.
Mesentery is within normal. There is no free fluid or focal
inflammatory change. There is diverticulosis of the sigmoid colon
without active inflammation.

Pelvic images demonstrate surgical absence of the uterus. There are
several phleboliths present. The bladder and rectum are
unremarkable. There are mild degenerative changes of the spine.
IMPRESSION: No acute findings in the abdomen/pelvis.

Diverticulosis of the sigmoid colon without active inflammation.

2.9 cm simple cyst upper pole right kidney.

Postsurgical changes as described.

## 2016-08-14 ENCOUNTER — Ambulatory Visit (INDEPENDENT_AMBULATORY_CARE_PROVIDER_SITE_OTHER): Payer: 59 | Admitting: Licensed Clinical Social Worker

## 2016-08-14 DIAGNOSIS — F331 Major depressive disorder, recurrent, moderate: Secondary | ICD-10-CM

## 2016-08-14 DIAGNOSIS — F411 Generalized anxiety disorder: Secondary | ICD-10-CM

## 2016-08-15 ENCOUNTER — Ambulatory Visit (INDEPENDENT_AMBULATORY_CARE_PROVIDER_SITE_OTHER): Payer: 59 | Admitting: Psychiatry

## 2016-08-15 VITALS — BP 138/70 | HR 73 | Resp 16 | Ht 61.5 in | Wt 138.8 lb

## 2016-08-15 DIAGNOSIS — F331 Major depressive disorder, recurrent, moderate: Secondary | ICD-10-CM | POA: Diagnosis not present

## 2016-08-15 DIAGNOSIS — F411 Generalized anxiety disorder: Secondary | ICD-10-CM | POA: Diagnosis not present

## 2016-08-15 MED ORDER — SERTRALINE HCL 100 MG PO TABS: 150.0000 mg | ORAL_TABLET | Freq: Every day | ORAL | 1 refills | Status: DC

## 2016-08-15 MED ORDER — TRAZODONE HCL 50 MG PO TABS
50.0000 mg | ORAL_TABLET | Freq: Every day | ORAL | 2 refills | Status: DC
Start: 2016-08-15 — End: 2016-10-15

## 2016-08-15 NOTE — Progress Notes (Signed)
[facility] MD/PA/NP OP Progress Note  08/15/2016 11:02 AM Michele Meyer  MRN:  [id number]  Subjective:    Patient is  a 70 year old married female who presented for the follow-up appointment. She reported that she is worried about her son who has been separated from his wife and [place]. She reported that he has started working independently and she is surprised about much improvement she has noticed that his behavior. She reported that she is very supportive about him. Patient reported that she is compliant with her medications but is still feels anxious and wants to have the dosage of her medications adjusted. She is sleeping very well with the help of trazodone. She takes 50 mg on a daily basis. She currently denied having any suicidal ideations or plans. Patient reported that she is continuing her therapy on a regular basis and it is helpful. She denied having any mood swings anger anxiety or paranoia.     Chief Complaint:   Visit Diagnosis:     ICD-9-CM ICD-10-CM   1. MDD (major depressive disorder), recurrent episode, moderate (HCC) 296.32 F33.1   2. GAD (generalized anxiety disorder) 300.02 F41.1     Past Medical History:  Past Medical History:  Diagnosis Date  . Anxiety   . Arthritis   . Bladder filling defect    bladder sling protrusing last 3 years   . Chronic cystitis   . Cyst of right kidney   . Cystocele   . Depression   . Diverticulosis   . Dyslipidemia   . Endometriosis   . Gestational diabetes mellitus 30 years ago   with pregnancy   . Glaucoma    both eyes  . Incomplete bladder emptying   . Labile hypertension   . PONV (postoperative nausea and vomiting)   . Skin cancer   . Skin cancer    basal and squamous cell  . Stress incontinence   . Uterovaginal prolapse, incomplete   . UTI (lower urinary tract infection)     Past Surgical History:  Procedure Laterality Date  . ABDOMINAL HYSTERECTOMY     complete  . ANTERIOR AND POSTERIOR VAGINAL  REPAIR    . APPENDECTOMY    . BREAST BIOPSY    . BREAST LUMPECTOMY     benign  . prolapsed bladder     Repair Dr.Cope  . PUBOVAGINAL SLING  4 years ago   protrusion of bladder sling for last 3 years  . removal of first rib     bilaterally  . TONSILLECTOMY    . TOTAL KNEE ARTHROPLASTY Right 10/03/2015   Procedure: RIGHT TOTAL KNEE ARTHROPLASTY;  Surgeon: Gaynelle Arabian, MD;  Location: [facility];  Service: Orthopedics;  Laterality: Right;  . VEIN LIGATION AND STRIPPING Bilateral    Family History:  Family History  Problem Relation Age of Onset  . Diabetes Mother   . Hypertension Mother   . Cervical cancer Mother   . Skin cancer Mother   . Hypercholesterolemia Mother   . Heart disease Mother   . Kidney disease Mother   . Depression Father   . Hypertension Father   . Hypercholesterolemia Father   . Colon cancer Neg Hx    Social History:  Social History   Social History  . Marital status: Married    Spouse name: N/A  . Number of children: 5  . Years of education: N/A   Occupational History  .  Retired   Social History Main Topics  .  Smoking status: Former Smoker    Quit date: 02/25/1964  . Smokeless tobacco: Never Used     Comment: smoked for two months  . Alcohol use Yes     Comment: rarely  . Drug use: No  . Sexual activity: Not Currently   Other Topics Concern  . Not on file   Social History Narrative   Married, mother of 63, with at least one granddaughter who is present today.   Daily Caffeine Use:  2 cups in am;   She does not exercise routinely. Former smoker who quit in 1965.   Takes occasional alcohol beverage.   As the name is [employer] granddaughter's name is Avriana Rinkus   Additional History:  Lives with husband.   Assessment:   Musculoskeletal: Strength & Muscle Tone: within normal limits Gait & Station: normal Patient leans: N/A  Psychiatric Specialty Exam: Anxiety  Symptoms include insomnia and nervous/anxious behavior.    Depression          Associated symptoms include insomnia.  Past medical history includes anxiety.     Review of Systems  Constitutional: Negative.  Negative for chills.  HENT: Negative.   Eyes: Negative.   Respiratory: Negative.   Gastrointestinal: Negative.   Genitourinary: Negative.   Musculoskeletal: Positive for back pain and joint pain.  Skin: Negative.   Neurological: Negative.   Endo/Heme/Allergies: Negative.   Psychiatric/Behavioral: Positive for depression. The patient is nervous/anxious and has insomnia.     Blood pressure 138/70, pulse 73, resp. rate 16, height 5' 1.5" (1.562 m), weight 138 lb 12.8 oz (63 kg).Body mass index is 25.8 kg/m.  General Appearance: Casual  Eye Contact:  Fair  Speech:  Clear and Coherent  Volume:  Normal  Mood:  Anxious  Affect:  Congruent  Thought Process:  Coherent  Orientation:  Full (Time, Place, and Person)  Thought Content:  WDL  Suicidal Thoughts:  No  Homicidal Thoughts:  No  Memory:  NA  Judgement:  Fair  Insight:  Fair  Psychomotor Activity:  Normal  Concentration:  Fair  Recall:  [employer] of Knowledge: Fair  Language: Fair  Akathisia:  No  Handed:  Right  AIMS (if indicated):  none  Assets:  Communication Skills Desire for Improvement Social Support  ADL's:  Intact  Cognition: WNL  Sleep:  Not much 4-5 hours    Is the patient at risk to self?  No. Has the patient been a risk to self in the past 6 months?  No. Has the patient been a risk to self within the distant past?  No. Is the patient a risk to others?  No. Has the patient been a risk to others in the past 6 months?  No. Has the patient been a risk to others within the distant past?  No.  Current Medications: Current Outpatient Prescriptions  Medication Sig Dispense Refill  . fluticasone (FLONASE) 50 MCG/ACT nasal spray Place 2 sprays into both nostrils daily. 16 g 6  . latanoprost (XALATAN) 0.005 % ophthalmic solution Place 1 drop into both eyes at bedtime.     .  metoprolol succinate (TOPROL-XL) 25 MG 24 hr tablet TAKE 1/2 TABLET (12.5 MG TOTAL) BY MOUTH EVERY 12 HOURS. 90 tablet 1  . nystatin (MYCOSTATIN) powder APPLY TO AFFECTED AREA 3 TIMES DAILY 30 g 0  . pantoprazole (PROTONIX) 20 MG tablet TAKE 2 TABLETS (40 MG TOTAL) BY MOUTH DAILY. 60 tablet 5  . sertraline (ZOLOFT) 100 MG tablet Take 1.5 tablets (150  mg total) by mouth daily. 135 tablet 1  . traZODone (DESYREL) 50 MG tablet Take 1 tablet (50 mg total) by mouth at bedtime. 30 tablet 2  . valsartan-hydrochlorothiazide (DIOVAN-HCT) 160-25 MG tablet TAKE 1 TABLET BY MOUTH DAILY. 90 tablet 1   No current facility-administered medications for this visit.     Medical Decision Making:  Established Problem, Stable/Improving (1) and Review of Psycho-Social Stressors (1)  Treatment Plan Summary:Medication management  Advised patient to take Zoloft 100mg  In the morning and 50 mg at bedtime. Patient was given 90 day supply of the medication.  Patient was given a prescription of trazodone 50 mg by mouth daily at bedtime when necessary for her insomnia.     Follow-up in 2 months or earlier depending on her symptoms   More than 50% of the time spent in psychoeducation, counseling and coordination of care.    This note was generated in part or whole with voice recognition software. Voice regonition is usually quite accurate but there are transcription errors that can and very often do occur. I apologize for any typographical errors that were not detected and corrected.   Rainey Pines, MD  08/15/2016, 11:02 AM

## 2016-08-16 NOTE — Progress Notes (Signed)
   THERAPIST PROGRESS NOTE  Session Time: 40  Participation Level: Active  Behavioral Response: Neat and Well GroomedAlertEuthymic  Type of Therapy: Individual Therapy  Treatment Goals addressed: Coping  Interventions: CBT, Motivational Interviewing and Solution Focused  Summary: Michele Meyer is a 70 y.o. female who presents with continued symptoms of her diagnosis.  Patient stated that she did keep a tally of her strengths throughout the week as she agreed to. Patient stated that she was not in the best of moods throughout the week and spent most of her days in his house anxious and worried.    Patient stated that she thought about previous sessions when she was asked to come up with different things to do instead of worrying.   Patient stated that she was not sure why she was having the depressed mood but she did not want to always feel that way.     Suicidal/Homicidal: Nowithout intent/plan  Therapist Response: Clinician reviewed the last session and encouraged Patient to talk about the tasks that she completed that required her strengths. Clinician instituted a system of dealing with each weakness listed (focusing on one weakness at a time, beginning with the least significant).  Clinician encouraged Patient to keep a mental note of each time she let her anxiety mood interfere with her daily activities.  Clinician suggested that Patient consider which of her listed strengths could have changed the outcome of those situations where moods of depression start to take control.  Clinician analyzed Patient's reactions to the newly instituted systems via questions and nonverbal observation.   Clinician presented the opportunity for Patient to express any specific issue that she may have recently had that had caused her to exhibit one of the listed weaknesses. .  Plan: Return again in2 weeks.  Diagnosis: Axis I: Generalized Anxiety Disorder and Depression    Axis II: No diagnosis    Lubertha South, LCSW

## 2016-08-28 ENCOUNTER — Ambulatory Visit (INDEPENDENT_AMBULATORY_CARE_PROVIDER_SITE_OTHER): Payer: 59 | Admitting: Licensed Clinical Social Worker

## 2016-08-28 DIAGNOSIS — F331 Major depressive disorder, recurrent, moderate: Secondary | ICD-10-CM | POA: Diagnosis not present

## 2016-08-28 DIAGNOSIS — F411 Generalized anxiety disorder: Secondary | ICD-10-CM | POA: Diagnosis not present

## 2016-08-30 NOTE — Progress Notes (Signed)
   THERAPIST PROGRESS NOTE  Session Time: 25min  Participation Level: Active  Behavioral Response: Neat and Well GroomedAlertDepressed  Type of Therapy: Individual Therapy  Treatment Goals addressed: Coping and Diagnosis: Anxiety  Interventions: CBT, Motivational Interviewing, Solution Focused, Strength-based, Supportive, Family Systems and Reframing  Summary: Michele Meyer is a 70 y.o. female who presents with continued symptoms of diagnosis.  Re-educated, explained and assisted the Patient on symptoms of her mental illness to assist her with recognizing and understanding her problematic behaviors.  Therapist requested Patient verbalize her struggles with inappropriate reactions toward others and assisted her with improving these interactions by engaging her in an activity working on continuing to develop social skills and develop interpersonal relationship skills. Therapist reinforced Patient's behavior by praising her for doing a good job working on Education administrator and applying them to her day to day interactions.    Suicidal/Homicidal: Nowithout intent/plan  Therapist Response: Provided support for Patient as she discussed her current mood. Stressed the importance of mood stabilization through learned coping skills and medication management.  Encouraged Patient to focus on her strengths and the positive aspects.  Plan: Return again in 2 weeks.  Diagnosis: Axis I: Generalized Anxiety Disorder    Axis II: No diagnosis    Lubertha South, LCSW 08/14/2016

## 2016-09-07 NOTE — Progress Notes (Signed)
   THERAPIST PROGRESS NOTE  Session Time: 52min  Participation Level: Active  Behavioral Response: Neat and Well GroomedAlertEuthymic  Type of Therapy: Individual Therapy  Treatment Goals addressed: Coping and Diagnosis: Depression/Anxiety  Interventions: CBT, Motivational Interviewing, Solution Focused, Supportive and Reframing  Summary: Michele Meyer is a 70 y.o. female who presents with continued symptoms of her diagnosis.  Patient continues to become stressed and anxious throughout the day.  She reports that she feels disrespected by her children at times. Patient continues to be frustrated with her financial situation and that she is unable to visit with her Son's stepchildren. Patient reports that her husband is short and irritable.  She reports that she wants to build a better relationship with him but is uncertain how to.  Patient was able to determine that she is minimal progress with her goals.  Patient did a good job providing feedback to assist with determining her progress on her goals.    Suicidal/Homicidal: No  Therapist Response: Therapist initiated today's visit by probing and eliciting information from Patient about her problematic behaviors to determine her progress on the goal. Therapist educated Patient on her diagnosis and the symptoms that she displays. Therapist questioned Patient about her perspective on whether or not she's made progress on her treatment goals.   Plan: Return again in2weeks.  Diagnosis: Axis I: Generalized Anxiety Disorder and Major Depression, Recurrent severe    Axis II: No diagnosis    Lubertha South, LCSW 08/29/2016

## 2016-09-11 ENCOUNTER — Ambulatory Visit: Payer: 59 | Admitting: Licensed Clinical Social Worker

## 2016-09-11 ENCOUNTER — Telehealth: Payer: Self-pay | Admitting: Psychiatry

## 2016-09-11 NOTE — Telephone Encounter (Signed)
Please advise pt to take him to the nearest ER as he is suicidal. He needs help.

## 2016-09-18 ENCOUNTER — Encounter (INDEPENDENT_AMBULATORY_CARE_PROVIDER_SITE_OTHER): Payer: Self-pay

## 2016-09-26 ENCOUNTER — Ambulatory Visit: Payer: 59 | Admitting: Licensed Clinical Social Worker

## 2016-09-27 DIAGNOSIS — R768 Other specified abnormal immunological findings in serum: Secondary | ICD-10-CM | POA: Diagnosis not present

## 2016-09-27 DIAGNOSIS — M5136 Other intervertebral disc degeneration, lumbar region: Secondary | ICD-10-CM | POA: Diagnosis not present

## 2016-09-27 DIAGNOSIS — M19041 Primary osteoarthritis, right hand: Secondary | ICD-10-CM | POA: Diagnosis not present

## 2016-09-27 DIAGNOSIS — M19042 Primary osteoarthritis, left hand: Secondary | ICD-10-CM | POA: Diagnosis not present

## 2016-10-03 ENCOUNTER — Encounter: Payer: Medicare Other | Admitting: Family Medicine

## 2016-10-12 ENCOUNTER — Ambulatory Visit (INDEPENDENT_AMBULATORY_CARE_PROVIDER_SITE_OTHER): Payer: Medicare Other | Admitting: Family Medicine

## 2016-10-12 ENCOUNTER — Other Ambulatory Visit: Payer: Self-pay | Admitting: Family Medicine

## 2016-10-12 ENCOUNTER — Encounter: Payer: Self-pay | Admitting: Family Medicine

## 2016-10-12 VITALS — BP 146/68 | HR 57 | Temp 98.5°F | Ht 62.3 in | Wt 137.9 lb

## 2016-10-12 DIAGNOSIS — F3341 Major depressive disorder, recurrent, in partial remission: Secondary | ICD-10-CM

## 2016-10-12 DIAGNOSIS — E871 Hypo-osmolality and hyponatremia: Secondary | ICD-10-CM

## 2016-10-12 DIAGNOSIS — H903 Sensorineural hearing loss, bilateral: Secondary | ICD-10-CM | POA: Diagnosis not present

## 2016-10-12 DIAGNOSIS — Z1211 Encounter for screening for malignant neoplasm of colon: Secondary | ICD-10-CM

## 2016-10-12 DIAGNOSIS — N3020 Other chronic cystitis without hematuria: Secondary | ICD-10-CM | POA: Diagnosis not present

## 2016-10-12 DIAGNOSIS — I10 Essential (primary) hypertension: Secondary | ICD-10-CM

## 2016-10-12 DIAGNOSIS — Z1231 Encounter for screening mammogram for malignant neoplasm of breast: Secondary | ICD-10-CM | POA: Diagnosis not present

## 2016-10-12 DIAGNOSIS — Z23 Encounter for immunization: Secondary | ICD-10-CM | POA: Diagnosis not present

## 2016-10-12 DIAGNOSIS — Z0000 Encounter for general adult medical examination without abnormal findings: Secondary | ICD-10-CM | POA: Diagnosis not present

## 2016-10-12 DIAGNOSIS — H6982 Other specified disorders of Eustachian tube, left ear: Secondary | ICD-10-CM | POA: Diagnosis not present

## 2016-10-12 DIAGNOSIS — E782 Mixed hyperlipidemia: Secondary | ICD-10-CM | POA: Diagnosis not present

## 2016-10-12 DIAGNOSIS — K219 Gastro-esophageal reflux disease without esophagitis: Secondary | ICD-10-CM | POA: Diagnosis not present

## 2016-10-12 DIAGNOSIS — Z1239 Encounter for other screening for malignant neoplasm of breast: Secondary | ICD-10-CM

## 2016-10-12 LAB — UA/M W/RFLX CULTURE, ROUTINE
BILIRUBIN UA: NEGATIVE
GLUCOSE, UA: NEGATIVE
KETONES UA: NEGATIVE
NITRITE UA: NEGATIVE
Protein, UA: NEGATIVE
Specific Gravity, UA: 1.01 (ref 1.005–1.030)
UUROB: 1 mg/dL (ref 0.2–1.0)
pH, UA: 6 (ref 5.0–7.5)

## 2016-10-12 LAB — LIPID PANEL PICCOLO, WAIVED
CHOLESTEROL PICCOLO, WAIVED: 236 mg/dL — AB (ref <200)
Chol/HDL Ratio Piccolo,Waive: 2.4 mg/dL
HDL Chol Piccolo, Waived: 98 mg/dL (ref >59)
LDL CHOL CALC PICCOLO WAIVED: 122 mg/dL — AB (ref <100)
Triglycerides Piccolo,Waived: 83 mg/dL (ref <150)
VLDL CHOL CALC PICCOLO,WAIVE: 17 mg/dL (ref <30)

## 2016-10-12 LAB — MICROALBUMIN, URINE WAIVED
Creatinine, Urine Waived: 50 mg/dL (ref 10–300)
Microalb, Ur Waived: 10 mg/L (ref 0–19)

## 2016-10-12 LAB — MICROSCOPIC EXAMINATION

## 2016-10-12 MED ORDER — METOPROLOL SUCCINATE ER 25 MG PO TB24: ORAL_TABLET | ORAL | 1 refills | Status: DC

## 2016-10-12 MED ORDER — PANTOPRAZOLE SODIUM 20 MG PO TBEC: DELAYED_RELEASE_TABLET | ORAL | 5 refills | Status: DC

## 2016-10-12 MED ORDER — VALSARTAN-HYDROCHLOROTHIAZIDE 160-25 MG PO TABS: 1.0000 | ORAL_TABLET | Freq: Every day | ORAL | 1 refills | Status: DC

## 2016-10-12 NOTE — Assessment & Plan Note (Signed)
Checking UA today, await results.

## 2016-10-12 NOTE — Assessment & Plan Note (Signed)
Rechecking levels today. Await results.  

## 2016-10-12 NOTE — Assessment & Plan Note (Signed)
- 

## 2016-10-12 NOTE — Assessment & Plan Note (Signed)
Stable. Continue current regimen. Continue to monitor. Call with any concerns.  

## 2016-10-12 NOTE — Assessment & Plan Note (Signed)
Under good control. Wants to come off her metoprolol, will check on how she's doing when stress is better and see if we can stop it.

## 2016-10-12 NOTE — Assessment & Plan Note (Signed)
Mammogram ordered today 

## 2016-10-12 NOTE — Assessment & Plan Note (Signed)
May want to start [place] writing her medications. Will discuss with psychiatry and let us know.

## 2016-10-12 NOTE — Assessment & Plan Note (Signed)
Preventative care discussed. Vaccines updated. Screening labs checked today. Mammogram, DEXA and Cologuard ordered. Continue diet and exercise.

## 2016-10-12 NOTE — Patient Instructions (Addendum)
Carotid Ultrasound: 10/16/16 4PM [facility]  Preventative Services:  Health Risk Assessment and Personalized Prevention Plan: Done today Bone Mass Measurements: up to date Breast Cancer Screening:ordered today CVD Screening: done today Cervical Cancer Screening: N/A Colon Cancer Screening:  Depression Screening: done today Diabetes Screening: done today Glaucoma Screening: see your eye doctor  Hepatitis B vaccine: N/A Hepatitis C screening: up to date HIV Screening: up to date Flu Vaccine: done today Lung cancer Screening: N/A Obesity Screening: done today Pneumonia Vaccines (2): up to date STI Screening: N/A Health Maintenance, Female Adopting a healthy lifestyle and getting preventive care can go a long way to promote health and wellness. Talk with your health care provider about what schedule of regular examinations is right for you. This is a good chance for you to check in with your provider about disease prevention and staying healthy. In between checkups, there are plenty of things you can do on your own. Experts have done a lot of research about which lifestyle changes and preventive measures are most likely to keep you healthy. Ask your health care provider for more information. WEIGHT AND DIET  Eat a healthy diet  Be sure to include plenty of vegetables, fruits, low-fat dairy products, and lean protein.  Do not eat a lot of foods high in solid fats, added sugars, or salt.  Get regular exercise. This is one of the most important things you can do for your health.  Most adults should exercise for at least 150 minutes each week. The exercise should increase your heart rate and make you sweat (moderate-intensity exercise).  Most adults should also do strengthening exercises at least twice a week. This is in addition to the moderate-intensity exercise.  Maintain a healthy weight  Body mass index (BMI) is a measurement that can be used to identify possible weight  problems. It estimates body fat based on height and weight. Your health care provider can help determine your BMI and help you achieve or maintain a healthy weight.  For females 41 years of age and older:   A BMI below 18.5 is considered underweight.  A BMI of 18.5 to 24.9 is normal.  A BMI of 25 to 29.9 is considered overweight.  A BMI of 30 and above is considered obese.  Watch levels of cholesterol and blood lipids  You should start having your blood tested for lipids and cholesterol at 72 years of age, then have this test every 5 years.  You may need to have your cholesterol levels checked more often if:  Your lipid or cholesterol levels are high.  You are older than 72 years of age.  You are at high risk for heart disease.  CANCER SCREENING   Lung Cancer  Lung cancer screening is recommended for adults 32-67 years old who are at high risk for lung cancer because of a history of smoking.  A yearly low-dose CT scan of the lungs is recommended for people who:  Currently smoke.  Have quit within the past 15 years.  Have at least a 30-pack-year history of smoking. A pack year is smoking an average of one pack of cigarettes a day for 1 year.  Yearly screening should continue until it has been 15 years since you quit.  Yearly screening should stop if you develop a health problem that would prevent you from having lung cancer treatment.  Breast Cancer  Practice breast self-awareness. This means understanding how your breasts normally appear and feel.  It also  means doing regular breast self-exams. Let your health care provider know about any changes, no matter how small.  If you are in your 20s or 30s, you should have a clinical breast exam (CBE) by a health care provider every 1-3 years as part of a regular health exam.  If you are 91 or older, have a CBE every year. Also consider having a breast X-ray (mammogram) every year.  If you have a family history of breast  cancer, talk to your health care provider about genetic screening.  If you are at high risk for breast cancer, talk to your health care provider about having an MRI and a mammogram every year.  Breast cancer gene (BRCA) assessment is recommended for women who have family members with BRCA-related cancers. BRCA-related cancers include:  Breast.  Ovarian.  Tubal.  Peritoneal cancers.  Results of the assessment will determine the need for genetic counseling and BRCA1 and BRCA2 testing. Cervical Cancer Your health care provider may recommend that you be screened regularly for cancer of the pelvic organs (ovaries, uterus, and vagina). This screening involves a pelvic examination, including checking for microscopic changes to the surface of your cervix (Pap test). You may be encouraged to have this screening done every 3 years, beginning at age 31.  For women ages 21-65, health care providers may recommend pelvic exams and Pap testing every 3 years, or they may recommend the Pap and pelvic exam, combined with testing for human papilloma virus (HPV), every 5 years. Some types of HPV increase your risk of cervical cancer. Testing for HPV may also be done on women of any age with unclear Pap test results.  Other health care providers may not recommend any screening for nonpregnant women who are considered low risk for pelvic cancer and who do not have symptoms. Ask your health care provider if a screening pelvic exam is right for you.  If you have had past treatment for cervical cancer or a condition that could lead to cancer, you need Pap tests and screening for cancer for at least 20 years after your treatment. If Pap tests have been discontinued, your risk factors (such as having a new sexual partner) need to be reassessed to determine if screening should resume. Some women have medical problems that increase the chance of getting cervical cancer. In these cases, your health care provider may  recommend more frequent screening and Pap tests. Colorectal Cancer  This type of cancer can be detected and often prevented.  Routine colorectal cancer screening usually begins at 72 years of age and continues through 72 years of age.  Your health care provider may recommend screening at an earlier age if you have risk factors for colon cancer.  Your health care provider may also recommend using home test kits to check for hidden blood in the stool.  A small camera at the end of a tube can be used to examine your colon directly (sigmoidoscopy or colonoscopy). This is done to check for the earliest forms of colorectal cancer.  Routine screening usually begins at age 6.  Direct examination of the colon should be repeated every 5-10 years through 72 years of age. However, you may need to be screened more often if early forms of precancerous polyps or small growths are found. Skin Cancer  Check your skin from head to toe regularly.  Tell your health care provider about any new moles or changes in moles, especially if there is a change in a mole's shape  or color.  Also tell your health care provider if you have a mole that is larger than the size of a pencil eraser.  Always use sunscreen. Apply sunscreen liberally and repeatedly throughout the day.  Protect yourself by wearing long sleeves, pants, a wide-brimmed hat, and sunglasses whenever you are outside. HEART DISEASE, DIABETES, AND HIGH BLOOD PRESSURE   High blood pressure causes heart disease and increases the risk of stroke. High blood pressure is more likely to develop in:  People who have blood pressure in the high end of the normal range (130-139/85-89 mm Hg).  People who are overweight or obese.  People who are African American.  If you are 52-61 years of age, have your blood pressure checked every 3-5 years. If you are 63 years of age or older, have your blood pressure checked every year. You should have your blood  pressure measured twice--once when you are at a hospital or clinic, and once when you are not at a hospital or clinic. Record the average of the two measurements. To check your blood pressure when you are not at a hospital or clinic, you can use:  An automated blood pressure machine at a pharmacy.  A home blood pressure monitor.  If you are between 31 years and 81 years old, ask your health care provider if you should take aspirin to prevent strokes.  Have regular diabetes screenings. This involves taking a blood sample to check your fasting blood sugar level.  If you are at a normal weight and have a low risk for diabetes, have this test once every three years after 72 years of age.  If you are overweight and have a high risk for diabetes, consider being tested at a younger age or more often. PREVENTING INFECTION  Hepatitis B  If you have a higher risk for hepatitis B, you should be screened for this virus. You are considered at high risk for hepatitis B if:  You were born in a country where hepatitis B is common. Ask your health care provider which countries are considered high risk.  Your parents were born in a high-risk country, and you have not been immunized against hepatitis B (hepatitis B vaccine).  You have HIV or AIDS.  You use needles to inject street drugs.  You live with someone who has hepatitis B.  You have had sex with someone who has hepatitis B.  You get hemodialysis treatment.  You take certain medicines for conditions, including cancer, organ transplantation, and autoimmune conditions. Hepatitis C  Blood testing is recommended for:  Everyone born from [birth year] through 1967.  Anyone with known risk factors for hepatitis C. Sexually transmitted infections (STIs)  You should be screened for sexually transmitted infections (STIs) including gonorrhea and chlamydia if:  You are sexually active and are younger than 72 years of age.  You are older than 72 years  of age and your health care provider tells you that you are at risk for this type of infection.  Your sexual activity has changed since you were last screened and you are at an increased risk for chlamydia or gonorrhea. Ask your health care provider if you are at risk.  If you do not have HIV, but are at risk, it may be recommended that you take a prescription medicine daily to prevent HIV infection. This is called pre-exposure prophylaxis (PrEP). You are considered at risk if:  You are sexually active and do not regularly use condoms or know the HIV  status of your partner(s).  You take drugs by injection.  You are sexually active with a partner who has HIV. Talk with your health care provider about whether you are at high risk of being infected with HIV. If you choose to begin PrEP, you should first be tested for HIV. You should then be tested every 3 months for as long as you are taking PrEP.  PREGNANCY   If you are premenopausal and you may become pregnant, ask your health care provider about preconception counseling.  If you may become pregnant, take 400 to 800 micrograms (mcg) of folic acid every day.  If you want to prevent pregnancy, talk to your health care provider about birth control (contraception). OSTEOPOROSIS AND MENOPAUSE   Osteoporosis is a disease in which the bones lose minerals and strength with aging. This can result in serious bone fractures. Your risk for osteoporosis can be identified using a bone density scan.  If you are 58 years of age or older, or if you are at risk for osteoporosis and fractures, ask your health care provider if you should be screened.  Ask your health care provider whether you should take a calcium or vitamin D supplement to lower your risk for osteoporosis.  Menopause may have certain physical symptoms and risks.  Hormone replacement therapy may reduce some of these symptoms and risks. Talk to your health care provider about whether hormone  replacement therapy is right for you.  HOME CARE INSTRUCTIONS   Schedule regular health, dental, and eye exams.  Stay current with your immunizations.   Do not use any tobacco products including cigarettes, chewing tobacco, or electronic cigarettes.  If you are pregnant, do not drink alcohol.  If you are breastfeeding, limit how much and how often you drink alcohol.  Limit alcohol intake to no more than 1 drink per day for nonpregnant women. One drink equals 12 ounces of beer, 5 ounces of wine, or 1 ounces of hard liquor.  Do not use street drugs.  Do not share needles.  Ask your health care provider for help if you need support or information about quitting drugs.  Tell your health care provider if you often feel depressed.  Tell your health care provider if you have ever been abused or do not feel safe at home.   This information is not intended to replace advice given to you by your health care provider. Make sure you discuss any questions you have with your health care provider.   Document Released: 06/25/2011 Document Revised: 12/31/2014 Document Reviewed: 11/11/2013 Elsevier Interactive Patient Education [employer]. Menopause is a normal process in which your reproductive ability comes to an end. This process happens gradually over a span of months to years, usually between the ages of 100 and 9. Menopause is complete when you have missed 12 consecutive menstrual periods. It is important to talk with your health care provider about some of the most common conditions that affect postmenopausal women, such as heart disease, cancer, and bone loss (osteoporosis). Adopting a healthy lifestyle and getting preventive care can help to promote your health and wellness. Those actions can also lower your chances of developing some of these common conditions. WHAT SHOULD I KNOW ABOUT MENOPAUSE? During menopause, you may experience a number of symptoms, such  as:  Moderate-to-severe hot flashes.  Night sweats.  Decrease in sex drive.  Mood swings.  Headaches.  Tiredness.  Irritability.  Memory problems.  Insomnia. Choosing to treat or not to  treat menopausal changes is an individual decision that you make with your health care provider. WHAT SHOULD I KNOW ABOUT HORMONE REPLACEMENT THERAPY AND SUPPLEMENTS? Hormone therapy products are effective for treating symptoms that are associated with menopause, such as hot flashes and night sweats. Hormone replacement carries certain risks, especially as you become older. If you are thinking about using estrogen or estrogen with progestin treatments, discuss the benefits and risks with your health care provider. WHAT SHOULD I KNOW ABOUT HEART DISEASE AND STROKE? Heart disease, heart attack, and stroke become more likely as you age. This may be due, in part, to the hormonal changes that your body experiences during menopause. These can affect how your body processes dietary fats, triglycerides, and cholesterol. Heart attack and stroke are both medical emergencies. There are many things that you can do to help prevent heart disease and stroke:  Have your blood pressure checked at least every 1-2 years. High blood pressure causes heart disease and increases the risk of stroke.  If you are 84-36 years old, ask your health care provider if you should take aspirin to prevent a heart attack or a stroke.  Do not use any tobacco products, including cigarettes, chewing tobacco, or electronic cigarettes. If you need help quitting, ask your health care provider.  It is important to eat a healthy diet and maintain a healthy weight.  Be sure to include plenty of vegetables, fruits, low-fat dairy products, and lean protein.  Avoid eating foods that are high in solid fats, added sugars, or salt (sodium).  Get regular exercise. This is one of the most important things that you can do for your health.  Try to  exercise for at least 150 minutes each week. The type of exercise that you do should increase your heart rate and make you sweat. This is known as moderate-intensity exercise.  Try to do strengthening exercises at least twice each week. Do these in addition to the moderate-intensity exercise.  Know your numbers.Ask your health care provider to check your cholesterol and your blood glucose. Continue to have your blood tested as directed by your health care provider. WHAT SHOULD I KNOW ABOUT CANCER SCREENING? There are several types of cancer. Take the following steps to reduce your risk and to catch any cancer development as early as possible. Breast Cancer  Practice breast self-awareness.  This means understanding how your breasts normally appear and feel.  It also means doing regular breast self-exams. Let your health care provider know about any changes, no matter how small.  If you are 7 or older, have a clinician do a breast exam (clinical breast exam or CBE) every year. Depending on your age, family history, and medical history, it may be recommended that you also have a yearly breast X-ray (mammogram).  If you have a family history of breast cancer, talk with your health care provider about genetic screening.  If you are at high risk for breast cancer, talk with your health care provider about having an MRI and a mammogram every year.  Breast cancer (BRCA) gene test is recommended for women who have family members with BRCA-related cancers. Results of the assessment will determine the need for genetic counseling and BRCA1 and for BRCA2 testing. BRCA-related cancers include these types:  Breast. This occurs in males or females.  Ovarian.  Tubal. This may also be called fallopian tube cancer.  Cancer of the abdominal or pelvic lining (peritoneal cancer).  Prostate.  Pancreatic. Cervical, Uterine, and Ovarian Cancer  Your health care provider may recommend that you be screened  regularly for cancer of the pelvic organs. These include your ovaries, uterus, and vagina. This screening involves a pelvic exam, which includes checking for microscopic changes to the surface of your cervix (Pap test).  For women ages 21-65, health care providers may recommend a pelvic exam and a Pap test every three years. For women ages 2-65, they may recommend the Pap test and pelvic exam, combined with testing for human papilloma virus (HPV), every five years. Some types of HPV increase your risk of cervical cancer. Testing for HPV may also be done on women of any age who have unclear Pap test results.  Other health care providers may not recommend any screening for nonpregnant women who are considered low risk for pelvic cancer and have no symptoms. Ask your health care provider if a screening pelvic exam is right for you.  If you have had past treatment for cervical cancer or a condition that could lead to cancer, you need Pap tests and screening for cancer for at least 20 years after your treatment. If Pap tests have been discontinued for you, your risk factors (such as having a new sexual partner) need to be reassessed to determine if you should start having screenings again. Some women have medical problems that increase the chance of getting cervical cancer. In these cases, your health care provider may recommend that you have screening and Pap tests more often.  If you have a family history of uterine cancer or ovarian cancer, talk with your health care provider about genetic screening.  If you have vaginal bleeding after reaching menopause, tell your health care provider.  There are currently no reliable tests available to screen for ovarian cancer. Lung Cancer Lung cancer screening is recommended for adults 64-28 years old who are at high risk for lung cancer because of a history of smoking. A yearly low-dose CT scan of the lungs is recommended if you:  Currently smoke.  Have a  history of at least 30 pack-years of smoking and you currently smoke or have quit within the past 15 years. A pack-year is smoking an average of one pack of cigarettes per day for one year. Yearly screening should:  Continue until it has been 15 years since you quit.  Stop if you develop a health problem that would prevent you from having lung cancer treatment. Colorectal Cancer  This type of cancer can be detected and can often be prevented.  Routine colorectal cancer screening usually begins at age 53 and continues through age 2.  If you have risk factors for colon cancer, your health care provider may recommend that you be screened at an earlier age.  If you have a family history of colorectal cancer, talk with your health care provider about genetic screening.  Your health care provider may also recommend using home test kits to check for hidden blood in your stool.  A small camera at the end of a tube can be used to examine your colon directly (sigmoidoscopy or colonoscopy). This is done to check for the earliest forms of colorectal cancer.  Direct examination of the colon should be repeated every 5-10 years until age 52. However, if early forms of precancerous polyps or small growths are found or if you have a family history or genetic risk for colorectal cancer, you may need to be screened more often. Skin Cancer  Check your skin from head to toe regularly.  Monitor any  moles. Be sure to tell your health care provider:  About any new moles or changes in moles, especially if there is a change in a mole's shape or color.  If you have a mole that is larger than the size of a pencil eraser.  If any of your family members has a history of skin cancer, especially at a young age, talk with your health care provider about genetic screening.  Always use sunscreen. Apply sunscreen liberally and repeatedly throughout the day.  Whenever you are outside, protect yourself by wearing long  sleeves, pants, a wide-brimmed hat, and sunglasses. WHAT SHOULD I KNOW ABOUT OSTEOPOROSIS? Osteoporosis is a condition in which bone destruction happens more quickly than new bone creation. After menopause, you may be at an increased risk for osteoporosis. To help prevent osteoporosis or the bone fractures that can happen because of osteoporosis, the following is recommended:  If you are 68-47 years old, get at least 1,000 mg of calcium and at least 600 mg of vitamin D per day.  If you are older than age 25 but younger than age 51, get at least 1,200 mg of calcium and at least 600 mg of vitamin D per day.  If you are older than age 44, get at least 1,200 mg of calcium and at least 800 mg of vitamin D per day. Smoking and excessive alcohol intake increase the risk of osteoporosis. Eat foods that are rich in calcium and vitamin D, and do weight-bearing exercises several times each week as directed by your health care provider. WHAT SHOULD I KNOW ABOUT HOW MENOPAUSE AFFECTS [place]? Depression may occur at any age, but it is more common as you become older. Common symptoms of depression include:  Low or sad mood.  Changes in sleep patterns.  Changes in appetite or eating patterns.  Feeling an overall lack of motivation or enjoyment of activities that you previously enjoyed.  Frequent crying spells. Talk with your health care provider if you think that you are experiencing depression. WHAT SHOULD I KNOW ABOUT IMMUNIZATIONS? It is important that you get and maintain your immunizations. These include:  Tetanus, diphtheria, and pertussis (Tdap) booster vaccine.  Influenza every year before the flu season begins.  Pneumonia vaccine.  Shingles vaccine. Your health care provider may also recommend other immunizations.   This information is not intended to replace advice given to you by your health care provider. Make sure you discuss any questions you have with your health care  provider.   Document Released: 02/01/2006 Document Revised: 12/31/2014 Document Reviewed: 08/12/2014 Elsevier Interactive Patient Education [employer].

## 2016-10-12 NOTE — Progress Notes (Signed)
BP (!) 146/68 (BP Location: Left Arm, Patient Position: Sitting, Cuff Size: Normal)   Pulse (!) 57   Temp 98.5 F (36.9 C)   Ht 5' 2.3" (1.582 m)   Wt 137 lb 14.4 oz (62.6 kg)   SpO2 100%   BMI 24.98 kg/m    Subjective:    Patient ID: Michele Meyer, female    DOB: [birth year], 70 y.o.   MRN: [id number]  HPI: Michele Meyer is a 70 y.o. female presenting on 10/12/2016 for comprehensive medical examination. Current medical complaints include:  HYPERTENSION Hypertension status: controlled  Satisfied with current treatment? yes Duration of hypertension: chronic BP monitoring frequency:  not checking BP medication side effects:  no Medication compliance: excellent compliance Aspirin: no Recurrent headaches: no Visual changes: no Palpitations: no Dyspnea: no Chest pain: no Lower extremity edema: no Dizzy/lightheaded: no  She currently lives with: husband Menopausal Symptoms: no  Functional Status Survey: Is the patient deaf or have difficulty hearing?: Yes Does the patient have difficulty seeing, even when wearing glasses/contacts?: No Does the patient have difficulty concentrating, remembering, or making decisions?: No Does the patient have difficulty walking or climbing stairs?: No Does the patient have difficulty dressing or bathing?: No Does the patient have difficulty doing errands alone such as visiting a doctor's office or shopping?: No  Fall Risk  10/12/2016 07/03/2016 01/04/2015 01/29/2013  Falls in the past year? No No No No   Depression Screen Depression screen [facility] 2/9 10/12/2016 07/03/2016 02/27/2016 01/04/2015 01/29/2013  Decreased Interest 0 0 - 0 0  Down, Depressed, Hopeless 0 0 1 0 0  PHQ - 2 Score 0 0 1 0 0    Advanced Directives Does patient have a HCPOA?    no Does patient have a living will or MOST form?  no  Past Medical History:  Past Medical History:  Diagnosis Date  . Anxiety   . Arthritis   . Bladder filling defect    bladder sling  protrusing last 3 years   . Chronic cystitis   . Cyst of right kidney   . Cystocele   . Depression   . Diverticulosis   . Dyslipidemia   . Endometriosis   . Gestational diabetes mellitus 30 years ago   with pregnancy   . Glaucoma    both eyes  . Incomplete bladder emptying   . Labile hypertension   . PONV (postoperative nausea and vomiting)   . Skin cancer   . Skin cancer    basal and squamous cell  . Stress incontinence   . Uterovaginal prolapse, incomplete   . UTI (lower urinary tract infection)     Surgical History:  Past Surgical History:  Procedure Laterality Date  . ABDOMINAL HYSTERECTOMY     complete  . ANTERIOR AND POSTERIOR VAGINAL REPAIR    . APPENDECTOMY    . BREAST BIOPSY    . BREAST LUMPECTOMY     benign  . prolapsed bladder     Repair Dr.Cope  . PUBOVAGINAL SLING  4 years ago   protrusion of bladder sling for last 3 years  . removal of first rib     bilaterally  . TONSILLECTOMY    . TOTAL KNEE ARTHROPLASTY Right 10/03/2015   Procedure: RIGHT TOTAL KNEE ARTHROPLASTY;  Surgeon: Gaynelle Arabian, MD;  Location: [facility];  Service: Orthopedics;  Laterality: Right;  . VEIN LIGATION AND STRIPPING Bilateral     Medications:  Current Outpatient Prescriptions on File Prior to Visit  Medication  Sig  . latanoprost (XALATAN) 0.005 % ophthalmic solution Place 1 drop into both eyes at bedtime.   . sertraline (ZOLOFT) 100 MG tablet Take 1.5 tablets (150 mg total) by mouth daily.  . traZODone (DESYREL) 50 MG tablet Take 1 tablet (50 mg total) by mouth at bedtime.  Marland Kitchen nystatin (MYCOSTATIN) powder APPLY TO AFFECTED AREA 3 TIMES DAILY (Patient not taking: Reported on 10/12/2016)   No current facility-administered medications on file prior to visit.     Allergies:  Allergies  Allergen Reactions  . Ace Inhibitors     cough  . Codeine Nausea And Vomiting  . Erythromycin Diarrhea  . Prednisone Other (See Comments)    Headache, GI upset, can take if necessary  . Latex      Sensitive to it but not allergic  . Penicillins Rash    Has patient had a PCN reaction causing immediate rash, facial/tongue/throat swelling, SOB or lightheadedness with hypotension: No Has patient had a PCN reaction causing severe rash involving mucus membranes or skin necrosis: No Has patient had a PCN reaction that required hospitalization No Has patient had a PCN reaction occurring within the last 10 years: No If all of the above answers are "NO", then may proceed with Cephalosporin use.    Social History:  Social History   Social History  . Marital status: Married    Spouse name: N/A  . Number of children: 5  . Years of education: N/A   Occupational History  .  Retired   Social History Main Topics  . Smoking status: Former Smoker    Quit date: 02/25/1964  . Smokeless tobacco: Never Used     Comment: smoked for two months  . Alcohol use Yes     Comment: rarely  . Drug use: No  . Sexual activity: Not Currently   Other Topics Concern  . Not on file   Social History Narrative   Married, mother of 33, with at least one granddaughter who is present today.   Daily Caffeine Use:  2 cups in am;   She does not exercise routinely. Former smoker who quit in 1965.   Takes occasional alcohol beverage.   As the name is [facility] granddaughter's name is Earnestine Ciano   History  Smoking Status  . Former Smoker  . Quit date: 02/25/1964  Smokeless Tobacco  . Never Used    Comment: smoked for two months   History  Alcohol Use  . Yes    Comment: rarely    Family History:  Family History  Problem Relation Age of Onset  . Diabetes Mother   . Hypertension Mother   . Cervical cancer Mother   . Skin cancer Mother   . Hypercholesterolemia Mother   . Heart disease Mother   . Kidney disease Mother   . Depression Father   . Hypertension Father   . Hypercholesterolemia Father   . Colon cancer Neg Hx     Past medical history, surgical history, medications, allergies, family  history and social history reviewed with patient today and changes made to appropriate areas of the chart.   Review of Systems  Constitutional: Negative.   HENT: Positive for ear pain (bilateral, spasmy pain). Negative for congestion, ear discharge, hearing loss, nosebleeds, sore throat and tinnitus.   Eyes: Negative.   Respiratory: Negative.  Negative for stridor.   Cardiovascular: Negative.   Gastrointestinal: Positive for heartburn (well controlled with OTC medication). Negative for abdominal pain, blood in stool, constipation, diarrhea, melena,  nausea and vomiting.  Genitourinary: Negative.   Musculoskeletal: Negative.   Skin: Positive for rash (about a week ago, went away). Negative for itching.  Neurological: Negative.  Negative for headaches.  Endo/Heme/Allergies: Negative for environmental allergies and polydipsia. Bruises/bleeds easily.  Psychiatric/Behavioral: Positive for depression. Negative for hallucinations, memory loss, substance abuse and suicidal ideas. The patient is nervous/anxious. The patient does not have insomnia.    All other ROS negative except what is listed above and in the HPI.      Objective:    BP (!) 146/68 (BP Location: Left Arm, Patient Position: Sitting, Cuff Size: Normal)   Pulse (!) 57   Temp 98.5 F (36.9 C)   Ht 5' 2.3" (1.582 m)   Wt 137 lb 14.4 oz (62.6 kg)   SpO2 100%   BMI 24.98 kg/m   Wt Readings from Last 3 Encounters:  10/12/16 137 lb 14.4 oz (62.6 kg)  08/15/16 138 lb 12.8 oz (63 kg)  07/03/16 138 lb (62.6 kg)     Hearing Screening   125Hz  250Hz  500Hz  1000Hz  2000Hz  3000Hz  4000Hz  6000Hz  8000Hz   Right ear:   Fail Fail Fail  40    Left ear:   Fail Fail Fail  Fail      Visual Acuity Screening   Right eye Left eye Both eyes  Without correction:     With correction: 20/40 20/40 20/25     Physical Exam  Constitutional: She is oriented to person, place, and time. She appears well-developed and well-nourished. No distress.  HENT:    Head: Normocephalic and atraumatic.  Right Ear: Hearing and external ear normal.  Left Ear: Hearing and external ear normal.  Nose: Nose normal.  Mouth/Throat: Oropharynx is clear and moist. No oropharyngeal exudate.  Eyes: Conjunctivae, EOM and lids are normal. Pupils are equal, round, and reactive to light. Right eye exhibits no discharge. Left eye exhibits no discharge. No scleral icterus.  Neck: Normal range of motion. Neck supple. No JVD present. No tracheal deviation present. No thyromegaly present.  Cardiovascular: Normal rate, regular rhythm, normal heart sounds and intact distal pulses.  Exam reveals no gallop and no friction rub.   No murmur heard. Pulmonary/Chest: Effort normal and breath sounds normal. No stridor. No respiratory distress. She has no wheezes. She has no rales. She exhibits no tenderness.  Abdominal: Soft. Bowel sounds are normal. She exhibits no distension and no mass. There is no tenderness. There is no rebound and no guarding.  Genitourinary:  Genitourinary Comments: Breast and GYN exams deferred with shared decision making.   Musculoskeletal: Normal range of motion. She exhibits no edema, tenderness or deformity.  Lymphadenopathy:    She has no cervical adenopathy.  Neurological: She is alert and oriented to person, place, and time. She has normal reflexes. She displays normal reflexes. No cranial nerve deficit. She exhibits normal muscle tone. Coordination normal.  Skin: Skin is warm, dry and intact. No rash noted. She is not diaphoretic. No erythema. No pallor.  Psychiatric: She has a normal mood and affect. Her speech is normal and behavior is normal. Judgment and thought content normal. Cognition and memory are normal.  Nursing note and vitals reviewed.   6CIT Screen 10/12/2016  What Year? 0 points  What month? 0 points  What time? 0 points  Count back from 20 0 points  Months in reverse 0 points  Repeat phrase 4 points  Total Score 4     Results  for orders placed or performed in visit on  02/27/16  Microalbumin, Urine Waived  Result Value Ref Range   Microalb, Ur Waived 10 0 - 19 mg/L   Creatinine, Urine Waived 50 10 - 300 mg/dL   Microalb/Creat Ratio <30 <30 mg/g  Comprehensive metabolic panel  Result Value Ref Range   Glucose 87 65 - 99 mg/dL   BUN 11 8 - 27 mg/dL   Creatinine, Ser 0.81 0.57 - 1.00 mg/dL   GFR calc non Af Amer 73 >59 mL/min/1.73   GFR calc Af Amer 85 >59 mL/min/1.73   BUN/Creatinine Ratio 14 11 - 26   Sodium 129 (L) 134 - 144 mmol/L   Potassium 4.4 3.5 - 5.2 mmol/L   Chloride 87 (L) 96 - 106 mmol/L   CO2 27 18 - 29 mmol/L   Calcium 9.4 8.7 - 10.3 mg/dL   Total Protein 6.9 6.0 - 8.5 g/dL   Albumin 4.3 3.5 - 4.8 g/dL   Globulin, Total 2.6 1.5 - 4.5 g/dL   Albumin/Globulin Ratio 1.7 1.1 - 2.5   Bilirubin Total 0.3 0.0 - 1.2 mg/dL   Alkaline Phosphatase 105 39 - 117 IU/L   AST 17 0 - 40 IU/L   ALT 16 0 - 32 IU/L  Lipid Panel Piccolo, Waived  Result Value Ref Range   Cholesterol Piccolo, Waived WILL FOLLOW    HDL Chol Piccolo, Waived CANCELED    Triglycerides Piccolo,Waived CANCELED   UA/M w/rflx Culture, Routine  Result Value Ref Range   Specific Gravity, UA 1.010 1.005 - 1.030   pH, UA 6.0 5.0 - 7.5   Color, UA Yellow Yellow   Appearance Ur Clear Clear   Leukocytes, UA Negative Negative   Protein, UA Negative Negative/Trace   Glucose, UA Negative Negative   Ketones, UA Negative Negative   RBC, UA Trace (A) Negative   Bilirubin, UA Negative Negative   Urobilinogen, Ur 0.2 0.2 - 1.0 mg/dL   Nitrite, UA Negative Negative  Lyme Ab/Western Blot Reflex  Result Value Ref Range   Lyme IgG/IgM Ab <0.91 0.00 - 0.90 ISR   LYME DISEASE AB, QUANT, IGM <0.80 0.00 - 0.79 index  Rocky mtn spotted fvr abs pnl(IgG+IgM)  Result Value Ref Range   RMSF IgG Negative Negative   RMSF IgM 0.45 0.00 - 0.89 index  Babesia microti Antibody Panel  Result Value Ref Range   Babesia microti IgM <1:10 Neg:<1:10    Babesia microti IgG [id number]  Ehrlichia Antibody Panel  Result Value Ref Range   E.Chaffeensis (HME) IgG Negative Neg:<1:64   E. Chaffeensis (HME) IgM Titer Negative Neg:<1:20   HGE IgG Titer Negative Neg:<1:64   HGE IgM Titer Negative Neg:<1:20  Antinuclear Antib (ANA)  Result Value Ref Range   Anit Nuclear Antibody(ANA) Negative Negative  Rheumatoid Factor  Result Value Ref Range   Rhuematoid fact SerPl-aCnc 70.3 (H) 0.0 - 13.9 IU/mL  CBC With Differential/Platelet  Result Value Ref Range   WBC 7.1 3.4 - 10.8 x10E3/uL   RBC 4.15 3.77 - 5.28 x10E6/uL   Hemoglobin 12.3 11.1 - 15.9 g/dL   Hematocrit 33.5 (L) 34.0 - 46.6 %   MCV 81 79 - 97 fL   MCH 30.0 26.6 - 33.0 pg   MCHC 37.0 (H) 31.5 - 35.7 g/dL   RDW 40.0 (H) 12.3 - 15.4 %   Platelets 246 150 - 379 x10E3/uL   Neutrophils 78 %   Lymphs 13 %   MID 9 %   Neutrophils Absolute 6.0 1.4 - 7.0 x10E3/uL   Lymphocytes  Absolute 1.0 0.7 - 3.1 x10E3/uL   MID (Absolute) 1.0 0.1 - 1.6 X10E3/uL  Specimen status report  Result Value Ref Range   specimen status report Comment       Assessment & Plan:   Problem List Items Addressed This Visit      Cardiovascular and Mediastinum   Hypertension (Chronic)    Under good control. Wants to come off her metoprolol, will check on how she's doing when stress is better and see if we can stop it.       Relevant Medications   valsartan-hydrochlorothiazide (DIOVAN-HCT) 160-25 MG tablet   metoprolol succinate (TOPROL-XL) 25 MG 24 hr tablet     Digestive   GERD (gastroesophageal reflux disease)    Stable. Continue current regimen. Continue to monitor. Call with any concerns.       Relevant Medications   pantoprazole (PROTONIX) 20 MG tablet     Nervous and Auditory   ETD (eustachian tube dysfunction)    Continue to follow with ENT.      Hearing loss    Continue to follow with ENT.        Genitourinary   Bladder infection, chronic    Checking UA today, await results.           Other   Hyperlipidemia (Chronic)    Rechecking levels today. Await results.      Relevant Medications   valsartan-hydrochlorothiazide (DIOVAN-HCT) 160-25 MG tablet   metoprolol succinate (TOPROL-XL) 25 MG 24 hr tablet   Screening for breast cancer    Mammogram ordered today      Relevant Orders   MM DIGITAL SCREENING BILATERAL   Depression    May want to start [place] writing her medications. Will discuss with psychiatry and let us know.       Medicare annual wellness visit, subsequent - Primary    Preventative care discussed. Vaccines updated. Screening labs checked today. Mammogram, DEXA and Cologuard ordered. Continue diet and exercise.      Hyponatremia    Rechecking levels today. Await results.       Other Visit Diagnoses    Immunization due       Flu shot given today. Rx for shingles vaccine given today.   Relevant Orders   Flu vaccine HIGH DOSE PF (Fluzone High dose) (Completed)   Screening for colon cancer       Will do cologuard. Ordered today   Relevant Orders   Cologuard      Preventative Services:  Health Risk Assessment and Personalized Prevention Plan: Done today Bone Mass Measurements: up to date Breast Cancer Screening:ordered today CVD Screening: done today Cervical Cancer Screening: N/A Colon Cancer Screening: Will do cologuard Depression Screening: done today Diabetes Screening: done today Glaucoma Screening: see your eye doctor  Hepatitis B vaccine: N/A Hepatitis C screening: up to date HIV Screening: up to date Flu Vaccine: done today Lung cancer Screening: N/A Obesity Screening: done today Pneumonia Vaccines (2): up to date STI Screening: N/A  Follow up plan: Return in about 6 months (around 04/12/2017) for Follow up BP .   LABORATORY TESTING:  - Pap smear: not applicable  IMMUNIZATIONS:   - Tdap: Tetanus vaccination status reviewed: last tetanus booster within 10 years. - Influenza: Administered today - Pneumovax: Up to date -  Prevnar: Up to date - Zostavax vaccine: Can get at the pharmacy  SCREENING: -Mammogram: Ordered today  - Colonoscopy:  Will do cologuard - Bone Density: Ordered today  -Hearing Test: Ordered today  -  Spirometry: Not applicable   PATIENT COUNSELING:   Advised to take 1 mg of folate supplement per day if capable of pregnancy.   Sexuality: Discussed sexually transmitted diseases, partner selection, use of condoms, avoidance of unintended pregnancy  and contraceptive alternatives.   Advised to avoid cigarette smoking.  I discussed with the patient that most people either abstain from alcohol or drink within safe limits (<=14/week and <=4 drinks/occasion for males, <=7/weeks and <= 3 drinks/occasion for females) and that the risk for alcohol disorders and other health effects rises proportionally with the number of drinks per week and how often a drinker exceeds daily limits.  Discussed cessation/primary prevention of drug use and availability of treatment for abuse.   Diet: Encouraged to adjust caloric intake to maintain  or achieve ideal body weight, to reduce intake of dietary saturated fat and total fat, to limit sodium intake by avoiding high sodium foods and not adding table salt, and to maintain adequate dietary potassium and calcium preferably from fresh fruits, vegetables, and low-fat dairy products.    stressed the importance of regular exercise  Injury prevention: Discussed safety belts, safety helmets, smoke detector, smoking near bedding or upholstery.   Dental health: Discussed importance of regular tooth brushing, flossing, and dental visits.    NEXT PREVENTATIVE PHYSICAL DUE IN 1 YEAR. Return in about 6 months (around 04/12/2017) for Follow up BP .

## 2016-10-13 LAB — CBC WITH DIFFERENTIAL/PLATELET
Basophils Absolute: 0 10*3/uL (ref 0.0–0.2)
Basos: 1 %
EOS (ABSOLUTE): 0.5 10*3/uL — ABNORMAL HIGH (ref 0.0–0.4)
EOS: 6 %
HEMATOCRIT: 35.6 % (ref 34.0–46.6)
HEMOGLOBIN: 12.4 g/dL (ref 11.1–15.9)
IMMATURE GRANULOCYTES: 0 %
Immature Grans (Abs): 0 10*3/uL (ref 0.0–0.1)
Lymphocytes Absolute: 1.4 10*3/uL (ref 0.7–3.1)
Lymphs: 19 %
MCH: 29.2 pg (ref 26.6–33.0)
MCHC: 34.8 g/dL (ref 31.5–35.7)
MCV: 84 fL (ref 79–97)
MONOCYTES: 9 %
Monocytes Absolute: 0.6 10*3/uL (ref 0.1–0.9)
NEUTROS PCT: 65 %
Neutrophils Absolute: 4.6 10*3/uL (ref 1.4–7.0)
Platelets: 249 10*3/uL (ref 150–379)
RBC: 4.24 x10E6/uL (ref 3.77–5.28)
RDW: 13.1 % (ref 12.3–15.4)
WBC: 7 10*3/uL (ref 3.4–10.8)

## 2016-10-13 LAB — COMPREHENSIVE METABOLIC PANEL
ALBUMIN: 4.5 g/dL (ref 3.5–4.8)
ALT: 15 IU/L (ref 0–32)
AST: 19 IU/L (ref 0–40)
Albumin/Globulin Ratio: 1.6 (ref 1.2–2.2)
Alkaline Phosphatase: 94 IU/L (ref 39–117)
BUN/Creatinine Ratio: 22 (ref 12–28)
BUN: 16 mg/dL (ref 8–27)
Bilirubin Total: 0.5 mg/dL (ref 0.0–1.2)
CALCIUM: 9.9 mg/dL (ref 8.7–10.3)
CO2: 28 mmol/L (ref 18–29)
CREATININE: 0.73 mg/dL (ref 0.57–1.00)
Chloride: 91 mmol/L — ABNORMAL LOW (ref 96–106)
GFR calc Af Amer: 96 mL/min/{1.73_m2} (ref >59)
GFR, EST NON AFRICAN AMERICAN: 83 mL/min/{1.73_m2} (ref >59)
GLOBULIN, TOTAL: 2.8 g/dL (ref 1.5–4.5)
Glucose: 88 mg/dL (ref 65–99)
Potassium: 4.7 mmol/L (ref 3.5–5.2)
SODIUM: 135 mmol/L (ref 134–144)
Total Protein: 7.3 g/dL (ref 6.0–8.5)

## 2016-10-13 LAB — TSH: TSH: 1.37 u[IU]/mL (ref 0.450–4.500)

## 2016-10-15 ENCOUNTER — Encounter: Payer: Self-pay | Admitting: Psychiatry

## 2016-10-15 ENCOUNTER — Encounter: Payer: Self-pay | Admitting: Family Medicine

## 2016-10-15 ENCOUNTER — Ambulatory Visit (INDEPENDENT_AMBULATORY_CARE_PROVIDER_SITE_OTHER): Payer: 59 | Admitting: Psychiatry

## 2016-10-15 VITALS — BP 156/74 | HR 71 | Temp 98.4°F | Wt 139.0 lb

## 2016-10-15 DIAGNOSIS — F331 Major depressive disorder, recurrent, moderate: Secondary | ICD-10-CM

## 2016-10-15 DIAGNOSIS — F411 Generalized anxiety disorder: Secondary | ICD-10-CM

## 2016-10-15 MED ORDER — TRAZODONE HCL 50 MG PO TABS: 50.0000 mg | ORAL_TABLET | Freq: Every day | ORAL | 2 refills | Status: DC

## 2016-10-15 MED ORDER — SERTRALINE HCL 100 MG PO TABS: 150.0000 mg | ORAL_TABLET | Freq: Every day | ORAL | 1 refills | Status: DC

## 2016-10-15 NOTE — Progress Notes (Signed)
BH MD/PA/NP OP Progress Note  10/15/2016 11:18 AM Michele Meyer  MRN:  [id number]  Subjective:    Patient is  a 70 year old married female who presented for the follow-up appointment. She came late for her appointment as it was raining outside. Patient continues to be worried about her son and his social issues. She reported that they are having legal trial related to the adopted children. Patient stated that she has not seen that doctor children since May. She feels depressed about the same. She has been supporting her son at this time. She currently denied having any side effects of the medication. She reported that she has been driving her son as they have only one car at this time. She will cope back to pick him up. She also received her influenza injection over the weekend and is currently experiencing side effects. She feels tired and has been having body aches. She reported that she sleeps well with the help of trazodone. She currently denied having any suicidal ideations or plans. She appears well worse during the interview. We discussed about her medications and about her symptoms in detail. She appeared calm and alert during the interview.  . Patient reported that she is continuing her therapy on a regular basis and it is helpful. She denied having any mood swings anger anxiety or paranoia.     Chief Complaint:  Chief Complaint    Follow-up; Medication Refill     Visit Diagnosis:     ICD-9-CM ICD-10-CM   1. MDD (major depressive disorder), recurrent episode, moderate (HCC) 296.32 F33.1   2. GAD (generalized anxiety disorder) 300.02 F41.1     Past Medical History:  Past Medical History:  Diagnosis Date  . Anxiety   . Arthritis   . Bladder filling defect    bladder sling protrusing last 3 years   . Chronic cystitis   . Cyst of right kidney   . Cystocele   . Depression   . Diverticulosis   . Dyslipidemia   . Endometriosis   . Gestational diabetes mellitus 30 years ago   with pregnancy   . Glaucoma    both eyes  . Incomplete bladder emptying   . Labile hypertension   . PONV (postoperative nausea and vomiting)   . Skin cancer   . Skin cancer    basal and squamous cell  . Stress incontinence   . Uterovaginal prolapse, incomplete   . UTI (lower urinary tract infection)     Past Surgical History:  Procedure Laterality Date  . ABDOMINAL HYSTERECTOMY     complete  . ANTERIOR AND POSTERIOR VAGINAL REPAIR    . APPENDECTOMY    . BREAST BIOPSY    . BREAST LUMPECTOMY     benign  . prolapsed bladder     Repair Dr.Cope  . PUBOVAGINAL SLING  4 years ago   protrusion of bladder sling for last 3 years  . removal of first rib     bilaterally  . TONSILLECTOMY    . TOTAL KNEE ARTHROPLASTY Right 10/03/2015   Procedure: RIGHT TOTAL KNEE ARTHROPLASTY;  Surgeon: Gaynelle Arabian, MD;  Location: [facility];  Service: Orthopedics;  Laterality: Right;  . VEIN LIGATION AND STRIPPING Bilateral    Family History:  Family History  Problem Relation Age of Onset  . Diabetes Mother   . Hypertension Mother   . Cervical cancer Mother   . Skin cancer Mother   . Hypercholesterolemia Mother   . Heart disease Mother   .  Kidney disease Mother   . Depression Father   . Hypertension Father   . Hypercholesterolemia Father   . Colon cancer Neg Hx    Social History:  Social History   Social History  . Marital status: Married    Spouse name: N/A  . Number of children: 5  . Years of education: N/A   Occupational History  .  Retired   Social History Main Topics  . Smoking status: Former Smoker    Quit date: 02/25/1964  . Smokeless tobacco: Never Used     Comment: smoked for two months  . Alcohol use Yes     Comment: rarely  . Drug use: No  . Sexual activity: Not Currently   Other Topics Concern  . None   Social History Narrative   Married, mother of 7, with at least one granddaughter who is present today.   Daily Caffeine Use:  2 cups in am;   She does not  exercise routinely. Former smoker who quit in 1965.   Takes occasional alcohol beverage.   As the name is [place] granddaughter's name is Satonya Jessie   Additional History:  Lives with husband.   Assessment:   Musculoskeletal: Strength & Muscle Tone: within normal limits Gait & Station: normal Patient leans: N/A  Psychiatric Specialty Exam: Anxiety  Symptoms include insomnia and nervous/anxious behavior.    Depression         Associated symptoms include insomnia.  Past medical history includes anxiety.   Medication Refill  Pertinent negatives include no chills.    Review of Systems  Constitutional: Negative.  Negative for chills.  HENT: Negative.   Eyes: Negative.   Respiratory: Negative.   Gastrointestinal: Negative.   Genitourinary: Negative.   Musculoskeletal: Positive for back pain and joint pain.  Skin: Negative.   Neurological: Negative.   Endo/Heme/Allergies: Negative.   Psychiatric/Behavioral: Positive for depression. The patient is nervous/anxious and has insomnia.     Blood pressure (!) 156/74, pulse 71, temperature 98.4 F (36.9 C), temperature source Oral, weight 139 lb (63 kg).Body mass index is 25.18 kg/m.  General Appearance: Casual  Eye Contact:  Fair  Speech:  Clear and Coherent  Volume:  Normal  Mood:  Anxious  Affect:  Congruent  Thought Process:  Coherent  Orientation:  Full (Time, Place, and Person)  Thought Content:  WDL  Suicidal Thoughts:  No  Homicidal Thoughts:  No  Memory:  NA  Judgement:  Fair  Insight:  Fair  Psychomotor Activity:  Normal  Concentration:  Fair  Recall:  [employer] of Knowledge: Fair  Language: Fair  Akathisia:  No  Handed:  Right  AIMS (if indicated):  none  Assets:  Communication Skills Desire for Improvement Social Support  ADL's:  Intact  Cognition: WNL  Sleep:  Not much 4-5 hours    Is the patient at risk to self?  No. Has the patient been a risk to self in the past 6 months?  No. Has the patient  been a risk to self within the distant past?  No. Is the patient a risk to others?  No. Has the patient been a risk to others in the past 6 months?  No. Has the patient been a risk to others within the distant past?  No.  Current Medications: Current Outpatient Prescriptions  Medication Sig Dispense Refill  . latanoprost (XALATAN) 0.005 % ophthalmic solution Place 1 drop into both eyes at bedtime.     . metoprolol succinate (TOPROL-XL)  25 MG 24 hr tablet TAKE 1/2 TABLET (12.5 MG TOTAL) BY MOUTH EVERY 12 HOURS. 90 tablet 1  . nystatin (MYCOSTATIN) powder APPLY TO AFFECTED AREA 3 TIMES DAILY 30 g 0  . Omega-3 Fatty Acids (FISH OIL) 1000 MG CAPS Take 1,000 mg by mouth.    . pantoprazole (PROTONIX) 20 MG tablet TAKE 2 TABLETS (40 MG TOTAL) BY MOUTH DAILY. 60 tablet 5  . sertraline (ZOLOFT) 100 MG tablet Take 1.5 tablets (150 mg total) by mouth daily. 135 tablet 1  . traZODone (DESYREL) 50 MG tablet Take 1 tablet (50 mg total) by mouth at bedtime. 30 tablet 2  . valsartan-hydrochlorothiazide (DIOVAN-HCT) 160-25 MG tablet Take 1 tablet by mouth daily. 90 tablet 1   No current facility-administered medications for this visit.     Medical Decision Making:  Established Problem, Stable/Improving (1) and Review of Psycho-Social Stressors (1)  Treatment Plan Summary:Medication management  Advised patient to take Zoloft 100mg  In the morning and 50 mg at bedtime. Patient was given 90 day supply of the medication.  Patient was given a prescription of trazodone 50 mg by mouth daily at bedtime when necessary for her insomnia.  Discussed with patient that I will be leaving this practice in the end of November and she demonstrated understanding.     Follow-up in 1 months or earlier depending on her symptoms   More than 50% of the time spent in psychoeducation, counseling and coordination of care.    This note was generated in part or whole with voice recognition software. Voice regonition is  usually quite accurate but there are transcription errors that can and very often do occur. I apologize for any typographical errors that were not detected and corrected.   Rainey Pines, MD  10/15/2016, 11:18 AM

## 2016-10-16 ENCOUNTER — Ambulatory Visit: Admission: RE | Admit: 2016-10-16 | Payer: Medicare Other | Source: Ambulatory Visit

## 2016-10-16 ENCOUNTER — Ambulatory Visit: Payer: 59 | Admitting: Licensed Clinical Social Worker

## 2016-10-16 DIAGNOSIS — D485 Neoplasm of uncertain behavior of skin: Secondary | ICD-10-CM | POA: Diagnosis not present

## 2016-10-16 DIAGNOSIS — Z85828 Personal history of other malignant neoplasm of skin: Secondary | ICD-10-CM | POA: Diagnosis not present

## 2016-10-16 DIAGNOSIS — C44719 Basal cell carcinoma of skin of left lower limb, including hip: Secondary | ICD-10-CM | POA: Diagnosis not present

## 2016-10-16 DIAGNOSIS — L821 Other seborrheic keratosis: Secondary | ICD-10-CM | POA: Diagnosis not present

## 2016-10-16 DIAGNOSIS — Z1283 Encounter for screening for malignant neoplasm of skin: Secondary | ICD-10-CM | POA: Diagnosis not present

## 2016-10-16 DIAGNOSIS — L570 Actinic keratosis: Secondary | ICD-10-CM | POA: Diagnosis not present

## 2016-10-16 DIAGNOSIS — L820 Inflamed seborrheic keratosis: Secondary | ICD-10-CM | POA: Diagnosis not present

## 2016-10-24 ENCOUNTER — Ambulatory Visit: Payer: Medicare Other

## 2016-10-31 DIAGNOSIS — M1712 Unilateral primary osteoarthritis, left knee: Secondary | ICD-10-CM | POA: Diagnosis not present

## 2016-11-05 DIAGNOSIS — L570 Actinic keratosis: Secondary | ICD-10-CM | POA: Diagnosis not present

## 2016-11-05 DIAGNOSIS — C44719 Basal cell carcinoma of skin of left lower limb, including hip: Secondary | ICD-10-CM | POA: Diagnosis not present

## 2016-11-05 DIAGNOSIS — L578 Other skin changes due to chronic exposure to nonionizing radiation: Secondary | ICD-10-CM | POA: Diagnosis not present

## 2016-11-06 ENCOUNTER — Ambulatory Visit: Payer: 59 | Admitting: Licensed Clinical Social Worker

## 2016-11-07 DIAGNOSIS — M1712 Unilateral primary osteoarthritis, left knee: Secondary | ICD-10-CM | POA: Diagnosis not present

## 2016-11-14 DIAGNOSIS — M1712 Unilateral primary osteoarthritis, left knee: Secondary | ICD-10-CM | POA: Diagnosis not present

## 2016-11-21 ENCOUNTER — Other Ambulatory Visit: Payer: Self-pay | Admitting: Family Medicine

## 2016-12-12 ENCOUNTER — Other Ambulatory Visit: Payer: Self-pay | Admitting: Family Medicine

## 2017-01-04 ENCOUNTER — Encounter: Payer: Self-pay | Admitting: Family Medicine

## 2017-01-04 ENCOUNTER — Ambulatory Visit (INDEPENDENT_AMBULATORY_CARE_PROVIDER_SITE_OTHER): Payer: Medicare Other | Admitting: Family Medicine

## 2017-01-04 VITALS — BP 151/76 | HR 74 | Temp 98.8°F | Wt 140.9 lb

## 2017-01-04 DIAGNOSIS — H66001 Acute suppurative otitis media without spontaneous rupture of ear drum, right ear: Secondary | ICD-10-CM

## 2017-01-04 DIAGNOSIS — R0981 Nasal congestion: Secondary | ICD-10-CM | POA: Diagnosis not present

## 2017-01-04 LAB — VERITOR FLU A/B WAIVED
INFLUENZA B: NEGATIVE
Influenza A: NEGATIVE

## 2017-01-04 MED ORDER — BENZONATATE 200 MG PO CAPS: 200.0000 mg | ORAL_CAPSULE | Freq: Two times a day (BID) | ORAL | 0 refills | Status: DC | PRN

## 2017-01-04 MED ORDER — AZITHROMYCIN 250 MG PO TABS: ORAL_TABLET | ORAL | 0 refills | Status: DC

## 2017-01-04 MED ORDER — PREDNISONE 50 MG PO TABS: 50.0000 mg | ORAL_TABLET | Freq: Every day | ORAL | 0 refills | Status: DC

## 2017-01-04 NOTE — Progress Notes (Signed)
BP (!) 151/76 (BP Location: Left Arm, Patient Position: Sitting, Cuff Size: Small)   Pulse 74   Temp 98.8 F (37.1 C)   Wt 140 lb 14.4 oz (63.9 kg)   SpO2 99%   BMI 25.52 kg/m    Subjective:    Patient ID: Michele Meyer, female    DOB: 08-05-1946, 71 y.o.   MRN: [id number]  HPI: Michele Meyer is a 71 y.o. female  Chief Complaint  Patient presents with  . URI   UPPER RESPIRATORY TRACT INFECTION Duration: 1 week Worst symptom: body aches and ear pain Fever: yes Cough: yes Shortness of breath: yes Wheezing: no Chest pain: no Chest tightness: yes Chest congestion: no Nasal congestion: yes Runny nose: no Post nasal drip: yes Sneezing: no Sore throat: yes Swollen glands: yes Sinus pressure: yes Headache: yes Face pain: no Toothache: no Ear pain: yes bilateral Ear pressure: yes bilateral Eyes red/itching:no Eye drainage/crusting: no  Vomiting: no Rash: no Fatigue: yes Sick contacts: yes Strep contacts: no  Context: worse Recurrent sinusitis: no Relief with OTC cold/cough medications: no  Treatments attempted: none   Relevant past medical, surgical, family and social history reviewed and updated as indicated. Interim medical history since our last visit reviewed. Allergies and medications reviewed and updated.  Review of Systems  Constitutional: Positive for fatigue and fever. Negative for activity change, appetite change, chills, diaphoresis and unexpected weight change.  HENT: Positive for congestion, ear discharge, ear pain, postnasal drip, rhinorrhea, sinus pain and sinus pressure. Negative for dental problem, drooling, facial swelling, hearing loss, mouth sores, nosebleeds, sneezing, sore throat, tinnitus, trouble swallowing and voice change.   Respiratory: Positive for cough. Negative for apnea, choking, chest tightness, shortness of breath, wheezing and stridor.   Cardiovascular: Negative.   Psychiatric/Behavioral: Negative.     Per HPI unless  specifically indicated above     Objective:    BP (!) 151/76 (BP Location: Left Arm, Patient Position: Sitting, Cuff Size: Small)   Pulse 74   Temp 98.8 F (37.1 C)   Wt 140 lb 14.4 oz (63.9 kg)   SpO2 99%   BMI 25.52 kg/m   Wt Readings from Last 3 Encounters:  01/04/17 140 lb 14.4 oz (63.9 kg)  10/12/16 137 lb 14.4 oz (62.6 kg)  07/03/16 138 lb (62.6 kg)    Physical Exam  Constitutional: She is oriented to person, place, and time. She appears well-developed and well-nourished. No distress.  HENT:  Head: Normocephalic and atraumatic.  Right Ear: Hearing, external ear and ear canal normal. Tympanic membrane is erythematous and bulging. A middle ear effusion is present.  Left Ear: Hearing, tympanic membrane, external ear and ear canal normal.  Nose: Nose normal.  Mouth/Throat: Uvula is midline, oropharynx is clear and moist and mucous membranes are normal. No oropharyngeal exudate.  Eyes: Conjunctivae, EOM and lids are normal. Pupils are equal, round, and reactive to light. Right eye exhibits no discharge. Left eye exhibits no discharge. No scleral icterus.  Neck: Normal range of motion. Neck supple. No JVD present. No tracheal deviation present. No thyromegaly present.  Cardiovascular: Normal rate, regular rhythm, normal heart sounds and intact distal pulses.  Exam reveals no gallop and no friction rub.   No murmur heard. Pulmonary/Chest: Effort normal and breath sounds normal. No stridor. No respiratory distress. She has no wheezes. She has no rales. She exhibits no tenderness.  Musculoskeletal: Normal range of motion.  Lymphadenopathy:    She has no cervical adenopathy.  Neurological:  She is alert and oriented to person, place, and time.  Skin: Skin is warm, dry and intact. No rash noted. She is not diaphoretic. No erythema. No pallor.  Psychiatric: She has a normal mood and affect. Her speech is normal and behavior is normal. Judgment and thought content normal. Cognition and  memory are normal.  Nursing note and vitals reviewed.   Results for orders placed or performed in visit on 10/12/16  Microscopic Examination  Result Value Ref Range   WBC, UA 6-10 (A) 0 - 5 /hpf   RBC, UA 0-2 0 - 2 /hpf   Epithelial Cells (non renal) 0-10 0 - 10 /hpf   Bacteria, UA Few (A) None seen/Few  CBC with Differential/Platelet  Result Value Ref Range   WBC 7.0 3.4 - 10.8 x10E3/uL   RBC 4.24 3.77 - 5.28 x10E6/uL   Hemoglobin 12.4 11.1 - 15.9 g/dL   Hematocrit 35.6 34.0 - 46.6 %   MCV 84 79 - 97 fL   MCH 29.2 26.6 - 33.0 pg   MCHC 34.8 31.5 - 35.7 g/dL   RDW 13.1 12.3 - 15.4 %   Platelets 249 150 - 379 x10E3/uL   Neutrophils 65 Not Estab. %   Lymphs 19 Not Estab. %   Monocytes 9 Not Estab. %   Eos 6 Not Estab. %   Basos 1 Not Estab. %   Neutrophils Absolute 4.6 1.4 - 7.0 x10E3/uL   Lymphocytes Absolute 1.4 0.7 - 3.1 x10E3/uL   Monocytes Absolute 0.6 0.1 - 0.9 x10E3/uL   EOS (ABSOLUTE) 0.5 (H) 0.0 - 0.4 x10E3/uL   Basophils Absolute 0.0 0.0 - 0.2 x10E3/uL   Immature Granulocytes 0 Not Estab. %   Immature Grans (Abs) 0.0 0.0 - 0.1 x10E3/uL  Comprehensive metabolic panel  Result Value Ref Range   Glucose 88 65 - 99 mg/dL   BUN 16 8 - 27 mg/dL   Creatinine, Ser 0.73 0.57 - 1.00 mg/dL   GFR calc non Af Amer 83 >59 mL/min/1.73   GFR calc Af Amer 96 >59 mL/min/1.73   BUN/Creatinine Ratio 22 12 - 28   Sodium 135 134 - 144 mmol/L   Potassium 4.7 3.5 - 5.2 mmol/L   Chloride 91 (L) 96 - 106 mmol/L   CO2 28 18 - 29 mmol/L   Calcium 9.9 8.7 - 10.3 mg/dL   Total Protein 7.3 6.0 - 8.5 g/dL   Albumin 4.5 3.5 - 4.8 g/dL   Globulin, Total 2.8 1.5 - 4.5 g/dL   Albumin/Globulin Ratio 1.6 1.2 - 2.2   Bilirubin Total 0.5 0.0 - 1.2 mg/dL   Alkaline Phosphatase 94 39 - 117 IU/L   AST 19 0 - 40 IU/L   ALT 15 0 - 32 IU/L  Microalbumin, Urine Waived  Result Value Ref Range   Microalb, Ur Waived 10 0 - 19 mg/L   Creatinine, Urine Waived 50 10 - 300 mg/dL   Microalb/Creat Ratio  30-300 (H) <30 mg/g  TSH  Result Value Ref Range   TSH 1.370 0.450 - 4.500 uIU/mL  UA/M w/rflx Culture, Routine  Result Value Ref Range   Specific Gravity, UA 1.010 1.005 - 1.030   pH, UA 6.0 5.0 - 7.5   Color, UA Yellow Yellow   Appearance Ur Clear Clear   Leukocytes, UA 2+ (A) Negative   Protein, UA Negative Negative/Trace   Glucose, UA Negative Negative   Ketones, UA Negative Negative   RBC, UA Trace (A) Negative   Bilirubin, UA Negative  Negative   Urobilinogen, Ur 1.0 0.2 - 1.0 mg/dL   Nitrite, UA Negative Negative   Microscopic Examination See below:   Lipid Panel Piccolo, Waived  Result Value Ref Range   Cholesterol Piccolo, Waived 236 (H) <200 mg/dL   HDL Chol Piccolo, Waived 98 >59 mg/dL   Triglycerides Piccolo,Waived 83 <150 mg/dL   Chol/HDL Ratio Piccolo,Waive 2.4 mg/dL   LDL Chol Calc Piccolo Waived 122 (H) <100 mg/dL   VLDL Chol Calc Piccolo,Waive 17 <30 mg/dL      Assessment & Plan:   Problem List Items Addressed This Visit    None    Visit Diagnoses    Acute suppurative otitis media of right ear without spontaneous rupture of tympanic membrane, recurrence not specified    -  Primary   Will treat with z-pack given PCN allergy. Prednisone burst and tessalon for comfort. Call with any concerns or if not getting better.    Relevant Medications   azithromycin (ZITHROMAX) 250 MG tablet   Nasal congestion       Flu negative.    Relevant Orders   Veritor Flu A/B Waived       Follow up plan: Return if symptoms worsen or fail to improve.

## 2017-01-07 ENCOUNTER — Telehealth: Payer: Self-pay | Admitting: Family Medicine

## 2017-01-07 MED ORDER — DOXYCYCLINE HYCLATE 100 MG PO TABS: 100.0000 mg | ORAL_TABLET | Freq: Two times a day (BID) | ORAL | 0 refills | Status: DC

## 2017-01-07 NOTE — Telephone Encounter (Signed)
Patient notified

## 2017-01-07 NOTE — Telephone Encounter (Signed)
I've called her in a new antibiotic. She can switch, if she's not getting better in 3-4 days, she should be seen.

## 2017-01-07 NOTE — Telephone Encounter (Signed)
Patient states that her cough has changed (has gone from tight to a rattling sound) and that her ear pain is A LOT worse. Patient states she just keeps getting worse instead of better.  Please call patient to advise.

## 2017-01-14 ENCOUNTER — Ambulatory Visit
Admission: RE | Admit: 2017-01-14 | Discharge: 2017-01-14 | Disposition: A | Discharge status: Home / Self Care | Payer: Medicare Other | Source: Ambulatory Visit | Attending: Family Medicine | Admitting: Family Medicine

## 2017-01-14 ENCOUNTER — Encounter: Payer: Self-pay | Admitting: Family Medicine

## 2017-01-14 ENCOUNTER — Other Ambulatory Visit: Payer: Self-pay

## 2017-01-14 ENCOUNTER — Ambulatory Visit (INDEPENDENT_AMBULATORY_CARE_PROVIDER_SITE_OTHER): Payer: Medicare Other | Admitting: Family Medicine

## 2017-01-14 VITALS — BP 163/82 | HR 76 | Temp 98.1°F | Wt 139.0 lb

## 2017-01-14 DIAGNOSIS — R05 Cough: Secondary | ICD-10-CM | POA: Diagnosis not present

## 2017-01-14 DIAGNOSIS — J069 Acute upper respiratory infection, unspecified: Secondary | ICD-10-CM | POA: Diagnosis not present

## 2017-01-14 DIAGNOSIS — R059 Cough, unspecified: Secondary | ICD-10-CM

## 2017-01-14 DIAGNOSIS — R0602 Shortness of breath: Secondary | ICD-10-CM | POA: Diagnosis not present

## 2017-01-14 MED ORDER — LEVOFLOXACIN 500 MG PO TABS: 500.0000 mg | ORAL_TABLET | Freq: Every day | ORAL | 0 refills | Status: DC

## 2017-01-14 MED ORDER — HYDROCOD POLST-CPM POLST ER 10-8 MG/5ML PO SUER: 5.0000 mL | Freq: Two times a day (BID) | ORAL | 0 refills | Status: DC | PRN

## 2017-01-14 MED ORDER — ALBUTEROL SULFATE HFA 108 (90 BASE) MCG/ACT IN AERS: 2.0000 | INHALATION_SPRAY | Freq: Four times a day (QID) | RESPIRATORY_TRACT | 0 refills | Status: DC | PRN

## 2017-01-14 MED ORDER — PREDNISONE 20 MG PO TABS: 40.0000 mg | ORAL_TABLET | Freq: Every day | ORAL | 0 refills | Status: DC

## 2017-01-14 NOTE — Progress Notes (Signed)
BP (!) 163/82   Pulse 76   Temp 98.1 F (36.7 C)   Wt 139 lb (63 kg)   SpO2 97%   BMI 25.18 kg/m    Subjective:    Patient ID: Michele Meyer, female    DOB: 1946-07-23, 71 y.o.   MRN: [id number]  HPI: Michele Meyer is a 71 y.o. female  Chief Complaint  Patient presents with  . URI    x 3 weeks, chest/head congestion, cough worsening over the last week, sinus drainage, some sore throat, runny nose. Thick clear mucus. Some fever last week.    Patient presents with 3 week history of congestion, productive cough, drainage. Was seen over a week ago and given azithromycin for an ear infection. Several days into course, called back as she was no better and was given doxycycline. Misunderstood instructions and only took 3/10 days. No improvement, feels maybe like cough and SOB is worse now. Denies fever, chills, aches. Hx of pneumonia, very concerned that this may be what's going on with her at this point. Tessalon perles not working at all at this point.    Relevant past medical, surgical, family and social history reviewed and updated as indicated. Interim medical history since our last visit reviewed. Allergies and medications reviewed and updated.  Review of Systems  Constitutional: Negative.   HENT: Positive for congestion.   Respiratory: Positive for cough, chest tightness and shortness of breath.   Cardiovascular: Negative.   Gastrointestinal: Negative.   Genitourinary: Negative.   Musculoskeletal: Negative.   Neurological: Negative.   Psychiatric/Behavioral: Negative.     Per HPI unless specifically indicated above     Objective:    BP (!) 163/82   Pulse 76   Temp 98.1 F (36.7 C)   Wt 139 lb (63 kg)   SpO2 97%   BMI 25.18 kg/m   Wt Readings from Last 3 Encounters:  01/14/17 139 lb (63 kg)  01/04/17 140 lb 14.4 oz (63.9 kg)  10/12/16 137 lb 14.4 oz (62.6 kg)    Physical Exam  Constitutional: She is oriented to person, place, and time. She appears  well-developed and well-nourished.  HENT:  Head: Atraumatic.  Right Ear: External ear normal.  Left Ear: External ear normal.  Mouth/Throat: No oropharyngeal exudate.  Oropharynx erythematous  Eyes: Conjunctivae are normal. Pupils are equal, round, and reactive to light.  Neck: Normal range of motion. Neck supple.  Cardiovascular: Normal rate and normal heart sounds.   Pulmonary/Chest: Effort normal. She has wheezes (moderate wheezes b/l).  Auscultation difficult as pt could not stop coughing during deep breaths  Musculoskeletal: Normal range of motion.  Neurological: She is alert and oriented to person, place, and time.  Skin: Skin is warm and dry.  Psychiatric: She has a normal mood and affect. Her behavior is normal.  Nursing note and vitals reviewed.   Results for orders placed or performed in visit on 01/04/17  Veritor Flu A/B Waived  Result Value Ref Range   Influenza A Negative Negative   Influenza B Negative Negative      Assessment & Plan:   Problem List Items Addressed This Visit    None    Visit Diagnoses    Upper respiratory tract infection, unspecified type    -  Primary   Will obtain CXR, start levaquin, tussionex, albuterol inhaler, and prednisone in the meantime. Follow up if no improvement in next few days.    Relevant Orders   DG Chest 2  View (Completed)       Follow up plan: Return if symptoms worsen or fail to improve.

## 2017-01-15 ENCOUNTER — Telehealth: Payer: Self-pay | Admitting: Family Medicine

## 2017-01-15 NOTE — Telephone Encounter (Signed)
Patient notified, she states the prednisone is already helping her feel better today.

## 2017-01-15 NOTE — Telephone Encounter (Signed)
Please call pt and let her know that her chest x-ray came back normal, no pneumonia. Continue taking the medications prescribed and let us know if she's not improving

## 2017-01-15 NOTE — Telephone Encounter (Signed)
Patient called to see if Dr Wynetta Emery had the results of her chest xrays from yesterday.  Thank Dennis Bast Santiago Glad  [phone]

## 2017-01-15 NOTE — Patient Instructions (Signed)
Follow up as needed

## 2017-01-15 NOTE — Telephone Encounter (Signed)
Patient notified, see other phone call message.

## 2017-02-06 ENCOUNTER — Ambulatory Visit
Admission: RE | Admit: 2017-02-06 | Discharge: 2017-02-06 | Disposition: A | Discharge status: Home / Self Care | Payer: Medicare Other | Source: Ambulatory Visit | Attending: Family Medicine | Admitting: Family Medicine

## 2017-02-06 DIAGNOSIS — M810 Age-related osteoporosis without current pathological fracture: Secondary | ICD-10-CM | POA: Insufficient documentation

## 2017-02-06 DIAGNOSIS — Z1239 Encounter for other screening for malignant neoplasm of breast: Secondary | ICD-10-CM

## 2017-02-06 DIAGNOSIS — Z1231 Encounter for screening mammogram for malignant neoplasm of breast: Secondary | ICD-10-CM | POA: Insufficient documentation

## 2017-02-06 DIAGNOSIS — Z13820 Encounter for screening for osteoporosis: Secondary | ICD-10-CM | POA: Diagnosis not present

## 2017-02-07 ENCOUNTER — Encounter: Payer: Self-pay | Admitting: Family Medicine

## 2017-02-07 ENCOUNTER — Telehealth: Payer: Self-pay | Admitting: Family Medicine

## 2017-02-07 DIAGNOSIS — M810 Age-related osteoporosis without current pathological fracture: Secondary | ICD-10-CM | POA: Insufficient documentation

## 2017-02-07 NOTE — Telephone Encounter (Signed)
Called and was unable to leave a message. Mammogram looked great but her bone density shows some osteoporosis. Need to confirm that she was never on a bisphospinate in the past, and if she hasn't been we will start her on fosamax. OK to ask her this if she calls back, otherwise I will try her again tomorrow.

## 2017-02-08 ENCOUNTER — Telehealth: Payer: Self-pay | Admitting: Family Medicine

## 2017-02-08 MED ORDER — ALENDRONATE SODIUM 70 MG PO TABS: 70.0000 mg | ORAL_TABLET | ORAL | 11 refills | Status: DC

## 2017-02-08 NOTE — Telephone Encounter (Signed)
Spoke with pt and read results as stated in the previous message. Pt would like a call back regarding medication.

## 2017-02-08 NOTE — Telephone Encounter (Signed)
Called and spoke with patient. She stated that her oldest daughter is taking fosamax. Patient is wanting to know if medication, fosamax, will mess with her stomach. She also wants to know if she should take anything else like vitamin D. Patient would like a call back.

## 2017-02-08 NOTE — Telephone Encounter (Signed)
Called Jamin about her results. Went over options and risks and benefits. She is willing to try the fosamax. Rx sent to her pharmacy.  She notes that she is still not feeling well from her sickness. Will rest over the weekend and if not better by Monday, will call to be seen.

## 2017-02-08 NOTE — Telephone Encounter (Signed)
Patient really wants to speak with Dr Wynetta Emery or a CMA to discuss her issues with osteoporosis before the weekend as she wants to start her medication for this ASAP.  Thank you Santiago Glad

## 2017-02-08 NOTE — Telephone Encounter (Signed)
Please see other phone note

## 2017-02-15 ENCOUNTER — Telehealth: Payer: Self-pay | Admitting: Family Medicine

## 2017-02-15 DIAGNOSIS — M810 Age-related osteoporosis without current pathological fracture: Secondary | ICD-10-CM

## 2017-02-15 NOTE — Telephone Encounter (Signed)
Dr. Wynetta Emery, is this one of the patient's you were planning to call?

## 2017-02-18 NOTE — Telephone Encounter (Signed)
She notes that her stomach really bothers her with the foxamax. She notes that her eyes have also been getting blurry on the medicine. She last took the fosamax on Tuesday. Will stop it due to side effects and will refer to rheumatology for evaluation to get her started on Reclast.

## 2017-03-06 DIAGNOSIS — M1712 Unilateral primary osteoarthritis, left knee: Secondary | ICD-10-CM | POA: Diagnosis not present

## 2017-03-11 ENCOUNTER — Ambulatory Visit (INDEPENDENT_AMBULATORY_CARE_PROVIDER_SITE_OTHER): Payer: Medicare Other | Admitting: Family Medicine

## 2017-03-11 ENCOUNTER — Ambulatory Visit
Admission: RE | Admit: 2017-03-11 | Discharge: 2017-03-11 | Disposition: A | Discharge status: Home / Self Care | Payer: Medicare Other | Source: Ambulatory Visit | Attending: Family Medicine | Admitting: Family Medicine

## 2017-03-11 ENCOUNTER — Encounter: Payer: Self-pay | Admitting: Family Medicine

## 2017-03-11 VITALS — BP 123/90 | HR 71 | Temp 98.7°F | Resp 17 | Ht 62.3 in | Wt 137.0 lb

## 2017-03-11 DIAGNOSIS — M79642 Pain in left hand: Secondary | ICD-10-CM

## 2017-03-11 DIAGNOSIS — M25832 Other specified joint disorders, left wrist: Secondary | ICD-10-CM | POA: Insufficient documentation

## 2017-03-11 DIAGNOSIS — M19042 Primary osteoarthritis, left hand: Secondary | ICD-10-CM | POA: Insufficient documentation

## 2017-03-11 NOTE — Progress Notes (Signed)
BP 123/90 (BP Location: Left Arm, Patient Position: Sitting, Cuff Size: Normal)   Pulse 71   Temp 98.7 F (37.1 C) (Oral)   Resp 17   Ht 5' 2.3" (1.582 m)   Wt 137 lb (62.1 kg)   SpO2 98%   BMI 24.82 kg/m    Subjective:    Patient ID: [name], female    DOB: 02-May-1946, 71 y.o.   MRN: [id number]  HPI: PAYAL STANFORTH is a 71 y.o. female  Chief Complaint  Patient presents with  . Hand Pain    Onset yesterday/ Left   HAND PAIN Duration: yesterday Involved hand: left Mechanism of injury: unknown Location: dorsum and 1st MCP Onset: sudden Severity: severe  Quality: aching and burning Frequency: constant Radiation: no Aggravating factors: nothing Alleviating factors: nothing Treatments attempted: ibuprofen, ice Relief with NSAIDs?: mild Weakness: yes Numbness: yes- coming and going in the finger tips and into her thumb Redness: yes Swelling:yes Bruising: no Fevers: no  Relevant past medical, surgical, family and social history reviewed and updated as indicated. Interim medical history since our last visit reviewed. Allergies and medications reviewed and updated.  Review of Systems  Constitutional: Negative.   Respiratory: Negative.   Cardiovascular: Negative.   Musculoskeletal: Positive for arthralgias and myalgias. Negative for back pain, gait problem, joint swelling, neck pain and neck stiffness.  Skin: Negative.   Psychiatric/Behavioral: Negative.     Per HPI unless specifically indicated above     Objective:    BP 123/90 (BP Location: Left Arm, Patient Position: Sitting, Cuff Size: Normal)   Pulse 71   Temp 98.7 F (37.1 C) (Oral)   Resp 17   Ht 5' 2.3" (1.582 m)   Wt 137 lb (62.1 kg)   SpO2 98%   BMI 24.82 kg/m   Wt Readings from Last 3 Encounters:  03/11/17 137 lb (62.1 kg)  01/14/17 139 lb (63 kg)  01/04/17 140 lb 14.4 oz (63.9 kg)    Physical Exam  Constitutional: She is oriented to person, place, and time. She appears  well-developed and well-nourished. No distress.  HENT:  Head: Normocephalic and atraumatic.  Right Ear: Hearing normal.  Left Ear: Hearing normal.  Nose: Nose normal.  Eyes: Conjunctivae and lids are normal. Right eye exhibits no discharge. Left eye exhibits no discharge. No scleral icterus.  Pulmonary/Chest: Effort normal. No respiratory distress.  Musculoskeletal: Normal range of motion. She exhibits edema and tenderness. She exhibits no deformity.  Tenderness along L 1st MCP joint, mild swelling, no erythema, no heat, pronounced joints in hands bilaterally, tender on L, not right.   Neurological: She is alert and oriented to person, place, and time.  Skin: Skin is warm, dry and intact. No rash noted. No erythema. No pallor.  Psychiatric: She has a normal mood and affect. Her speech is normal and behavior is normal. Judgment and thought content normal. Cognition and memory are normal.  Nursing note and vitals reviewed.   Results for orders placed or performed in visit on 01/04/17  Veritor Flu A/B Waived  Result Value Ref Range   Influenza A Negative Negative   Influenza B Negative Negative      Assessment & Plan:   Problem List Items Addressed This Visit    None    Visit Diagnoses    Left hand pain    -  Primary   Will check labs to look for causese, obtain x-ray. Ibuprofen '600mg'$  every 6 hours as needed. Await results. Call  with any concerns.    Relevant Orders   DG Hand Complete Left   Uric acid   RA Qn+CCP(IgG/A)+SjoSSA+SjoSSB   CBC with Differential/Platelet   Comprehensive metabolic panel   Sed Rate (ESR)   VITAMIN D 25 Hydroxy (Vit-D Deficiency, Fractures)   Antinuclear Antib (ANA)       Follow up plan: Return Pending results.

## 2017-03-12 ENCOUNTER — Telehealth: Payer: Self-pay | Admitting: Family Medicine

## 2017-03-12 MED ORDER — VITAMIN D (ERGOCALCIFEROL) 1.25 MG (50000 UNIT) PO CAPS: 50000.0000 [IU] | ORAL_CAPSULE | ORAL | 0 refills | Status: DC

## 2017-03-12 NOTE — Telephone Encounter (Signed)
Called and discussed her results. Seeing Dr. Meda Coffee again in about 2 weeks. Will try capcaisin cream on her hand. Call with any concerns.

## 2017-03-13 LAB — RA QN+CCP(IGG/A)+SJOSSA+SJOSSB
Cyclic Citrullin Peptide Ab: 3 units (ref 0–19)
ENA SSB (LA) Ab: <0.2 AI (ref 0.0–0.9)
Rhuematoid fact SerPl-aCnc: 90.9 IU/mL — ABNORMAL HIGH (ref 0.0–13.9)

## 2017-03-13 LAB — CBC WITH DIFFERENTIAL/PLATELET
Basophils Absolute: 0 10*3/uL (ref 0.0–0.2)
Basos: 0 %
EOS (ABSOLUTE): 0.4 10*3/uL (ref 0.0–0.4)
EOS: 5 %
HEMATOCRIT: 37 % (ref 34.0–46.6)
Hemoglobin: 12.4 g/dL (ref 11.1–15.9)
IMMATURE GRANS (ABS): 0 10*3/uL (ref 0.0–0.1)
IMMATURE GRANULOCYTES: 0 %
LYMPHS: 20 %
Lymphocytes Absolute: 1.6 10*3/uL (ref 0.7–3.1)
MCH: 28.2 pg (ref 26.6–33.0)
MCHC: 33.5 g/dL (ref 31.5–35.7)
MCV: 84 fL (ref 79–97)
MONOS ABS: 0.8 10*3/uL (ref 0.1–0.9)
Monocytes: 10 %
NEUTROS PCT: 65 %
Neutrophils Absolute: 5.4 10*3/uL (ref 1.4–7.0)
Platelets: 271 10*3/uL (ref 150–379)
RBC: 4.39 x10E6/uL (ref 3.77–5.28)
RDW: 13.7 % (ref 12.3–15.4)
WBC: 8.3 10*3/uL (ref 3.4–10.8)

## 2017-03-13 LAB — ANA: Anti Nuclear Antibody(ANA): NEGATIVE

## 2017-03-13 LAB — COMPREHENSIVE METABOLIC PANEL
A/G RATIO: 1.5 (ref 1.2–2.2)
ALK PHOS: 89 IU/L (ref 39–117)
ALT: 18 IU/L (ref 0–32)
AST: 15 IU/L (ref 0–40)
Albumin: 4.1 g/dL (ref 3.5–4.8)
BUN/Creatinine Ratio: 20 (ref 12–28)
BUN: 16 mg/dL (ref 8–27)
Bilirubin Total: 0.4 mg/dL (ref 0.0–1.2)
CO2: 24 mmol/L (ref 18–29)
Calcium: 9.7 mg/dL (ref 8.7–10.3)
Chloride: 93 mmol/L — ABNORMAL LOW (ref 96–106)
Creatinine, Ser: 0.82 mg/dL (ref 0.57–1.00)
GFR calc Af Amer: 83 mL/min/{1.73_m2} (ref >59)
GFR calc non Af Amer: 72 mL/min/{1.73_m2} (ref >59)
GLOBULIN, TOTAL: 2.7 g/dL (ref 1.5–4.5)
Glucose: 92 mg/dL (ref 65–99)
POTASSIUM: 4.5 mmol/L (ref 3.5–5.2)
SODIUM: 133 mmol/L — AB (ref 134–144)
Total Protein: 6.8 g/dL (ref 6.0–8.5)

## 2017-03-13 LAB — SEDIMENTATION RATE: SED RATE: 3 mm/h (ref 0–40)

## 2017-03-13 LAB — VITAMIN D 25 HYDROXY (VIT D DEFICIENCY, FRACTURES): VIT D 25 HYDROXY: 18.6 ng/mL — AB (ref 30.0–100.0)

## 2017-03-13 LAB — URIC ACID: Uric Acid: 3.9 mg/dL (ref 2.5–7.1)

## 2017-03-28 DIAGNOSIS — M810 Age-related osteoporosis without current pathological fracture: Secondary | ICD-10-CM | POA: Diagnosis not present

## 2017-03-28 DIAGNOSIS — M19042 Primary osteoarthritis, left hand: Secondary | ICD-10-CM | POA: Diagnosis not present

## 2017-03-28 DIAGNOSIS — R768 Other specified abnormal immunological findings in serum: Secondary | ICD-10-CM | POA: Diagnosis not present

## 2017-03-28 DIAGNOSIS — M5136 Other intervertebral disc degeneration, lumbar region: Secondary | ICD-10-CM | POA: Diagnosis not present

## 2017-03-28 DIAGNOSIS — M19041 Primary osteoarthritis, right hand: Secondary | ICD-10-CM | POA: Diagnosis not present

## 2017-04-08 ENCOUNTER — Ambulatory Visit: Payer: Medicare Other | Admitting: Occupational Therapy

## 2017-04-16 ENCOUNTER — Ambulatory Visit: Payer: Medicare Other | Admitting: Occupational Therapy

## 2017-05-08 DIAGNOSIS — M810 Age-related osteoporosis without current pathological fracture: Secondary | ICD-10-CM | POA: Diagnosis not present

## 2017-05-08 DIAGNOSIS — R768 Other specified abnormal immunological findings in serum: Secondary | ICD-10-CM | POA: Diagnosis not present

## 2017-05-08 DIAGNOSIS — M1990 Unspecified osteoarthritis, unspecified site: Secondary | ICD-10-CM | POA: Diagnosis not present

## 2017-05-17 DIAGNOSIS — M069 Rheumatoid arthritis, unspecified: Secondary | ICD-10-CM | POA: Diagnosis not present

## 2017-05-17 DIAGNOSIS — Z79899 Other long term (current) drug therapy: Secondary | ICD-10-CM | POA: Diagnosis not present

## 2017-05-28 DIAGNOSIS — M19042 Primary osteoarthritis, left hand: Secondary | ICD-10-CM | POA: Diagnosis not present

## 2017-05-28 DIAGNOSIS — M546 Pain in thoracic spine: Secondary | ICD-10-CM | POA: Diagnosis not present

## 2017-05-28 DIAGNOSIS — G5602 Carpal tunnel syndrome, left upper limb: Secondary | ICD-10-CM | POA: Diagnosis not present

## 2017-05-28 DIAGNOSIS — M810 Age-related osteoporosis without current pathological fracture: Secondary | ICD-10-CM | POA: Diagnosis not present

## 2017-05-28 DIAGNOSIS — G5601 Carpal tunnel syndrome, right upper limb: Secondary | ICD-10-CM | POA: Diagnosis not present

## 2017-05-28 DIAGNOSIS — M19041 Primary osteoarthritis, right hand: Secondary | ICD-10-CM | POA: Diagnosis not present

## 2017-06-11 DIAGNOSIS — M810 Age-related osteoporosis without current pathological fracture: Secondary | ICD-10-CM | POA: Diagnosis not present

## 2017-06-14 ENCOUNTER — Other Ambulatory Visit: Payer: Self-pay | Admitting: Family Medicine

## 2017-06-14 MED ORDER — TRAZODONE HCL 50 MG PO TABS: 50.0000 mg | ORAL_TABLET | Freq: Every day | ORAL | 2 refills | Status: DC

## 2017-06-14 NOTE — Telephone Encounter (Signed)
Patient called to get a refill on prescriptions for:   Valsartan has enough until Monday.  Completely out of:  metoprolol, trazodone  Patient would like to know if provider is able to refill the medications.  Please Advise.  Thank you

## 2017-06-17 ENCOUNTER — Ambulatory Visit (INDEPENDENT_AMBULATORY_CARE_PROVIDER_SITE_OTHER): Payer: Medicare Other | Admitting: Family Medicine

## 2017-06-17 ENCOUNTER — Encounter: Payer: Self-pay | Admitting: Family Medicine

## 2017-06-17 VITALS — BP 159/77 | HR 72 | Temp 98.7°F | Wt 138.4 lb

## 2017-06-17 DIAGNOSIS — T50905A Adverse effect of unspecified drugs, medicaments and biological substances, initial encounter: Secondary | ICD-10-CM

## 2017-06-17 DIAGNOSIS — T887XXA Unspecified adverse effect of drug or medicament, initial encounter: Secondary | ICD-10-CM | POA: Diagnosis not present

## 2017-06-17 MED ORDER — TRIAMCINOLONE ACETONIDE 40 MG/ML IJ SUSP
80.0000 mg | Freq: Once | INTRAMUSCULAR | Status: AC
  Administered 2017-06-17: 80 mg via INTRAMUSCULAR

## 2017-06-17 NOTE — Patient Instructions (Addendum)
Zyrtec/Claritin/Allegra- 1 daily Benadryl 25mg  at bedtime 150mg  zantac 2x a day We'll check in with you on Thursday   Drug Rash A drug rash is a change in the color or texture of the skin that is caused by a drug. It can develop minutes, hours, or days after the person takes the drug. What are the causes? This condition is usually caused by a drug allergy. It can also be caused by exposure to sunlight after taking a drug that makes the skin sensitive to light. Drugs that commonly cause rashes include:  Penicillin.  Antibiotic medicines.  Medicines that treat seizures.  Medicines that treat cancer (chemotherapy).  Aspirin and other nonsteroidal anti-inflammatory drugs (NSAIDs).  Injectable dyes that contain iodine.  Insulin.  What are the signs or symptoms? Symptoms of this condition include:  Redness.  Tiny bumps.  Peeling.  Itching.  Itchy welts (hives).  Swelling.  The rash may appear on a small area of skin or all over the body. How is this diagnosed? To diagnose the condition, your health care provider will do a physical exam. He or she may also order tests to find out which drug caused the rash. Tests to find the cause of a rash include:  Skin tests.  Blood tests.  Drug challenge. For this test, you stop taking all of the drugs that you do not need to take, and then you start taking them again by adding back one of the drugs at a time.  How is this treated? A drug rash may be treated with medicines, including:  Antihistamines. These may be given to relieve itching.  An NSAID. This may be given to reduce swelling and treat pain.  A steroid drug. This may be given to reduce swelling.  The rash usually goes away when the person stops taking the drug that caused it. Follow these instructions at home:  Take medicines only as directed by your health care provider.  Let all of your health care providers know about any drug reactions you have had in the  past.  If you have hives, take a cool shower or use a cool compress to relieve itchiness. Contact a health care provider if:  You have a fever.  Your rash is not going away.  Your rash gets worse.  Your rash comes back.  You have wheezing or coughing. Get help right away if:  You start to have breathing problems.  You start to have shortness of breath.  You face or throat starts to swell.  You have severe weakness with dizziness or fainting.  You have chest pain. This information is not intended to replace advice given to you by your health care provider. Make sure you discuss any questions you have with your health care provider. Document Released: 01/17/2005 Document Revised: 05/17/2016 Document Reviewed: 10/06/2014 Elsevier Interactive Patient Education  [employer].

## 2017-06-17 NOTE — Progress Notes (Signed)
BP (!) 159/77 (BP Location: Left Arm, Patient Position: Sitting, Cuff Size: Normal)   Pulse 72   Temp 98.7 F (37.1 C)   Wt 138 lb 7 oz (62.8 kg)   SpO2 99%   BMI 25.08 kg/m    Subjective:    Patient ID: Michele Meyer, female    DOB: 16-Jan-1946, 71 y.o.   MRN: [id number]  HPI: Michele Meyer is a 71 y.o. female  Chief Complaint  Patient presents with  . Rash    Fever at nights, pain, burning and redness. Patient had an infusion last Tuesday for Osteoporosis.    RASH- had an infusion of reclast Tuesday- this started on Thursday Duration:  4 days  Location: generalized  Itching: yes Burning: yes Redness: yes Oozing: no Scaling: yes Blisters: no Painful: yes Fevers: yes Change in detergents/soaps/personal care products: no Recent illness: no Recent travel:no History of same: no Context: stable Alleviating factors: calomine Treatments attempted:calomine Shortness of breath: 2 days ago- thinks that it was a panic attack  Throat/tongue swelling: no Myalgias/arthralgias: no  Relevant past medical, surgical, family and social history reviewed and updated as indicated. Interim medical history since our last visit reviewed. Allergies and medications reviewed and updated.  Review of Systems  Constitutional: Negative.   Respiratory: Negative.   Cardiovascular: Negative.   Musculoskeletal: Negative.   Psychiatric/Behavioral: Negative.     Per HPI unless specifically indicated above     Objective:    BP (!) 159/77 (BP Location: Left Arm, Patient Position: Sitting, Cuff Size: Normal)   Pulse 72   Temp 98.7 F (37.1 C)   Wt 138 lb 7 oz (62.8 kg)   SpO2 99%   BMI 25.08 kg/m   Wt Readings from Last 3 Encounters:  06/17/17 138 lb 7 oz (62.8 kg)  03/11/17 137 lb (62.1 kg)  01/14/17 139 lb (63 kg)    Physical Exam  Constitutional: She is oriented to person, place, and time. She appears well-developed and well-nourished. No distress.  HENT:  Head:  Normocephalic and atraumatic.  Right Ear: Hearing normal.  Left Ear: Hearing normal.  Nose: Nose normal.  Eyes: Conjunctivae and lids are normal. Right eye exhibits no discharge. Left eye exhibits no discharge. No scleral icterus.  Cardiovascular: Normal rate, regular rhythm, normal heart sounds and intact distal pulses.  Exam reveals no gallop and no friction rub.   No murmur heard. Pulmonary/Chest: Effort normal and breath sounds normal. No respiratory distress. She has no wheezes. She has no rales. She exhibits no tenderness.  Musculoskeletal: Normal range of motion.  Neurological: She is alert and oriented to person, place, and time.  Skin: Skin is warm, dry and intact. Rash noted. No erythema. No pallor.  maculo-papular rash over entire body  Psychiatric: She has a normal mood and affect. Her speech is normal and behavior is normal. Judgment and thought content normal. Cognition and memory are normal.  Nursing note and vitals reviewed.   Results for orders placed or performed in visit on 03/11/17  Uric acid  Result Value Ref Range   Uric Acid 3.9 2.5 - 7.1 mg/dL  RA Qn+CCP(IgG/A)+SjoSSA+SjoSSB  Result Value Ref Range   Rhuematoid fact SerPl-aCnc 90.9 (H) 0.0 - 13.9 IU/mL   ENA SSA (RO) Ab <0.2 0.0 - 0.9 AI   ENA SSB (LA) Ab <0.2 0.0 - 0.9 AI   Cyclic Citrullin Peptide Ab 3 0 - 19 units  CBC with Differential/Platelet  Result Value Ref Range   WBC  8.3 3.4 - 10.8 x10E3/uL   RBC 4.39 3.77 - 5.28 x10E6/uL   Hemoglobin 12.4 11.1 - 15.9 g/dL   Hematocrit 37.0 34.0 - 46.6 %   MCV 84 79 - 97 fL   MCH 28.2 26.6 - 33.0 pg   MCHC 33.5 31.5 - 35.7 g/dL   RDW 13.7 12.3 - 15.4 %   Platelets 271 150 - 379 x10E3/uL   Neutrophils 65 Not Estab. %   Lymphs 20 Not Estab. %   Monocytes 10 Not Estab. %   Eos 5 Not Estab. %   Basos 0 Not Estab. %   Neutrophils Absolute 5.4 1.4 - 7.0 x10E3/uL   Lymphocytes Absolute 1.6 0.7 - 3.1 x10E3/uL   Monocytes Absolute 0.8 0.1 - 0.9 x10E3/uL   EOS  (ABSOLUTE) 0.4 0.0 - 0.4 x10E3/uL   Basophils Absolute 0.0 0.0 - 0.2 x10E3/uL   Immature Granulocytes 0 Not Estab. %   Immature Grans (Abs) 0.0 0.0 - 0.1 x10E3/uL  Comprehensive metabolic panel  Result Value Ref Range   Glucose 92 65 - 99 mg/dL   BUN 16 8 - 27 mg/dL   Creatinine, Ser 0.82 0.57 - 1.00 mg/dL   GFR calc non Af Amer 72 >59 mL/min/1.73   GFR calc Af Amer 83 >59 mL/min/1.73   BUN/Creatinine Ratio 20 12 - 28   Sodium 133 (L) 134 - 144 mmol/L   Potassium 4.5 3.5 - 5.2 mmol/L   Chloride 93 (L) 96 - 106 mmol/L   CO2 24 18 - 29 mmol/L   Calcium 9.7 8.7 - 10.3 mg/dL   Total Protein 6.8 6.0 - 8.5 g/dL   Albumin 4.1 3.5 - 4.8 g/dL   Globulin, Total 2.7 1.5 - 4.5 g/dL   Albumin/Globulin Ratio 1.5 1.2 - 2.2   Bilirubin Total 0.4 0.0 - 1.2 mg/dL   Alkaline Phosphatase 89 39 - 117 IU/L   AST 15 0 - 40 IU/L   ALT 18 0 - 32 IU/L  Sed Rate (ESR)  Result Value Ref Range   Sed Rate 3 0 - 40 mm/hr  VITAMIN D 25 Hydroxy (Vit-D Deficiency, Fractures)  Result Value Ref Range   Vit D, 25-Hydroxy 18.6 (L) 30.0 - 100.0 ng/mL  Antinuclear Antib (ANA)  Result Value Ref Range   Anit Nuclear Antibody(ANA) Negative Negative      Assessment & Plan:   Problem List Items Addressed This Visit    None    Visit Diagnoses    Adverse effect of drug, initial encounter    -  Primary   Will treat with kenalog injection, benadryl, 2nd generation antihistamines and zantac. Recheck 3 days. Will alert rheumatologist about reaction.    Relevant Medications   triamcinolone acetonide (KENALOG-40) injection 80 mg (Start on 06/17/2017  3:30 PM)       Follow up plan: Return Thursday, for follow up drug reaction.

## 2017-06-19 DIAGNOSIS — M1712 Unilateral primary osteoarthritis, left knee: Secondary | ICD-10-CM | POA: Diagnosis not present

## 2017-06-20 ENCOUNTER — Ambulatory Visit: Payer: Self-pay | Admitting: Family Medicine

## 2017-06-28 ENCOUNTER — Ambulatory Visit: Payer: Medicare Other | Admitting: Family Medicine

## 2017-07-01 ENCOUNTER — Ambulatory Visit: Payer: Medicare Other | Admitting: Family Medicine

## 2017-07-02 DIAGNOSIS — R21 Rash and other nonspecific skin eruption: Secondary | ICD-10-CM | POA: Diagnosis not present

## 2017-07-02 DIAGNOSIS — L820 Inflamed seborrheic keratosis: Secondary | ICD-10-CM | POA: Diagnosis not present

## 2017-07-02 DIAGNOSIS — L821 Other seborrheic keratosis: Secondary | ICD-10-CM | POA: Diagnosis not present

## 2017-07-15 ENCOUNTER — Ambulatory Visit (INDEPENDENT_AMBULATORY_CARE_PROVIDER_SITE_OTHER): Payer: Medicare Other | Admitting: Family Medicine

## 2017-07-15 ENCOUNTER — Encounter: Payer: Self-pay | Admitting: Family Medicine

## 2017-07-15 VITALS — BP 134/75 | HR 48 | Temp 98.6°F | Wt 137.0 lb

## 2017-07-15 DIAGNOSIS — I10 Essential (primary) hypertension: Secondary | ICD-10-CM

## 2017-07-15 DIAGNOSIS — R21 Rash and other nonspecific skin eruption: Secondary | ICD-10-CM

## 2017-07-15 NOTE — Patient Instructions (Addendum)
Cera Ve moisturizer twice daily Can put half tube or more of the Triamcinolone cream straight into the tub of Cera Ve and mix well Leave some triamcinolone in the tube for spot treatment as needed  No hot water in the shower, no scented products  Stop metoprolol, continue to monitor home readings.

## 2017-07-15 NOTE — Assessment & Plan Note (Signed)
Discussed with patient that her fatigue may be in part from bradycardia. States she has lost a fair amount of weight lately and would be interested in trying to come off her metoprolol. Will do a trial of this and recheck in 1 month. Continue diovan in the meantime

## 2017-07-15 NOTE — Progress Notes (Signed)
BP 134/75   Pulse (!) 48   Temp 98.6 F (37 C)   Wt 137 lb (62.1 kg)   SpO2 99%   BMI 24.82 kg/m    Subjective:    Patient ID: Michele Meyer, female    DOB: 1946-06-23, 71 y.o.   MRN: [id number]  HPI: Michele Meyer is a 71 y.o. female  Chief Complaint  Patient presents with  . blisters    broke out in a blistery rash after having an infusion for osteoporosis. Derm prescribed her Triamcinalone cream but she didn't pick it up. She states it is improving in some areas. Back still burns. She feels fatigued and would like lab work.   Patient presents today for recheck of her peeling rash that seems to have been an allergic reaction to a reclast infusion. Was told to take benadryl and use triamcinolone cream by both her PCP and Dermatologist but has not gotten either. Rash is clearing up, still some peeling skin on palms and soles and back. Has not been moisturizing regularly. Pt very concerned about the reaction and wanting her internal organs checked as well to make sure they weren't affected. Just feels very run down and fatigued.   Past Medical History:  Diagnosis Date  . Anxiety   . Arthritis   . Bladder filling defect    bladder sling protrusing last 3 years   . Chronic cystitis   . Cyst of right kidney   . Cystocele   . Depression   . Diverticulosis   . Dyslipidemia   . Endometriosis   . Gestational diabetes mellitus 30 years ago   with pregnancy   . Glaucoma    both eyes  . Incomplete bladder emptying   . Labile hypertension   . PONV (postoperative nausea and vomiting)   . Skin cancer   . Skin cancer    basal and squamous cell  . Stress incontinence   . Uterovaginal prolapse, incomplete   . UTI (lower urinary tract infection)    Social History   Social History  . Marital status: Married    Spouse name: N/A  . Number of children: 5  . Years of education: N/A   Occupational History  .  Retired   Social History Main Topics  . Smoking status: Former  Smoker    Quit date: 02/25/1964  . Smokeless tobacco: Never Used     Comment: smoked for two months  . Alcohol use Yes     Comment: rarely  . Drug use: No  . Sexual activity: Not Currently   Other Topics Concern  . Not on file   Social History Narrative   Married, mother of 12, with at least one granddaughter who is present today.   Daily Caffeine Use:  2 cups in am;   She does not exercise routinely. Former smoker who quit in 1965.   Takes occasional alcohol beverage.   As the name is [place] granddaughter's name is Taresa [place]   Relevant past medical, surgical, family and social history reviewed and updated as indicated. Interim medical history since our last visit reviewed. Allergies and medications reviewed and updated.  Review of Systems  Constitutional: Positive for fatigue.  HENT: Negative.   Respiratory: Negative.   Cardiovascular: Negative.   Gastrointestinal: Negative.   Genitourinary: Negative.   Musculoskeletal: Negative.   Skin: Positive for rash.  Neurological: Negative.   Psychiatric/Behavioral: Negative.    Per HPI unless specifically indicated above  Objective:    BP 134/75   Pulse (!) 48   Temp 98.6 F (37 C)   Wt 137 lb (62.1 kg)   SpO2 99%   BMI 24.82 kg/m   Wt Readings from Last 3 Encounters:  07/15/17 137 lb (62.1 kg)  06/17/17 138 lb 7 oz (62.8 kg)  03/11/17 137 lb (62.1 kg)    Physical Exam  Constitutional: She is oriented to person, place, and time. She appears well-developed and well-nourished. No distress.  HENT:  Head: Atraumatic.  Eyes: Pupils are equal, round, and reactive to light. Conjunctivae are normal.  Neck: Normal range of motion. Neck supple.  Cardiovascular: Normal rate and normal heart sounds.   Pulmonary/Chest: Effort normal and breath sounds normal. No respiratory distress.  Musculoskeletal: Normal range of motion.  Neurological: She is alert and oriented to person, place, and time.  Skin: Skin is warm and dry.    Skin is overall fairly dry and flaky Palms and soles still with some flaking skin left from rash Several scabbed over dry patches across upper back  Psychiatric: She has a normal mood and affect. Her behavior is normal.  Nursing note and vitals reviewed.  Results for orders placed or performed in visit on 03/11/17  Uric acid  Result Value Ref Range   Uric Acid 3.9 2.5 - 7.1 mg/dL  RA Qn+CCP(IgG/A)+SjoSSA+SjoSSB  Result Value Ref Range   Rhuematoid fact SerPl-aCnc 90.9 (H) 0.0 - 13.9 IU/mL   ENA SSA (RO) Ab <0.2 0.0 - 0.9 AI   ENA SSB (LA) Ab <0.2 0.0 - 0.9 AI   Cyclic Citrullin Peptide Ab 3 0 - 19 units  CBC with Differential/Platelet  Result Value Ref Range   WBC 8.3 3.4 - 10.8 x10E3/uL   RBC 4.39 3.77 - 5.28 x10E6/uL   Hemoglobin 12.4 11.1 - 15.9 g/dL   Hematocrit 37.0 34.0 - 46.6 %   MCV 84 79 - 97 fL   MCH 28.2 26.6 - 33.0 pg   MCHC 33.5 31.5 - 35.7 g/dL   RDW 13.7 12.3 - 15.4 %   Platelets 271 150 - 379 x10E3/uL   Neutrophils 65 Not Estab. %   Lymphs 20 Not Estab. %   Monocytes 10 Not Estab. %   Eos 5 Not Estab. %   Basos 0 Not Estab. %   Neutrophils Absolute 5.4 1.4 - 7.0 x10E3/uL   Lymphocytes Absolute 1.6 0.7 - 3.1 x10E3/uL   Monocytes Absolute 0.8 0.1 - 0.9 x10E3/uL   EOS (ABSOLUTE) 0.4 0.0 - 0.4 x10E3/uL   Basophils Absolute 0.0 0.0 - 0.2 x10E3/uL   Immature Granulocytes 0 Not Estab. %   Immature Grans (Abs) 0.0 0.0 - 0.1 x10E3/uL  Comprehensive metabolic panel  Result Value Ref Range   Glucose 92 65 - 99 mg/dL   BUN 16 8 - 27 mg/dL   Creatinine, Ser 0.82 0.57 - 1.00 mg/dL   GFR calc non Af Amer 72 >59 mL/min/1.73   GFR calc Af Amer 83 >59 mL/min/1.73   BUN/Creatinine Ratio 20 12 - 28   Sodium 133 (L) 134 - 144 mmol/L   Potassium 4.5 3.5 - 5.2 mmol/L   Chloride 93 (L) 96 - 106 mmol/L   CO2 24 18 - 29 mmol/L   Calcium 9.7 8.7 - 10.3 mg/dL   Total Protein 6.8 6.0 - 8.5 g/dL   Albumin 4.1 3.5 - 4.8 g/dL   Globulin, Total 2.7 1.5 - 4.5 g/dL    Albumin/Globulin Ratio 1.5 1.2 - 2.2  Bilirubin Total 0.4 0.0 - 1.2 mg/dL   Alkaline Phosphatase 89 39 - 117 IU/L   AST 15 0 - 40 IU/L   ALT 18 0 - 32 IU/L  Sed Rate (ESR)  Result Value Ref Range   Sed Rate 3 0 - 40 mm/hr  VITAMIN D 25 Hydroxy (Vit-D Deficiency, Fractures)  Result Value Ref Range   Vit D, 25-Hydroxy 18.6 (L) 30.0 - 100.0 ng/mL  Antinuclear Antib (ANA)  Result Value Ref Range   Anit Nuclear Antibody(ANA) Negative Negative      Assessment & Plan:   Problem List Items Addressed This Visit      Cardiovascular and Mediastinum   Hypertension (Chronic)    Discussed with patient that her fatigue may be in part from bradycardia. States she has lost a fair amount of weight lately and would be interested in trying to come off her metoprolol. Will do a trial of this and recheck in 1 month. Continue diovan in the meantime      Relevant Orders   CBC with Differential/Platelet   Comprehensive metabolic panel    Other Visit Diagnoses    Rash    -  Primary   Recommended cera ve BID, no hot shower water or scented products, and triamcinolone cream prn. F/u if still not resolving       Follow up plan: Return in about 4 weeks (around 08/12/2017) for BP check.

## 2017-07-16 ENCOUNTER — Encounter: Payer: Self-pay | Admitting: Family Medicine

## 2017-07-16 LAB — CBC WITH DIFFERENTIAL/PLATELET
Basophils Absolute: 0 10*3/uL (ref 0.0–0.2)
Basos: 1 %
EOS (ABSOLUTE): 0.4 10*3/uL (ref 0.0–0.4)
EOS: 6 %
HEMATOCRIT: 35.9 % (ref 34.0–46.6)
Hemoglobin: 12.5 g/dL (ref 11.1–15.9)
IMMATURE GRANULOCYTES: 0 %
Immature Grans (Abs): 0 10*3/uL (ref 0.0–0.1)
Lymphocytes Absolute: 0.9 10*3/uL (ref 0.7–3.1)
Lymphs: 16 %
MCH: 28.9 pg (ref 26.6–33.0)
MCHC: 34.8 g/dL (ref 31.5–35.7)
MCV: 83 fL (ref 79–97)
MONOCYTES: 10 %
Monocytes Absolute: 0.6 10*3/uL (ref 0.1–0.9)
NEUTROS PCT: 67 %
Neutrophils Absolute: 3.9 10*3/uL (ref 1.4–7.0)
Platelets: 226 10*3/uL (ref 150–379)
RBC: 4.32 x10E6/uL (ref 3.77–5.28)
RDW: 13.3 % (ref 12.3–15.4)
WBC: 5.8 10*3/uL (ref 3.4–10.8)

## 2017-07-16 LAB — COMPREHENSIVE METABOLIC PANEL
ALT: 18 IU/L (ref 0–32)
AST: 17 IU/L (ref 0–40)
Albumin/Globulin Ratio: 1.6 (ref 1.2–2.2)
Albumin: 4.3 g/dL (ref 3.5–4.8)
Alkaline Phosphatase: 72 IU/L (ref 39–117)
BUN/Creatinine Ratio: 17 (ref 12–28)
BUN: 15 mg/dL (ref 8–27)
Bilirubin Total: 0.6 mg/dL (ref 0.0–1.2)
CALCIUM: 9.4 mg/dL (ref 8.7–10.3)
CO2: 25 mmol/L (ref 20–29)
CREATININE: 0.9 mg/dL (ref 0.57–1.00)
Chloride: 92 mmol/L — ABNORMAL LOW (ref 96–106)
GFR calc Af Amer: 74 mL/min/{1.73_m2} (ref >59)
GFR, EST NON AFRICAN AMERICAN: 64 mL/min/{1.73_m2} (ref >59)
GLOBULIN, TOTAL: 2.7 g/dL (ref 1.5–4.5)
Glucose: 91 mg/dL (ref 65–99)
Potassium: 4.7 mmol/L (ref 3.5–5.2)
SODIUM: 131 mmol/L — AB (ref 134–144)
Total Protein: 7 g/dL (ref 6.0–8.5)

## 2017-07-24 DIAGNOSIS — M1712 Unilateral primary osteoarthritis, left knee: Secondary | ICD-10-CM | POA: Diagnosis not present

## 2017-07-31 DIAGNOSIS — M1712 Unilateral primary osteoarthritis, left knee: Secondary | ICD-10-CM | POA: Diagnosis not present

## 2017-08-07 DIAGNOSIS — M1712 Unilateral primary osteoarthritis, left knee: Secondary | ICD-10-CM | POA: Diagnosis not present

## 2017-08-08 ENCOUNTER — Telehealth: Payer: Self-pay | Admitting: Family Medicine

## 2017-08-09 ENCOUNTER — Telehealth: Payer: Self-pay

## 2017-08-09 MED ORDER — LOSARTAN POTASSIUM-HCTZ 100-25 MG PO TABS: 1.0000 | ORAL_TABLET | Freq: Every day | ORAL | 0 refills | Status: DC

## 2017-08-09 NOTE — Telephone Encounter (Signed)
Received a fax from [facility] regarding the recall on patient's valsartan/hctz. Alternatives provided are Azilsartan, Candesartan, Eprosartan, Irbesartan, Losartan, Olmesartan, and Telmisartan. Can patient be switched to one of these alternatives?

## 2017-08-09 NOTE — Telephone Encounter (Signed)
Called and let patient's husband (signed DPR) know that the patient's medication has been changed due to a recall. Patient's husband stated that he would let the patient know.

## 2017-08-12 NOTE — Telephone Encounter (Signed)
Error

## 2017-08-15 ENCOUNTER — Encounter: Payer: Self-pay | Admitting: Family Medicine

## 2017-08-15 ENCOUNTER — Ambulatory Visit (INDEPENDENT_AMBULATORY_CARE_PROVIDER_SITE_OTHER): Payer: Medicare Other | Admitting: Family Medicine

## 2017-08-15 VITALS — BP 148/70 | HR 71 | Temp 98.7°F | Wt 138.0 lb

## 2017-08-15 DIAGNOSIS — I10 Essential (primary) hypertension: Secondary | ICD-10-CM | POA: Diagnosis not present

## 2017-08-15 DIAGNOSIS — Z713 Dietary counseling and surveillance: Secondary | ICD-10-CM | POA: Diagnosis not present

## 2017-08-15 DIAGNOSIS — R4789 Other speech disturbances: Secondary | ICD-10-CM

## 2017-08-15 MED ORDER — METOPROLOL SUCCINATE ER 25 MG PO TB24: 12.5000 mg | ORAL_TABLET | Freq: Every day | ORAL | 1 refills | Status: DC

## 2017-08-15 MED ORDER — TRAZODONE HCL 50 MG PO TABS: 25.0000 mg | ORAL_TABLET | Freq: Every day | ORAL | 2 refills | Status: DC

## 2017-08-15 NOTE — Progress Notes (Signed)
BP (!) 148/70   Pulse 71   Temp 98.7 F (37.1 C)   Wt 138 lb (62.6 kg)   SpO2 99%   BMI 25.00 kg/m    Subjective:    Patient ID: Michele Meyer, female    DOB: 1946/10/16, 71 y.o.   MRN: [id number]  HPI: Michele Meyer is a 71 y.o. female  Chief Complaint  Patient presents with  . Hypertension    1 month f/up, states she just took her BP medication about 30 minutes ago    Having some word finding issues since having the reaction to the reclast. She notes that it is mainly in the AM when she first wakes up and it tends to get better as the day goes on. Very interested in starting isagenics, a dietary supplement and shake system. Still under a lot of stress, but would like to come off her zoloft. Willing to wait on that.   HYPERTENSION- stopped her metoprolol and felt terrible, has restarted it taking 1/2 a pill of her metoprolol daily Hypertension status: stable  Satisfied with current treatment? yes Duration of hypertension: chronic BP monitoring frequency:  a few times a week BP range: 130s/70s BP medication side effects:  no Medication compliance: good compliance Aspirin: no Recurrent headaches: no Visual changes: no Palpitations: no Dyspnea: no Chest pain: no Lower extremity edema: no Dizzy/lightheaded: no   Relevant past medical, surgical, family and social history reviewed and updated as indicated. Interim medical history since our last visit reviewed. Allergies and medications reviewed and updated.  Review of Systems  Constitutional: Negative.   Respiratory: Negative.   Cardiovascular: Negative.   Neurological: Negative.   Psychiatric/Behavioral: Negative.     Per HPI unless specifically indicated above     Objective:    BP (!) 148/70   Pulse 71   Temp 98.7 F (37.1 C)   Wt 138 lb (62.6 kg)   SpO2 99%   BMI 25.00 kg/m   Wt Readings from Last 3 Encounters:  08/15/17 138 lb (62.6 kg)  07/15/17 137 lb (62.1 kg)  06/17/17 138 lb 7 oz (62.8 kg)      Physical Exam  Constitutional: She is oriented to person, place, and time. She appears well-developed and well-nourished. No distress.  HENT:  Head: Normocephalic and atraumatic.  Right Ear: Hearing normal.  Left Ear: Hearing normal.  Nose: Nose normal.  Eyes: Conjunctivae and lids are normal. Right eye exhibits no discharge. Left eye exhibits no discharge. No scleral icterus.  Cardiovascular: Normal rate, regular rhythm, normal heart sounds and intact distal pulses.  Exam reveals no gallop and no friction rub.   No murmur heard. Pulmonary/Chest: Effort normal and breath sounds normal. No respiratory distress. She has no wheezes. She has no rales. She exhibits no tenderness.  Musculoskeletal: Normal range of motion.  Neurological: She is alert and oriented to person, place, and time.  Skin: Skin is warm, dry and intact. No rash noted. She is not diaphoretic. No erythema. No pallor.  Psychiatric: She has a normal mood and affect. Her speech is normal and behavior is normal. Judgment and thought content normal. Cognition and memory are normal.  Nursing note and vitals reviewed.   Results for orders placed or performed in visit on 07/15/17  CBC with Differential/Platelet  Result Value Ref Range   WBC 5.8 3.4 - 10.8 x10E3/uL   RBC 4.32 3.77 - 5.28 x10E6/uL   Hemoglobin 12.5 11.1 - 15.9 g/dL   Hematocrit 35.9 34.0 -  46.6 %   MCV 83 79 - 97 fL   MCH 28.9 26.6 - 33.0 pg   MCHC 34.8 31.5 - 35.7 g/dL   RDW 13.3 12.3 - 15.4 %   Platelets 226 150 - 379 x10E3/uL   Neutrophils 67 Not Estab. %   Lymphs 16 Not Estab. %   Monocytes 10 Not Estab. %   Eos 6 Not Estab. %   Basos 1 Not Estab. %   Neutrophils Absolute 3.9 1.4 - 7.0 x10E3/uL   Lymphocytes Absolute 0.9 0.7 - 3.1 x10E3/uL   Monocytes Absolute 0.6 0.1 - 0.9 x10E3/uL   EOS (ABSOLUTE) 0.4 0.0 - 0.4 x10E3/uL   Basophils Absolute 0.0 0.0 - 0.2 x10E3/uL   Immature Granulocytes 0 Not Estab. %   Immature Grans (Abs) 0.0 0.0 - 0.1  x10E3/uL  Comprehensive metabolic panel  Result Value Ref Range   Glucose 91 65 - 99 mg/dL   BUN 15 8 - 27 mg/dL   Creatinine, Ser 0.90 0.57 - 1.00 mg/dL   GFR calc non Af Amer 64 >59 mL/min/1.73   GFR calc Af Amer 74 >59 mL/min/1.73   BUN/Creatinine Ratio 17 12 - 28   Sodium 131 (L) 134 - 144 mmol/L   Potassium 4.7 3.5 - 5.2 mmol/L   Chloride 92 (L) 96 - 106 mmol/L   CO2 25 20 - 29 mmol/L   Calcium 9.4 8.7 - 10.3 mg/dL   Total Protein 7.0 6.0 - 8.5 g/dL   Albumin 4.3 3.5 - 4.8 g/dL   Globulin, Total 2.7 1.5 - 4.5 g/dL   Albumin/Globulin Ratio 1.6 1.2 - 2.2   Bilirubin Total 0.6 0.0 - 1.2 mg/dL   Alkaline Phosphatase 72 39 - 117 IU/L   AST 17 0 - 40 IU/L   ALT 18 0 - 32 IU/L      Assessment & Plan:   Problem List Items Addressed This Visit      Cardiovascular and Mediastinum   Hypertension (Chronic)    Still elevated. Better at home. Only took her medicine less than 30 minutes ago. Will continue to monitor and recheck in about a month.       Relevant Medications   valsartan-hydrochlorothiazide (DIOVAN-HCT) 160-25 MG tablet   metoprolol succinate (TOPROL-XL) 25 MG 24 hr tablet    Other Visit Diagnoses    Word finding difficulty    -  Primary   MMSE normal. Likely due to hang over from trazodone. Will wean off. Checking labs. Recheck by phone 2-3 weeks   Relevant Orders   Comprehensive metabolic panel   CBC with Differential/Platelet   Thyroid Panel With TSH   Dietary counseling       Interested in [place]. Discussed with patient today. Will check CMP about a month after she starts it to make sure potassium and liver functions are stable.        Follow up plan: Return 4 weeks after starting isagenics.

## 2017-08-15 NOTE — Assessment & Plan Note (Signed)
Still elevated. Better at home. Only took her medicine less than 30 minutes ago. Will continue to monitor and recheck in about a month.

## 2017-08-16 ENCOUNTER — Encounter: Payer: Self-pay | Admitting: Family Medicine

## 2017-08-16 LAB — CBC WITH DIFFERENTIAL/PLATELET
BASOS: 0 %
Basophils Absolute: 0 10*3/uL (ref 0.0–0.2)
EOS (ABSOLUTE): 0.3 10*3/uL (ref 0.0–0.4)
Eos: 3 %
Hematocrit: 37.5 % (ref 34.0–46.6)
Hemoglobin: 13.1 g/dL (ref 11.1–15.9)
IMMATURE GRANS (ABS): 0 10*3/uL (ref 0.0–0.1)
Immature Granulocytes: 0 %
LYMPHS ABS: 1.1 10*3/uL (ref 0.7–3.1)
LYMPHS: 14 %
MCH: 30 pg (ref 26.6–33.0)
MCHC: 34.9 g/dL (ref 31.5–35.7)
MCV: 86 fL (ref 79–97)
Monocytes Absolute: 0.6 10*3/uL (ref 0.1–0.9)
Monocytes: 7 %
NEUTROS ABS: 6.1 10*3/uL (ref 1.4–7.0)
Neutrophils: 76 %
PLATELETS: 288 10*3/uL (ref 150–379)
RBC: 4.36 x10E6/uL (ref 3.77–5.28)
RDW: 13.1 % (ref 12.3–15.4)
WBC: 8.1 10*3/uL (ref 3.4–10.8)

## 2017-08-16 LAB — COMPREHENSIVE METABOLIC PANEL
A/G RATIO: 1.9 (ref 1.2–2.2)
ALBUMIN: 4.7 g/dL (ref 3.5–4.8)
ALT: 16 IU/L (ref 0–32)
AST: 23 IU/L (ref 0–40)
Alkaline Phosphatase: 72 IU/L (ref 39–117)
BILIRUBIN TOTAL: 0.5 mg/dL (ref 0.0–1.2)
BUN / CREAT RATIO: 15 (ref 12–28)
BUN: 13 mg/dL (ref 8–27)
CALCIUM: 10.3 mg/dL (ref 8.7–10.3)
CO2: 29 mmol/L (ref 20–29)
Chloride: 90 mmol/L — ABNORMAL LOW (ref 96–106)
Creatinine, Ser: 0.87 mg/dL (ref 0.57–1.00)
GFR, EST AFRICAN AMERICAN: 77 mL/min/{1.73_m2} (ref >59)
GFR, EST NON AFRICAN AMERICAN: 67 mL/min/{1.73_m2} (ref >59)
GLOBULIN, TOTAL: 2.5 g/dL (ref 1.5–4.5)
Glucose: 79 mg/dL (ref 65–99)
POTASSIUM: 5 mmol/L (ref 3.5–5.2)
SODIUM: 133 mmol/L — AB (ref 134–144)
TOTAL PROTEIN: 7.2 g/dL (ref 6.0–8.5)

## 2017-08-16 LAB — THYROID PANEL WITH TSH
FREE THYROXINE INDEX: 1.8 (ref 1.2–4.9)
T3 Uptake Ratio: 25 % (ref 24–39)
T4, Total: 7.2 ug/dL (ref 4.5–12.0)
TSH: 1.44 u[IU]/mL (ref 0.450–4.500)

## 2017-09-05 ENCOUNTER — Other Ambulatory Visit: Payer: Self-pay | Admitting: Unknown Physician Specialty

## 2017-09-05 ENCOUNTER — Telehealth: Payer: Self-pay | Admitting: Family Medicine

## 2017-09-05 NOTE — Telephone Encounter (Signed)
Called and checked in on her memory- doing a lot better. She is concerned about her elevated RF. Advised her to follow up with rheumatology. Call with any concerns.

## 2017-09-05 NOTE — Telephone Encounter (Signed)
-----   Message from Valerie Roys, [place] sent at 08/15/2017 12:19 PM EDT ----- Call about her memory

## 2017-09-17 ENCOUNTER — Telehealth: Payer: Self-pay | Admitting: Family Medicine

## 2017-09-17 NOTE — Telephone Encounter (Signed)
Called pt to schedule for Annual Wellness Visit with Nurse Health Advisor, Tiffany Hill, my c/b # is [phone]  Kathryn Brown ° °

## 2017-09-25 ENCOUNTER — Telehealth: Payer: Self-pay | Admitting: Family Medicine

## 2017-09-25 NOTE — Telephone Encounter (Signed)
Called pt to sched for Annual Wellness Visit with Nurse Health Advisor. C/b #  [phone]  Kathryn Brown ° °

## 2017-10-01 ENCOUNTER — Telehealth: Payer: Self-pay

## 2017-10-01 NOTE — Telephone Encounter (Signed)
Called to r/s medicare wellness visit with patient.   Patient states she has developed a rash on her back and chest. She states she was started on a new BP medication about 2-3 weeks ago- Losartan/HCTZ (noted change on 08/09/2017 due to recall of valsartan) Patients current record shows valsartan-hctz 160-25 mg.  Dr.Johnson, can you clarify this? And does patient need to come in to have rash checked? She does report she has not been taking her BP at home so she is not sure how the losartan has been working.

## 2017-10-01 NOTE — Telephone Encounter (Signed)
Can we check to see if the valsartan recall is still in effect, or if I can give her her valsartan back? That will determine if we can switch her back over and see if the rash goes away.

## 2017-10-01 NOTE — Telephone Encounter (Signed)
Spoke with pharmacy, they have Valsartan, they will fill it. Verbal from [place] [facility] with 1 refill.  Patient notified via voicemail.

## 2017-10-02 ENCOUNTER — Ambulatory Visit: Payer: Medicare Other

## 2017-10-16 ENCOUNTER — Telehealth: Payer: Self-pay

## 2017-10-16 NOTE — Telephone Encounter (Signed)
Copied from [place] #[id number]. Topic: Quick Communication - See Telephone Encounter >> Oct 16, 2017 10:51 AM Burnis Medin, NT wrote: CRM for notification. See Telephone encounter for:  10/16/17. Pt. Called in and was concerned about her checkup at home and wanted to be certain if she still see doctor Wynetta Emery still even if someone comes to her home. Pt would like a call back.

## 2017-10-16 NOTE — Telephone Encounter (Signed)
Called and let patient know that she still needs to come in for an office visit.

## 2017-10-21 ENCOUNTER — Ambulatory Visit: Payer: Self-pay

## 2017-11-13 ENCOUNTER — Telehealth: Payer: Self-pay | Admitting: Family Medicine

## 2017-11-13 MED ORDER — ALPRAZOLAM 0.5 MG PO TABS: 0.5000 mg | ORAL_TABLET | Freq: Every day | ORAL | 0 refills | Status: DC | PRN

## 2017-11-13 NOTE — Telephone Encounter (Signed)
Rx written. OK to call in. Thanks!

## 2017-11-13 NOTE — Telephone Encounter (Signed)
Copied from [place]. Topic: General - Other >> Nov 13, 2017  9:43 AM Conception Chancy, NT wrote: Reason for CRM: pt is wanting Dr. Wynetta Emery nurse to give her a call, she states she used to be on xanax and would like to get a refill on it, states her brother in law just passed away and a lot of family is coming in town and feels she will do better with the medication until after. States her pharmacy is closed tomorrow and would like a call back today if possible.

## 2017-11-13 NOTE — Telephone Encounter (Signed)
RX called in. Message left to make pt aware.

## 2017-11-22 DIAGNOSIS — M1712 Unilateral primary osteoarthritis, left knee: Secondary | ICD-10-CM | POA: Diagnosis not present

## 2017-12-05 ENCOUNTER — Other Ambulatory Visit: Payer: Self-pay | Admitting: Family Medicine

## 2017-12-30 DIAGNOSIS — M069 Rheumatoid arthritis, unspecified: Secondary | ICD-10-CM | POA: Diagnosis not present

## 2017-12-30 DIAGNOSIS — Z79899 Other long term (current) drug therapy: Secondary | ICD-10-CM | POA: Diagnosis not present

## 2017-12-30 DIAGNOSIS — H40003 Preglaucoma, unspecified, bilateral: Secondary | ICD-10-CM | POA: Diagnosis not present

## 2018-01-02 ENCOUNTER — Telehealth: Payer: Self-pay | Admitting: Family Medicine

## 2018-01-02 MED ORDER — VALSARTAN-HYDROCHLOROTHIAZIDE 160-25 MG PO TABS: 1.0000 | ORAL_TABLET | Freq: Every day | ORAL | 1 refills | Status: DC

## 2018-01-02 NOTE — Telephone Encounter (Signed)
Copied from [place] [phone]. Topic: Quick Communication - Rx Refill/Question >> Jan 02, 2018 12:44 PM Tye [place] wrote: Medication: valsartan-hydrochlorothiazide (DIOVAN-HCT) 160-25 MG tablet [[id number]]  Has the patient contacted their pharmacy? yes  Pt saw a medical report about medication and wondered if she needs to continue to take, pt told to contact pharmacist, advise if needed

## 2018-01-03 ENCOUNTER — Telehealth: Payer: Self-pay | Admitting: Family Medicine

## 2018-01-03 ENCOUNTER — Ambulatory Visit: Payer: Medicare Other | Admitting: Unknown Physician Specialty

## 2018-01-03 NOTE — Telephone Encounter (Signed)
Included in pt. Notes that pt. Would like labs to be drawn during rescheduled appt.

## 2018-01-03 NOTE — Telephone Encounter (Signed)
Copied from [place] [id number]. Topic: Quick Communication - See Telephone Encounter >> Jan 03, 2018  1:43 PM Cleaster Corin, NT wrote: CRM for notification. See Telephone encounter for:   01/03/18. Pt. Called to cancel appt. Rescheduled pt. For 01-20-18 she would also like to have labs done at this appt. (RA). Pt. Can be reached at [phone]

## 2018-01-14 ENCOUNTER — Other Ambulatory Visit: Payer: Self-pay | Admitting: Family Medicine

## 2018-01-15 NOTE — Telephone Encounter (Signed)
Medication refill. Last office visit 08/15/17. Thanks.

## 2018-01-20 ENCOUNTER — Ambulatory Visit: Payer: Medicare Other | Admitting: Family Medicine

## 2018-01-20 ENCOUNTER — Encounter: Payer: Self-pay | Admitting: Family Medicine

## 2018-01-20 VITALS — BP 157/81 | HR 72 | Temp 98.5°F | Wt 143.4 lb

## 2018-01-20 DIAGNOSIS — M810 Age-related osteoporosis without current pathological fracture: Secondary | ICD-10-CM

## 2018-01-20 DIAGNOSIS — F411 Generalized anxiety disorder: Secondary | ICD-10-CM | POA: Diagnosis not present

## 2018-01-20 DIAGNOSIS — Z1211 Encounter for screening for malignant neoplasm of colon: Secondary | ICD-10-CM | POA: Diagnosis not present

## 2018-01-20 DIAGNOSIS — I10 Essential (primary) hypertension: Secondary | ICD-10-CM

## 2018-01-20 DIAGNOSIS — E782 Mixed hyperlipidemia: Secondary | ICD-10-CM

## 2018-01-20 DIAGNOSIS — R768 Other specified abnormal immunological findings in serum: Secondary | ICD-10-CM

## 2018-01-20 DIAGNOSIS — F3341 Major depressive disorder, recurrent, in partial remission: Secondary | ICD-10-CM

## 2018-01-20 DIAGNOSIS — N393 Stress incontinence (female) (male): Secondary | ICD-10-CM

## 2018-01-20 DIAGNOSIS — E162 Hypoglycemia, unspecified: Secondary | ICD-10-CM

## 2018-01-20 DIAGNOSIS — M26623 Arthralgia of bilateral temporomandibular joint: Secondary | ICD-10-CM

## 2018-01-20 DIAGNOSIS — K219 Gastro-esophageal reflux disease without esophagitis: Secondary | ICD-10-CM

## 2018-01-20 MED ORDER — TRAZODONE HCL 50 MG PO TABS: 25.0000 mg | ORAL_TABLET | Freq: Every day | ORAL | 1 refills | Status: DC

## 2018-01-20 MED ORDER — SERTRALINE HCL 100 MG PO TABS: 50.0000 mg | ORAL_TABLET | Freq: Every day | ORAL | 1 refills | Status: DC

## 2018-01-20 MED ORDER — METOPROLOL SUCCINATE ER 25 MG PO TB24: 12.5000 mg | ORAL_TABLET | Freq: Every day | ORAL | 1 refills | Status: DC

## 2018-01-20 MED ORDER — PANTOPRAZOLE SODIUM 20 MG PO TBEC: 40.0000 mg | DELAYED_RELEASE_TABLET | Freq: Every day | ORAL | 1 refills | Status: DC

## 2018-01-20 MED ORDER — CYCLOBENZAPRINE HCL 10 MG PO TABS: 10.0000 mg | ORAL_TABLET | Freq: Every day | ORAL | 3 refills | Status: DC

## 2018-01-20 MED ORDER — VALSARTAN-HYDROCHLOROTHIAZIDE 160-25 MG PO TABS: 1.0000 | ORAL_TABLET | Freq: Every day | ORAL | 1 refills | Status: DC

## 2018-01-20 NOTE — Assessment & Plan Note (Signed)
Rechecking levels today. Await results. Call with any concerns.  

## 2018-01-20 NOTE — Assessment & Plan Note (Signed)
Not doing well. Taking the zoloft and stable but with a lot of family stress. Continue current regimen. Continue to monitor. Call with any concerns. Recheck 3-4 months.

## 2018-01-20 NOTE — Progress Notes (Signed)
BP (!) 157/81 (BP Location: Left Arm, Cuff Size: Normal)   Pulse 72   Temp 98.5 F (36.9 C)   Wt 143 lb 7 oz (65.1 kg)   SpO2 99%   BMI 25.98 kg/m    Subjective:    Patient ID: Michele Meyer, female    DOB: 01-01-1946, 72 y.o.   MRN: [id number]  HPI: Michele Meyer is a 72 y.o. female  Chief Complaint  Patient presents with  . Hypertension  . Hyperlipidemia  . Depression   EAR PAIN Duration: about a month Involved ear(s): bilateral Severity:  moderate  Quality:  Sharp and aching Fever: no Otorrhea: no Upper respiratory infection symptoms: no Pruritus: no Hearing loss: no Water immersion no Using Q-tips: no Recurrent otitis media: no Status: fluctuating Treatments attempted: none  Osteoprosis- has not been on any medicine since she had the bad reaction to reclast in June. Had infusion of reclast, so is good for the year, but is concerned about her osteoporosis.   Inflammatory Arthritis- did not start the plaquinel as prescribed. She continues to have lots of issues with her joints and lots of pain.  Having issues with her teeth- having jaw pain. Seeing the dentist tomorrow.   HYPERTENSION / HYPERLIPIDEMIA Satisfied with current treatment? yes Duration of hypertension: chronic BP monitoring frequency: not checking BP medication side effects: no Past BP meds: metoprolol, valsartan, HCTZ Duration of hyperlipidemia: chronic Cholesterol medication side effects: Not on anything Cholesterol supplements: none Past cholesterol medications: none Medication compliance: excellent compliance Aspirin: no Recent stressors: yes Recurrent headaches: yes Visual changes: no Palpitations: no Dyspnea: no Chest pain: no Lower extremity edema: no Dizzy/lightheaded: no  DEPRESSION- has a lot going on. Her son and her daughter-in-law are separating and he is not going to get any custody, so she is very sad about not seeing her grandkids Mood status: uncontrolled Satisfied  with current treatment?: yes Symptom severity: moderate  Duration of current treatment : chronic Side effects: no Medication compliance: excellent compliance Psychotherapy/counseling: no  Previous psychiatric medications: zoloft Depressed mood: yes Anxious mood: yes Anhedonia: no Significant weight loss or gain: no Insomnia: no  Fatigue: yes Feelings of worthlessness or guilt: yes Impaired concentration/indecisiveness: no Suicidal ideations: no Hopelessness: no Crying spells: yes Depression screen [facility] 2/9 01/20/2018 10/12/2016 07/03/2016 02/27/2016 01/04/2015  Decreased Interest 1 0 0 - 0  Down, Depressed, Hopeless 0 0 0 1 0  PHQ - 2 Score 1 0 0 1 0  Altered sleeping 0 - - - -  Tired, decreased energy 0 - - - -  Change in appetite 0 - - - -  Feeling bad or failure about yourself  0 - - - -  Trouble concentrating 0 - - - -  Moving slowly or fidgety/restless 0 - - - -  Suicidal thoughts 0 - - - -  PHQ-9 Score 1 - - - -     Relevant past medical, surgical, family and social history reviewed and updated as indicated. Interim medical history since our last visit reviewed. Allergies and medications reviewed and updated.  Review of Systems  Constitutional: Negative.   HENT: Positive for ear pain. Negative for congestion, dental problem, drooling, ear discharge, facial swelling, hearing loss, mouth sores, nosebleeds, postnasal drip, rhinorrhea, sinus pressure, sinus pain, sneezing, sore throat, tinnitus, trouble swallowing and voice change.   Respiratory: Negative.   Cardiovascular: Negative.   Musculoskeletal: Positive for arthralgias, back pain and myalgias. Negative for gait problem, joint swelling, neck pain  and neck stiffness.  Allergic/Immunologic: Negative.   Neurological: Positive for tremors. Negative for dizziness, seizures, syncope, facial asymmetry, speech difficulty, weakness, light-headedness, numbness and headaches.  Hematological: Negative.   Psychiatric/Behavioral:  Positive for dysphoric mood. Negative for agitation, behavioral problems, confusion, decreased concentration, hallucinations, self-injury, sleep disturbance and suicidal ideas. The patient is nervous/anxious. The patient is not hyperactive.     Per HPI unless specifically indicated above     Objective:    BP (!) 157/81 (BP Location: Left Arm, Cuff Size: Normal)   Pulse 72   Temp 98.5 F (36.9 C)   Wt 143 lb 7 oz (65.1 kg)   SpO2 99%   BMI 25.98 kg/m   Wt Readings from Last 3 Encounters:  01/20/18 143 lb 7 oz (65.1 kg)  08/15/17 138 lb (62.6 kg)  07/15/17 137 lb (62.1 kg)    Physical Exam  Constitutional: She is oriented to person, place, and time. She appears well-developed and well-nourished. No distress.  HENT:  Head: Normocephalic and atraumatic.  Right Ear: Hearing, tympanic membrane, external ear and ear canal normal.  Left Ear: Hearing, tympanic membrane, external ear and ear canal normal.  Nose: Nose normal.  Mouth/Throat: Uvula is midline, oropharynx is clear and moist and mucous membranes are normal. No oropharyngeal exudate.  Eyes: Conjunctivae and lids are normal. Right eye exhibits no discharge. Left eye exhibits no discharge. No scleral icterus.  Cardiovascular: Normal rate, regular rhythm, normal heart sounds and intact distal pulses. Exam reveals no gallop and no friction rub.  No murmur heard. Pulmonary/Chest: Effort normal and breath sounds normal. No respiratory distress. She has no wheezes. She exhibits no tenderness.  Musculoskeletal: She exhibits edema, tenderness and deformity.  Neurological: She is alert and oriented to person, place, and time.  Skin: Skin is warm, dry and intact. No rash noted. She is not diaphoretic. No erythema. No pallor.  Psychiatric: She has a normal mood and affect. Her speech is normal and behavior is normal. Judgment and thought content normal. Cognition and memory are normal.    Results for orders placed or performed in visit on  08/15/17  Comprehensive metabolic panel  Result Value Ref Range   Glucose 79 65 - 99 mg/dL   BUN 13 8 - 27 mg/dL   Creatinine, Ser 0.87 0.57 - 1.00 mg/dL   GFR calc non Af Amer 67 >59 mL/min/1.73   GFR calc Af Amer 77 >59 mL/min/1.73   BUN/Creatinine Ratio 15 12 - 28   Sodium 133 (L) 134 - 144 mmol/L   Potassium 5.0 3.5 - 5.2 mmol/L   Chloride 90 (L) 96 - 106 mmol/L   CO2 29 20 - 29 mmol/L   Calcium 10.3 8.7 - 10.3 mg/dL   Total Protein 7.2 6.0 - 8.5 g/dL   Albumin 4.7 3.5 - 4.8 g/dL   Globulin, Total 2.5 1.5 - 4.5 g/dL   Albumin/Globulin Ratio 1.9 1.2 - 2.2   Bilirubin Total 0.5 0.0 - 1.2 mg/dL   Alkaline Phosphatase 72 39 - 117 IU/L   AST 23 0 - 40 IU/L   ALT 16 0 - 32 IU/L  CBC with Differential/Platelet  Result Value Ref Range   WBC 8.1 3.4 - 10.8 x10E3/uL   RBC 4.36 3.77 - 5.28 x10E6/uL   Hemoglobin 13.1 11.1 - 15.9 g/dL   Hematocrit 37.5 34.0 - 46.6 %   MCV 86 79 - 97 fL   MCH 30.0 26.6 - 33.0 pg   MCHC 34.9 31.5 - 35.7 g/dL  RDW 13.1 12.3 - 15.4 %   Platelets 288 150 - 379 x10E3/uL   Neutrophils 76 Not Estab. %   Lymphs 14 Not Estab. %   Monocytes 7 Not Estab. %   Eos 3 Not Estab. %   Basos 0 Not Estab. %   Neutrophils Absolute 6.1 1.4 - 7.0 x10E3/uL   Lymphocytes Absolute 1.1 0.7 - 3.1 x10E3/uL   Monocytes Absolute 0.6 0.1 - 0.9 x10E3/uL   EOS (ABSOLUTE) 0.3 0.0 - 0.4 x10E3/uL   Basophils Absolute 0.0 0.0 - 0.2 x10E3/uL   Immature Granulocytes 0 Not Estab. %   Immature Grans (Abs) 0.0 0.0 - 0.1 x10E3/uL  Thyroid Panel With TSH  Result Value Ref Range   TSH 1.440 0.450 - 4.500 uIU/mL   T4, Total 7.2 4.5 - 12.0 ug/dL   T3 Uptake Ratio 25 24 - 39 %   Free Thyroxine Index 1.8 1.2 - 4.9      Assessment & Plan:   Problem List Items Addressed This Visit      Cardiovascular and Mediastinum   Hypertension - Primary (Chronic)    Stable. Continue current regimen. Continue to monitor. Work on stress reduction. Call with any concerns.       Relevant  Medications   valsartan-hydrochlorothiazide (DIOVAN-HCT) 160-25 MG tablet   metoprolol succinate (TOPROL-XL) 25 MG 24 hr tablet   Other Relevant Orders   CBC with Differential/Platelet   Comprehensive metabolic panel   Microalbumin, Urine Waived   TSH     Digestive   GERD (gastroesophageal reflux disease)    Stable on current regimen. Continue to monitor. Call with any concerns.       Relevant Medications   pantoprazole (PROTONIX) 20 MG tablet   Other Relevant Orders   CBC with Differential/Platelet   Comprehensive metabolic panel   TSH     Musculoskeletal and Integument   Osteoporosis    Has had a treatment with reclast this year, but allergic to it. Discussed starting something else, she will consider vs referral to endocrine for evaluation. Call with any concerns.       Relevant Medications   calcium carbonate (CALCIUM 600) 600 MG TABS tablet   cholecalciferol (VITAMIN D) 1000 units tablet     Other   Hyperlipidemia (Chronic)    Rechecking levels today. Await results. Call with any concerns.       Relevant Medications   valsartan-hydrochlorothiazide (DIOVAN-HCT) 160-25 MG tablet   metoprolol succinate (TOPROL-XL) 25 MG 24 hr tablet   Other Relevant Orders   CBC with Differential/Platelet   Comprehensive metabolic panel   Lipid Panel w/o Chol/HDL Ratio   TSH   Generalized anxiety disorder    Not doing well. Taking the zoloft and stable but with a lot of family stress. Continue current regimen. Continue to monitor. Call with any concerns. Recheck 3-4 months.       Relevant Medications   traZODone (DESYREL) 50 MG tablet   sertraline (ZOLOFT) 100 MG tablet   Other Relevant Orders   CBC with Differential/Platelet   Comprehensive metabolic panel   TSH   Depression    Not doing well. Taking the zoloft and stable but with a lot of family stress. Continue current regimen. Continue to monitor. Call with any concerns. Recheck 3-4 months.       Relevant Medications    traZODone (DESYREL) 50 MG tablet   sertraline (ZOLOFT) 100 MG tablet   Other Relevant Orders   CBC with Differential/Platelet   Comprehensive metabolic panel  TSH   Female genuine stress incontinence    Checking UA today- await results.       Relevant Orders   CBC with Differential/Platelet   Comprehensive metabolic panel   TSH   UA/M w/rflx Culture, Routine   Elevated rheumatoid factor    She has been having pain in her joints. She is still able to function, but doing poorly. Not on the plaquenel. Will get her back into see rheumatology. Referral generated today. Call with any concerns.       Relevant Orders   Ambulatory referral to Rheumatology    Other Visit Diagnoses    Hypoglycemia       A1c checked today.    Relevant Orders   Bayer DCA Hb A1c Waived   Screening for colon cancer       Due for cologuard in April- order in.    Relevant Orders   Cologuard   Bilateral temporomandibular joint pain       Follow up with dentist. Rx for flexeril given today. Call with any concerns or if not getting better.        Follow up plan: Return 3-4 months, for follow up.

## 2018-01-20 NOTE — Assessment & Plan Note (Signed)
Checking UA today- await results.

## 2018-01-20 NOTE — Assessment & Plan Note (Signed)
Has had a treatment with reclast this year, but allergic to it. Discussed starting something else, she will consider vs referral to endocrine for evaluation. Call with any concerns.

## 2018-01-20 NOTE — Assessment & Plan Note (Addendum)
She has been having pain in her joints. She is still able to function, but doing poorly. Not on the plaquenel. Will get her back into see rheumatology. Referral generated today. Call with any concerns.

## 2018-01-20 NOTE — Assessment & Plan Note (Addendum)
Stable. Continue current regimen. Continue to monitor. Work on stress reduction. Call with any concerns.

## 2018-01-20 NOTE — Patient Instructions (Addendum)
Hypoglycemia  Hypoglycemia occurs when the level of sugar (glucose) in the blood is too low. Glucose is a type of sugar that provides the body's main source of energy. Certain hormones (insulin and glucagon) control the level of glucose in the blood. Insulin lowers blood glucose, and glucagon increases blood glucose. Hypoglycemia can result from having too much insulin in the bloodstream, or from not eating enough food that contains glucose.  Hypoglycemia can happen in people who do or do not have diabetes. It can develop quickly, and it can be a medical emergency.  What are the causes?  Hypoglycemia occurs most often in people who have diabetes. If you have diabetes, hypoglycemia may be caused by:   Diabetes medicine.   Not eating enough, or not eating often enough.   Increased physical activity.   Drinking alcohol, especially when you have not eaten recently.    If you do not have diabetes, hypoglycemia may be caused by:   A tumor in the pancreas. The pancreas is the organ that makes insulin.   Not eating enough, or not eating for long periods at a time (fasting).   Severe infection or illness that affects the liver, heart, or kidneys.   Certain medicines.    You may also have reactive hypoglycemia. This condition causes hypoglycemia within 4 hours of eating a meal. This may occur after having stomach surgery. Sometimes, the cause of reactive hypoglycemia is not known.  What increases the risk?  Hypoglycemia is more likely to develop in:   People who have diabetes and take medicines to lower blood glucose.   People who abuse alcohol.   People who have a severe illness.    What are the signs or symptoms?  Hypoglycemia may not cause any symptoms. If you have symptoms, they may include:   Hunger.   Anxiety.   Sweating and feeling clammy.   Confusion.   Dizziness or feeling light-headed.   Sleepiness.   Nausea.   Increased heart rate.   Headache.   Blurry  vision.   Seizure.   Nightmares.   Tingling or numbness around the mouth, lips, or tongue.   A change in speech.   Decreased ability to concentrate.   A change in coordination.   Restless sleep.   Tremors or shakes.   Fainting.   Irritability.    How is this diagnosed?  Hypoglycemia is diagnosed with a blood test to measure your blood glucose level. This blood test is done while you are having symptoms. Your health care provider may also do a physical exam and review your medical history.  If you do not have diabetes, other tests may be done to find the cause of your hypoglycemia.  How is this treated?  This condition can often be treated by immediately eating or drinking something that contains glucose, such as:   3-4 sugar tablets (glucose pills).   Glucose gel, 15-gram tube.   Fruit juice, 4 oz (120 mL).   Regular soda (not diet soda), 4 oz (120 mL).   Low-fat milk, 4 oz (120 mL).   Several pieces of hard candy.   Sugar or honey, 1 Tbsp.    Treating Hypoglycemia If You Have Diabetes    If you are alert and able to swallow safely, follow the 15:15 rule:   Take 15 grams of a rapid-acting carbohydrate. Rapid-acting options include:  ? 1 tube of glucose gel.  ? 3 glucose pills.  ? 6-8 pieces of hard candy.  ?   4 oz (120 mL) of fruit juice.  ? 4 oz (120 ml) of regular (not diet) soda.   Check your blood glucose 15 minutes after you take the carbohydrate.   If the repeat blood glucose level is still at or below 70 mg/dL (3.9 mmol/L), take 15 grams of a carbohydrate again.   If your blood glucose level does not increase above 70 mg/dL (3.9 mmol/L) after 3 tries, seek emergency medical care.   After your blood glucose level returns to normal, eat a meal or a snack within 1 hour.    Treating Severe Hypoglycemia  Severe hypoglycemia is when your blood glucose level is at or below 54 mg/dL (3 mmol/L). Severe hypoglycemia is an emergency. Do not wait to see if the symptoms will go away. Get medical help  right away. Call your local emergency services (911 in the [place] Do not drive yourself to the hospital.  If you have severe hypoglycemia and you cannot eat or drink, you may need an injection of glucagon. A family member or close friend should learn how to check your blood glucose and how to give you a glucagon injection. Ask your health care provider if you need to have an emergency glucagon injection kit available.  Severe hypoglycemia may need to be treated in a hospital. The treatment may include getting glucose through an IV tube. You may also need treatment for the cause of your hypoglycemia.  Follow these instructions at home:  General instructions   Avoid any diets that cause you to not eat enough food. Talk with your health care provider before you start any new diet.   Take over-the-counter and prescription medicines only as told by your health care provider.   Limit alcohol intake to no more than 1 drink per day for nonpregnant women and 2 drinks per day for men. One drink equals 12 oz of beer, 5 oz of wine, or 1 oz of hard liquor.   Keep all follow-up visits as told by your health care provider. This is important.  If You Have Diabetes:     Make sure you know the symptoms of hypoglycemia.   Always have a rapid-acting carbohydrate snack with you to treat low blood sugar.   Follow your diabetes management plan, as told by your health care provider. Make sure you:  ? Take your medicines as directed.  ? Follow your exercise plan.  ? Follow your meal plan. Eat on time, and do not skip meals.  ? Check your blood glucose as often as directed. Make sure to check your blood glucose before and after exercise. If you exercise longer or in a different way than usual, check your blood glucose more often.  ? Follow your sick day plan whenever you cannot eat or drink normally. Make this plan in advance with your health care provider.   Share your diabetes management plan with people in your workplace, school,  and household.   Check your urine for ketones when you are ill and as told by your health care provider.   Carry a medical alert card or wear medical alert jewelry.  If You Have Reactive Hypoglycemia or Low Blood Sugar From Other Causes:   Monitor your blood glucose as told by your health care provider.   Follow instructions from your health care provider about eating or drinking restrictions.  Contact a health care provider if:   You have problems keeping your blood glucose in your target range.   You have   frequent episodes of hypoglycemia.  Get help right away if:   You continue to have hypoglycemia symptoms after eating or drinking something containing glucose.   Your blood glucose is at or below 54 mg/dL (3 mmol/L).   You have a seizure.   You faint.  These symptoms may represent a serious problem that is an emergency. Do not wait to see if the symptoms will go away. Get medical help right away. Call your local emergency services (911 in the [place] Do not drive yourself to the hospital.  This information is not intended to replace advice given to you by your health care provider. Make sure you discuss any questions you have with your health care provider.  Document Released: 12/10/2005 Document Revised: 05/23/2016 Document Reviewed: 01/13/2016  Elsevier Interactive Patient Education  2018 [employer]

## 2018-01-20 NOTE — Assessment & Plan Note (Signed)
Stable on current regimen. Continue to monitor. Call with any concerns.  

## 2018-01-21 ENCOUNTER — Encounter: Payer: Self-pay | Admitting: Family Medicine

## 2018-01-21 LAB — COMPREHENSIVE METABOLIC PANEL
ALBUMIN: 4.3 g/dL (ref 3.5–4.8)
ALT: 16 IU/L (ref 0–32)
AST: 20 IU/L (ref 0–40)
Albumin/Globulin Ratio: 1.5 (ref 1.2–2.2)
Alkaline Phosphatase: 70 IU/L (ref 39–117)
BUN / CREAT RATIO: 21 (ref 12–28)
BUN: 17 mg/dL (ref 8–27)
Bilirubin Total: 0.3 mg/dL (ref 0.0–1.2)
CALCIUM: 10 mg/dL (ref 8.7–10.3)
CO2: 24 mmol/L (ref 20–29)
CREATININE: 0.8 mg/dL (ref 0.57–1.00)
Chloride: 92 mmol/L — ABNORMAL LOW (ref 96–106)
GFR calc Af Amer: 85 mL/min/{1.73_m2} (ref >59)
GFR, EST NON AFRICAN AMERICAN: 73 mL/min/{1.73_m2} (ref >59)
GLOBULIN, TOTAL: 2.9 g/dL (ref 1.5–4.5)
GLUCOSE: 102 mg/dL — AB (ref 65–99)
Potassium: 4.1 mmol/L (ref 3.5–5.2)
SODIUM: 132 mmol/L — AB (ref 134–144)
Total Protein: 7.2 g/dL (ref 6.0–8.5)

## 2018-01-21 LAB — CBC WITH DIFFERENTIAL/PLATELET
BASOS ABS: 0 10*3/uL (ref 0.0–0.2)
Basos: 1 %
EOS (ABSOLUTE): 0.6 10*3/uL — ABNORMAL HIGH (ref 0.0–0.4)
EOS: 8 %
HEMATOCRIT: 37.2 % (ref 34.0–46.6)
HEMOGLOBIN: 12.6 g/dL (ref 11.1–15.9)
IMMATURE GRANS (ABS): 0 10*3/uL (ref 0.0–0.1)
IMMATURE GRANULOCYTES: 0 %
LYMPHS: 18 %
Lymphocytes Absolute: 1.4 10*3/uL (ref 0.7–3.1)
MCH: 29.7 pg (ref 26.6–33.0)
MCHC: 33.9 g/dL (ref 31.5–35.7)
MCV: 88 fL (ref 79–97)
MONOCYTES: 8 %
Monocytes Absolute: 0.6 10*3/uL (ref 0.1–0.9)
NEUTROS PCT: 65 %
Neutrophils Absolute: 5.2 10*3/uL (ref 1.4–7.0)
Platelets: 302 10*3/uL (ref 150–379)
RBC: 4.24 x10E6/uL (ref 3.77–5.28)
RDW: 12.8 % (ref 12.3–15.4)
WBC: 7.9 10*3/uL (ref 3.4–10.8)

## 2018-01-21 LAB — LIPID PANEL W/O CHOL/HDL RATIO
CHOLESTEROL TOTAL: 232 mg/dL — AB (ref 100–199)
HDL: 102 mg/dL (ref >39)
LDL CALC: 102 mg/dL — AB (ref 0–99)
TRIGLYCERIDES: 142 mg/dL (ref 0–149)
VLDL CHOLESTEROL CAL: 28 mg/dL (ref 5–40)

## 2018-01-21 LAB — TSH: TSH: 1.53 u[IU]/mL (ref 0.450–4.500)

## 2018-01-22 LAB — UA/M W/RFLX CULTURE, ROUTINE
BILIRUBIN UA: NEGATIVE
GLUCOSE, UA: NEGATIVE
Ketones, UA: NEGATIVE
NITRITE UA: NEGATIVE
Protein, UA: NEGATIVE
SPEC GRAV UA: 1.01 (ref 1.005–1.030)
UUROB: 0.2 mg/dL (ref 0.2–1.0)
pH, UA: 5 (ref 5.0–7.5)

## 2018-01-22 LAB — MICROSCOPIC EXAMINATION: RBC MICROSCOPIC, UA: NONE SEEN /HPF (ref 0–?)

## 2018-01-22 LAB — MICROALBUMIN, URINE WAIVED
Creatinine, Urine Waived: 50 mg/dL (ref 10–300)
Microalb, Ur Waived: 10 mg/L (ref 0–19)

## 2018-01-22 LAB — URINE CULTURE, REFLEX

## 2018-01-22 LAB — BAYER DCA HB A1C WAIVED: HB A1C (BAYER DCA - WAIVED): 5.3 % (ref <7.0)

## 2018-01-27 ENCOUNTER — Telehealth: Payer: Self-pay | Admitting: Family Medicine

## 2018-01-27 NOTE — Telephone Encounter (Signed)
Copied from [place] [phone]. Topic: Quick Communication - See Telephone Encounter >> Jan 27, 2018  4:14 PM Boyd Kerbs wrote: CRM for notification. See Telephone encounter for:   Patient called and said she has RA and the top of left hand and  left side of face goes numb. Wanted doctor to know this.   01/27/18.

## 2018-01-27 NOTE — Telephone Encounter (Signed)
Michele Meyer phoned in with questions regarding the joint pain in her left hand. She is now experiencing a numbness on the top of her left hand for 24 hours, now. She also reports her left cheek has a numbness feeling that comes and goes since yesterday. I had her check B/P it was 122/100 HR was 80.  I did review stroke s/sx which she denied all. She stated she didn't think the B/P was accurate and that she would recheck later, as she was leaving the house. She asked if another ANA lab was done with the latest labs.I did not find an ANA since 03/02/17 which she commented she thought one would be done with these labs. Informed her [facility] will be calling her for an appointment in the near future as the referral has been placed.  Please advise on the numbness as to whether it could be related to her "Rheumatoid Arthritis".

## 2018-01-28 NOTE — Telephone Encounter (Signed)
Routing to provider  

## 2018-01-28 NOTE — Telephone Encounter (Signed)
[facility] called in, they need a copy of pt's previous rheumatology results and rheumatology notes.   Please fax to: [phone]

## 2018-01-28 NOTE — Telephone Encounter (Signed)
Patient will go to [facility] to sign a records release for previous notes.

## 2018-02-11 ENCOUNTER — Telehealth: Payer: Self-pay | Admitting: Family Medicine

## 2018-02-11 MED ORDER — ALPRAZOLAM 0.25 MG PO TABS: 0.2500 mg | ORAL_TABLET | Freq: Two times a day (BID) | ORAL | 0 refills | Status: DC | PRN

## 2018-02-11 NOTE — Telephone Encounter (Signed)
That's fine. Please call in her med and let me know if she needs anything.

## 2018-02-11 NOTE — Telephone Encounter (Signed)
Copied from [place]. Topic: Quick Communication - Rx Refill/Question >> Feb 11, 2018 12:22 PM Wynetta Emery, [place] C wrote: Medication: Xanax - pt called in to speak with providers assistant. She said that she have anxiety and is on her way out of town due to a family emergency. She would like to know if provider could send in a Rx for Xanax to her pharmacy?   Please advise - [phone]   Has the patient contacted their pharmacy? no     Preferred Pharmacy (with phone number or street name): [facility], [place]: Please be advised that RX refills may take up to 3 business days. We ask that you follow-up with your pharmacy.

## 2018-02-11 NOTE — Telephone Encounter (Signed)
Medication called in 

## 2018-03-04 ENCOUNTER — Other Ambulatory Visit: Payer: Self-pay | Admitting: Family Medicine

## 2018-03-04 NOTE — Telephone Encounter (Signed)
Copied from [place] [phone]. Topic: Quick Communication - Rx Refill/Question >> Mar 04, 2018 10:24 AM Aurelio Brash B wrote: Medication: ALPRAZolam Duanne Moron) 0.25 MG tablet   Has the patient contacted their pharmacy? No no refills   (Agent: If no, request that the patient contact the pharmacy for the refill.)   Preferred Pharmacy (with phone number or street name): [place], [place] - [place] [phone] (Phone) [phone] (Fax)     Agent: Please be advised that RX refills may take up to 3 business days. We ask that you follow-up with your pharmacy.

## 2018-03-04 NOTE — Telephone Encounter (Signed)
LOV: 01/20/18  Dr. De[facility]- [address]

## 2018-03-05 DIAGNOSIS — M170 Bilateral primary osteoarthritis of knee: Secondary | ICD-10-CM | POA: Diagnosis not present

## 2018-03-10 ENCOUNTER — Telehealth: Payer: Self-pay | Admitting: Family Medicine

## 2018-03-10 ENCOUNTER — Ambulatory Visit (INDEPENDENT_AMBULATORY_CARE_PROVIDER_SITE_OTHER): Payer: Medicare Other | Admitting: Family Medicine

## 2018-03-10 ENCOUNTER — Other Ambulatory Visit: Payer: Self-pay | Admitting: Family Medicine

## 2018-03-10 ENCOUNTER — Encounter: Payer: Self-pay | Admitting: Family Medicine

## 2018-03-10 VITALS — BP 147/78 | HR 69 | Temp 97.9°F | Wt 142.0 lb

## 2018-03-10 DIAGNOSIS — F411 Generalized anxiety disorder: Secondary | ICD-10-CM | POA: Diagnosis not present

## 2018-03-10 DIAGNOSIS — F3341 Major depressive disorder, recurrent, in partial remission: Secondary | ICD-10-CM

## 2018-03-10 DIAGNOSIS — R768 Other specified abnormal immunological findings in serum: Secondary | ICD-10-CM

## 2018-03-10 DIAGNOSIS — H60331 Swimmer's ear, right ear: Secondary | ICD-10-CM | POA: Diagnosis not present

## 2018-03-10 MED ORDER — DICLOFENAC SODIUM 1 % TD GEL: 4.0000 g | Freq: Four times a day (QID) | TRANSDERMAL | 4 refills | Status: DC

## 2018-03-10 MED ORDER — SERTRALINE HCL 50 MG PO TABS: 75.0000 mg | ORAL_TABLET | Freq: Every day | ORAL | 3 refills | Status: DC

## 2018-03-10 MED ORDER — CIPROFLOXACIN-DEXAMETHASONE 0.3-0.1 % OT SUSP: 4.0000 [drp] | Freq: Two times a day (BID) | OTIC | 0 refills | Status: DC

## 2018-03-10 MED ORDER — ALPRAZOLAM 0.25 MG PO TABS: 0.2500 mg | ORAL_TABLET | Freq: Two times a day (BID) | ORAL | 0 refills | Status: DC | PRN

## 2018-03-10 NOTE — Assessment & Plan Note (Signed)
Had to cancel her rheumatology appointment. To be seeing them later this month. Will give her voltaren to help until her appointment with them. She will call to check on when appointment is.

## 2018-03-10 NOTE — Assessment & Plan Note (Signed)
Not doing well. Very sedated on 100mg  zoloft. Will increase to 75mg  and recheck 1 month. Refill of xanax given today, with goal of occasional use. Rehcheck 1 month. Call with any concerns.

## 2018-03-10 NOTE — Telephone Encounter (Signed)
Copied from [place]. Topic: Quick Communication - See Telephone Encounter >> Mar 10, 2018  3:32 PM Bea Graff, NT wrote: CRM for notification. See Telephone encounter for: Pt states that [facility] did not receive the rx for ALPRAZolam Duanne Moron).   03/10/18.

## 2018-03-10 NOTE — Telephone Encounter (Signed)
Call to patient- she states she found the hand written Rx.

## 2018-03-10 NOTE — Progress Notes (Signed)
BP (!) 147/78   Pulse 69   Temp 97.9 F (36.6 C) (Oral)   Wt 142 lb (64.4 kg)   SpO2 99%   BMI 25.72 kg/m    Subjective:    Patient ID: Michele Meyer, female    DOB: 1946-04-29, 72 y.o.   MRN: [id number]  HPI: Michele Meyer is a 72 y.o. female  Chief Complaint  Patient presents with  . Anxiety  . Ear Pain    Right ear. X 4 days. Worsening. Outside of ear.    ANXIETY/STRESS- has been having a lot of issues with her son and his divorce. Her son's fiance is pregnant and his step-son has been in the hospital with seizures. He has CP and has been having issues. Things are taking a lot longer than she wants and has been feeling worse. Having a lot of issues. Has not been coping well. Has not been sleeping well. She notes that the trazodone has been making her groggy in the AM. She is possibly going to [place] to see if she can help out. Has been having a lot of stress.  Duration:exacerbated Anxious mood: yes  Excessive worrying: yes Irritability: no  Sweating: no Nausea: no Palpitations:yes Hyperventilation: no Panic attacks: no Agoraphobia: no  Obscessions/compulsions: no Depressed mood: yes Depression screen [facility] 2/9 01/20/2018 10/12/2016 07/03/2016 02/27/2016 01/04/2015  Decreased Interest 1 0 0 - 0  Down, Depressed, Hopeless 0 0 0 1 0  PHQ - 2 Score 1 0 0 1 0  Altered sleeping 0 - - - -  Tired, decreased energy 0 - - - -  Change in appetite 0 - - - -  Feeling bad or failure about yourself  0 - - - -  Trouble concentrating 0 - - - -  Moving slowly or fidgety/restless 0 - - - -  Suicidal thoughts 0 - - - -  PHQ-9 Score 1 - - - -   Anhedonia: no Weight changes: no Insomnia: yes hard to fall asleep  Hypersomnia: no Fatigue/loss of energy: yes Feelings of worthlessness: yes Feelings of guilt: yes Impaired concentration/indecisiveness: yes Suicidal ideations: no  Crying spells: yes Recent Stressors/Life Changes: yes   Relationship problems: no   Family stress: yes      Financial stress: yes    Job stress: no    Recent death/loss: no  Joints have been much worse. Had to cancel her rheumatology appointment due to home issues.   EAR PAIN Duration: 4 days Involved ear(s): right Severity:  severe  Quality:  aching Fever: no Otorrhea: no Upper respiratory infection symptoms: no Pruritus: no Hearing loss: yes Water immersion no Using Q-tips: no Recurrent otitis media: no Status: stable Treatments attempted: none  Relevant past medical, surgical, family and social history reviewed and updated as indicated. Interim medical history since our last visit reviewed. Allergies and medications reviewed and updated.  Review of Systems  Constitutional: Negative.   HENT: Positive for congestion, ear pain and rhinorrhea. Negative for dental problem, drooling, ear discharge, facial swelling, hearing loss, mouth sores, nosebleeds, postnasal drip, sinus pressure, sinus pain, sneezing, sore throat, tinnitus, trouble swallowing and voice change.   Respiratory: Negative.   Cardiovascular: Negative.   Musculoskeletal: Positive for arthralgias and joint swelling. Negative for back pain, gait problem, myalgias, neck pain and neck stiffness.  Skin: Negative.   Psychiatric/Behavioral: Positive for dysphoric mood and sleep disturbance. Negative for agitation, behavioral problems, confusion, decreased concentration, hallucinations, self-injury and suicidal ideas. The patient is nervous/anxious.  The patient is not hyperactive.     Per HPI unless specifically indicated above     Objective:    BP (!) 147/78   Pulse 69   Temp 97.9 F (36.6 C) (Oral)   Wt 142 lb (64.4 kg)   SpO2 99%   BMI 25.72 kg/m   Wt Readings from Last 3 Encounters:  03/10/18 142 lb (64.4 kg)  01/20/18 143 lb 7 oz (65.1 kg)  08/15/17 138 lb (62.6 kg)    Physical Exam  Constitutional: She is oriented to person, place, and time. She appears well-developed and well-nourished. No distress.    HENT:  Head: Normocephalic and atraumatic.  Right Ear: Hearing, tympanic membrane and external ear normal.  Left Ear: Hearing, tympanic membrane, external ear and ear canal normal.  Nose: Nose normal.  Mouth/Throat: Oropharynx is clear and moist. No oropharyngeal exudate.  Swelling and irritation in R EAC   Eyes: Conjunctivae, EOM and lids are normal. Pupils are equal, round, and reactive to light. Right eye exhibits no discharge. Left eye exhibits no discharge. No scleral icterus.  Neck: Normal range of motion. Neck supple. No JVD present. No tracheal deviation present. No thyromegaly present.  Cardiovascular: Normal rate, regular rhythm, normal heart sounds and intact distal pulses. Exam reveals no gallop and no friction rub.  No murmur heard. Pulmonary/Chest: Effort normal and breath sounds normal. No stridor. No respiratory distress. She has no wheezes. She has no rales. She exhibits no tenderness.  Musculoskeletal: Normal range of motion. She exhibits tenderness and deformity.  Lymphadenopathy:    She has no cervical adenopathy.  Neurological: She is alert and oriented to person, place, and time.  Skin: Skin is warm, dry and intact. No rash noted. She is not diaphoretic. No erythema. No pallor.  Psychiatric: She has a normal mood and affect. Her speech is normal and behavior is normal. Judgment and thought content normal. Cognition and memory are normal.  Nursing note and vitals reviewed.   Results for orders placed or performed in visit on 01/20/18  Microscopic Examination  Result Value Ref Range   WBC, UA 0-5 0 - 5 /hpf   RBC, UA None seen 0 - 2 /hpf   Epithelial Cells (non renal) 0-10 0 - 10 /hpf   Bacteria, UA Few None seen/Few  Urine Culture, Reflex  Result Value Ref Range   Urine Culture, Routine Final report    Organism ID, Bacteria Comment   CBC with Differential/Platelet  Result Value Ref Range   WBC 7.9 3.4 - 10.8 x10E3/uL   RBC 4.24 3.77 - 5.28 x10E6/uL    Hemoglobin 12.6 11.1 - 15.9 g/dL   Hematocrit 37.2 34.0 - 46.6 %   MCV 88 79 - 97 fL   MCH 29.7 26.6 - 33.0 pg   MCHC 33.9 31.5 - 35.7 g/dL   RDW 12.8 12.3 - 15.4 %   Platelets 302 150 - 379 x10E3/uL   Neutrophils 65 Not Estab. %   Lymphs 18 Not Estab. %   Monocytes 8 Not Estab. %   Eos 8 Not Estab. %   Basos 1 Not Estab. %   Neutrophils Absolute 5.2 1.4 - 7.0 x10E3/uL   Lymphocytes Absolute 1.4 0.7 - 3.1 x10E3/uL   Monocytes Absolute 0.6 0.1 - 0.9 x10E3/uL   EOS (ABSOLUTE) 0.6 (H) 0.0 - 0.4 x10E3/uL   Basophils Absolute 0.0 0.0 - 0.2 x10E3/uL   Immature Granulocytes 0 Not Estab. %   Immature Grans (Abs) 0.0 0.0 - 0.1 x10E3/uL  Comprehensive metabolic panel  Result Value Ref Range   Glucose 102 (H) 65 - 99 mg/dL   BUN 17 8 - 27 mg/dL   Creatinine, Ser 0.80 0.57 - 1.00 mg/dL   GFR calc non Af Amer 73 >59 mL/min/1.73   GFR calc Af Amer 85 >59 mL/min/1.73   BUN/Creatinine Ratio 21 12 - 28   Sodium 132 (L) 134 - 144 mmol/L   Potassium 4.1 3.5 - 5.2 mmol/L   Chloride 92 (L) 96 - 106 mmol/L   CO2 24 20 - 29 mmol/L   Calcium 10.0 8.7 - 10.3 mg/dL   Total Protein 7.2 6.0 - 8.5 g/dL   Albumin 4.3 3.5 - 4.8 g/dL   Globulin, Total 2.9 1.5 - 4.5 g/dL   Albumin/Globulin Ratio 1.5 1.2 - 2.2   Bilirubin Total 0.3 0.0 - 1.2 mg/dL   Alkaline Phosphatase 70 39 - 117 IU/L   AST 20 0 - 40 IU/L   ALT 16 0 - 32 IU/L  Lipid Panel w/o Chol/HDL Ratio  Result Value Ref Range   Cholesterol, Total 232 (H) 100 - 199 mg/dL   Triglycerides 142 0 - 149 mg/dL   HDL 102 >39 mg/dL   VLDL Cholesterol Cal 28 5 - 40 mg/dL   LDL Calculated 102 (H) 0 - 99 mg/dL  Microalbumin, Urine Waived  Result Value Ref Range   Microalb, Ur Waived 10 0 - 19 mg/L   Creatinine, Urine Waived 50 10 - 300 mg/dL   Microalb/Creat Ratio 30-300 (H) <30 mg/g  TSH  Result Value Ref Range   TSH 1.530 0.450 - 4.500 uIU/mL  UA/M w/rflx Culture, Routine  Result Value Ref Range   Specific Gravity, UA 1.010 1.005 - 1.030   pH,  UA 5.0 5.0 - 7.5   Color, UA Yellow Yellow   Appearance Ur Hazy (A) Clear   Leukocytes, UA 1+ (A) Negative   Protein, UA Negative Negative/Trace   Glucose, UA Negative Negative   Ketones, UA Negative Negative   RBC, UA Trace (A) Negative   Bilirubin, UA Negative Negative   Urobilinogen, Ur 0.2 0.2 - 1.0 mg/dL   Nitrite, UA Negative Negative   Microscopic Examination See below:    Urinalysis Reflex Comment   Bayer DCA Hb A1c Waived  Result Value Ref Range   Bayer DCA Hb A1c Waived 5.3 <7.0 %      Assessment & Plan:   Problem List Items Addressed This Visit      Other   Generalized anxiety disorder - Primary    Not doing well. Very sedated on 100mg  zoloft. Will increase to 75mg  and recheck 1 month. Refill of xanax given today, with goal of occasional use. Rehcheck 1 month. Call with any concerns.       Relevant Medications   sertraline (ZOLOFT) 50 MG tablet   ALPRAZolam (XANAX) 0.25 MG tablet   Depression    Not doing well. Very sedated on 100mg  zoloft. Will increase to 75mg  and recheck 1 month. Refill of xanax given today, with goal of occasional use. Rehcheck 1 month. Call with any concerns.       Relevant Medications   sertraline (ZOLOFT) 50 MG tablet   ALPRAZolam (XANAX) 0.25 MG tablet   Elevated rheumatoid factor    Had to cancel her rheumatology appointment. To be seeing them later this month. Will give her voltaren to help until her appointment with them. She will call to check on when appointment is.  Other Visit Diagnoses    Acute swimmer's ear of right side       Will treat with ciprodex. Call with any concerns or if not getting better.        Follow up plan: Return in about 4 weeks (around 04/07/2018) for follow up mood.

## 2018-03-19 DIAGNOSIS — M1712 Unilateral primary osteoarthritis, left knee: Secondary | ICD-10-CM | POA: Diagnosis not present

## 2018-03-26 DIAGNOSIS — M1712 Unilateral primary osteoarthritis, left knee: Secondary | ICD-10-CM | POA: Diagnosis not present

## 2018-03-27 DIAGNOSIS — M180 Bilateral primary osteoarthritis of first carpometacarpal joints: Secondary | ICD-10-CM | POA: Diagnosis not present

## 2018-03-27 DIAGNOSIS — R768 Other specified abnormal immunological findings in serum: Secondary | ICD-10-CM | POA: Diagnosis not present

## 2018-03-27 DIAGNOSIS — M25542 Pain in joints of left hand: Secondary | ICD-10-CM | POA: Diagnosis not present

## 2018-03-27 DIAGNOSIS — M150 Primary generalized (osteo)arthritis: Secondary | ICD-10-CM | POA: Diagnosis not present

## 2018-04-04 DIAGNOSIS — M1712 Unilateral primary osteoarthritis, left knee: Secondary | ICD-10-CM | POA: Diagnosis not present

## 2018-04-07 ENCOUNTER — Ambulatory Visit (INDEPENDENT_AMBULATORY_CARE_PROVIDER_SITE_OTHER): Payer: Medicare Other

## 2018-04-07 VITALS — BP 138/79 | HR 69 | Temp 97.6°F | Resp 17 | Ht 62.0 in | Wt 144.3 lb

## 2018-04-07 DIAGNOSIS — Z1231 Encounter for screening mammogram for malignant neoplasm of breast: Secondary | ICD-10-CM

## 2018-04-07 DIAGNOSIS — Z1239 Encounter for other screening for malignant neoplasm of breast: Secondary | ICD-10-CM

## 2018-04-07 DIAGNOSIS — Z1211 Encounter for screening for malignant neoplasm of colon: Secondary | ICD-10-CM | POA: Diagnosis not present

## 2018-04-07 DIAGNOSIS — Z0000 Encounter for general adult medical examination without abnormal findings: Secondary | ICD-10-CM

## 2018-04-07 NOTE — Progress Notes (Signed)
Subjective:   Michele Meyer is a 72 y.o. female who presents for an Initial Medicare Annual Wellness Visit.  Review of Systems       Cardiac Risk Factors include: hypertension;advanced age (>15men, >78 women);dyslipidemia     Objective:    Today's Vitals   04/07/18 1108 04/07/18 1115 04/07/18 1151  BP: (!) 156/80  138/79  Pulse: 76  69  Resp: 17    Temp: 97.6 F (36.4 C)    TempSrc: Temporal    Weight: 144 lb 4.8 oz (65.5 kg)    Height: 5\' 2"  (1.575 m)    PainSc:  0-No pain    Body mass index is 26.39 kg/m.  Advanced Directives 04/07/2018 10/12/2016 10/26/2015 10/03/2015 09/28/2015  Does Patient Have a Medical Advance Directive? Yes No Yes No No  Type of Paramedic of [place];Living will - Living will - -  Does patient want to make changes to medical advance directive? - - No - Patient declined - -  Copy of [place] in Chart? No - copy requested - - - -  Would patient like information on creating a medical advance directive? - Yes - Educational materials given - No - patient declined information -  Some encounter information is confidential and restricted. Go to Review Flowsheets activity to see all data.    Current Medications (verified) Outpatient Encounter Medications as of 04/07/2018  Medication Sig  . ALPRAZolam (XANAX) 0.25 MG tablet Take 1 tablet (0.25 mg total) by mouth 2 (two) times daily as needed for anxiety.  . calcium carbonate (CALCIUM 600) 600 MG TABS tablet Take 1,200 mg by mouth 2 (two) times daily with a meal.  . cholecalciferol (VITAMIN D) 1000 units tablet Take 1,000 Units by mouth daily.  . ciprofloxacin-dexamethasone (CIPRODEX) OTIC suspension Place 4 drops into the right ear 2 (two) times daily.  . diclofenac sodium (VOLTAREN) 1 % GEL Apply 4 g topically 4 (four) times daily.  Marland Kitchen latanoprost (XALATAN) 0.005 % ophthalmic solution Place 1 drop into both eyes at bedtime.   . metoprolol succinate (TOPROL-XL)  25 MG 24 hr tablet Take 0.5 tablets (12.5 mg total) by mouth daily.  Marland Kitchen nystatin (MYCOSTATIN/NYSTOP) powder APPLY TO AFFECTED AREA 3 TIMES DAILY  . pantoprazole (PROTONIX) 20 MG tablet Take 2 tablets (40 mg total) by mouth daily.  . sertraline (ZOLOFT) 50 MG tablet Take 1.5 tablets (75 mg total) by mouth daily.  . traZODone (DESYREL) 50 MG tablet Take 0.5 tablets (25 mg total) by mouth at bedtime.  . valsartan-hydrochlorothiazide (DIOVAN-HCT) 160-25 MG tablet Take 1 tablet by mouth daily.  . [DISCONTINUED] cyclobenzaprine (FLEXERIL) 10 MG tablet Take 1 tablet (10 mg total) by mouth at bedtime. (Patient not taking: Reported on 04/07/2018)   No facility-administered encounter medications on file as of 04/07/2018.     Allergies (verified) Ace inhibitors; Codeine; Erythromycin; Fosamax [alendronate]; Hydroxychloroquine; Prednisone; Reclast [zoledronic acid]; Latex; and Penicillins   History: Past Medical History:  Diagnosis Date  . Anxiety   . Arthritis   . Bladder filling defect    bladder sling protrusing last 3 years   . Chronic cystitis   . Cyst of right kidney   . Cystocele   . Depression   . Diverticulosis   . Dyslipidemia   . Endometriosis   . Gestational diabetes mellitus 30 years ago   with pregnancy   . Glaucoma    both eyes  . Incomplete bladder emptying   . Labile hypertension   .  PONV (postoperative nausea and vomiting)   . Skin cancer   . Skin cancer    basal and squamous cell  . Stress incontinence   . Uterovaginal prolapse, incomplete   . UTI (lower urinary tract infection)    Past Surgical History:  Procedure Laterality Date  . ABDOMINAL HYSTERECTOMY     complete  . ANTERIOR AND POSTERIOR VAGINAL REPAIR    . APPENDECTOMY    . BREAST BIOPSY    . BREAST LUMPECTOMY     benign  . prolapsed bladder     Repair Dr.Cope  . PUBOVAGINAL SLING  4 years ago   protrusion of bladder sling for last 3 years  . removal of first rib     bilaterally  . TONSILLECTOMY      . TOTAL KNEE ARTHROPLASTY Right 10/03/2015   Procedure: RIGHT TOTAL KNEE ARTHROPLASTY;  Surgeon: Gaynelle Arabian, MD;  Location: [facility];  Service: Orthopedics;  Laterality: Right;  . VEIN LIGATION AND STRIPPING Bilateral    Family History  Problem Relation Age of Onset  . Diabetes Mother   . Hypertension Mother   . Cervical cancer Mother   . Skin cancer Mother   . Hypercholesterolemia Mother   . Heart disease Mother   . Kidney disease Mother   . Depression Father   . Hypertension Father   . Hypercholesterolemia Father   . Colon cancer Neg Hx    Social History   Socioeconomic History  . Marital status: Married    Spouse name: Not on file  . Number of children: 5  . Years of education: Not on file  . Highest education level: Not on file  Occupational History    Employer: retired  Scientific laboratory technician  . Financial resource strain: Not very hard  . Food insecurity:    Worry: Never true    Inability: Never true  . Transportation needs:    Medical: No    Non-medical: No  Tobacco Use  . Smoking status: Former Smoker    Last attempt to quit: 02/25/1964    Years since quitting: 54.1  . Smokeless tobacco: Never Used  . Tobacco comment: smoked for two months socially   Substance and Sexual Activity  . Alcohol use: Never    Frequency: Never  . Drug use: No  . Sexual activity: Not Currently  Lifestyle  . Physical activity:    Days per week: 0 days    Minutes per session: 0 min  . Stress: Not at all  Relationships  . Social connections:    Talks on phone: More than three times a week    Gets together: More than three times a week    Attends religious service: More than 4 times per year    Active member of club or organization: Yes    Attends meetings of clubs or organizations: More than 4 times per year    Relationship status: Married  Other Topics Concern  . Not on file  Social History Narrative   Married, mother of 46, with at least one granddaughter who is present today.    Daily Caffeine Use:  2 cups in am;   She does not exercise routinely. Former smoker who quit in 1965.   Takes occasional alcohol beverage.   As the name is [place] granddaughter's name is Coralynn Gaona    Tobacco Counseling Counseling given: Not Answered Comment: smoked for two months socially    Clinical Intake:  Pre-visit preparation completed: Yes  Pain : No/denies pain  Pain Score: 0-No pain     Nutritional Status: BMI 25 -29 Overweight Nutritional Risks: None Diabetes: No  How often do you need to have someone help you when you read instructions, pamphlets, or other written materials from your doctor or pharmacy?: 1 - Never What is the last grade level you completed in school?: college 3 years   Interpreter Needed?: No  Information entered by :: Kearra Calkin,LPN    Activities of Daily Living In your present state of health, do you have any difficulty performing the following activities: 04/07/2018 01/20/2018  Hearing? Tempie Donning  Comment discussed hearing clinics  -  Vision? N N  Difficulty concentrating or making decisions? N N  Walking or climbing stairs? Y N  Comment takes stairs slowly  -  Dressing or bathing? N N  Doing errands, shopping? N N  Preparing Food and eating ? N -  Using the Toilet? N -  In the past six months, have you accidently leaked urine? N -  Do you have problems with loss of bowel control? N -  Managing your Medications? N -  Managing your Finances? N -  Housekeeping or managing your Housekeeping? N -  Some recent data might be hidden     Immunizations and Health Maintenance Immunization History  Administered Date(s) Administered  . Influenza Split 11/04/2012  . Influenza, High Dose Seasonal PF 10/12/2016  . Influenza,inj,Quad PF,6+ Mos 09/09/2013, 11/24/2014  . Pneumococcal Conjugate-13 07/03/2016  . Pneumococcal Polysaccharide-23 11/04/2012  . Tdap 05/24/2010   There are no preventive care reminders to display for this  patient.  Patient Care Team: Valerie Roys, DO as PCP - General (Family Medicine) Pyrtle, Lajuan Lines, MD as Consulting Physician (Gastroenterology) Tat, Eustace Quail, DO as Consulting Physician (Neurology) Emmaline Kluver., MD (Rheumatology) Gaynelle Arabian, MD as Consulting Physician (Orthopedic Surgery)  Indicate any recent Medical Services you may have received from other than [facility] providers in the past year (date may be approximate).     Assessment:   This is a routine wellness examination for Katiya.  Hearing/Vision screen Vision Screening Comments: Goes to Dr.King twice a year   Dietary issues and exercise activities discussed: Current Exercise Habits: Structured exercise class, Time (Minutes): 55, Frequency (Times/Week): 1, Weekly Exercise (Minutes/Week): 55, Intensity: Mild, Exercise limited by: None identified  Goals    . DIET - INCREASE WATER INTAKE     Recommend drinking at least 6-8 glasses of water a day       Depression Screen PHQ 2/9 Scores 04/07/2018 01/20/2018 10/12/2016 07/03/2016 02/27/2016 01/04/2015 01/29/2013  PHQ - 2 Score 0 1 0 0 1 0 0  PHQ- 9 Score - 1 - - - - -    Fall Risk Fall Risk  04/07/2018 03/10/2018 01/20/2018 01/20/2018 10/12/2016  Falls in the past year? No No No No No    Is the patient's home free of loose throw rugs in walkways, pet beds, electrical cords, etc?  yes      Grab bars in the bathroom? yes      Handrails on the stairs?   yes      Adequate lighting?   yes  Timed Get Up and Go Performed Completed in 8 seconds with no use of assistive devices, steady gait. No intervention needed at this time.   Cognitive Function: MMSE - Mini Mental State Exam 08/15/2017  Orientation to time 4  Orientation to Place 5  Registration 3  Attention/ Calculation 5  Recall 3  Language-  name 2 objects 2  Language- repeat 1  Language- follow 3 step command 3  Language- read & follow direction 1  Write a sentence 1  Copy design 1  Total score 29      6CIT Screen 04/07/2018 10/12/2016  What Year? 0 points 0 points  What month? 0 points 0 points  What time? 0 points 0 points  Count back from 20 0 points 0 points  Months in reverse 0 points 0 points  Repeat phrase 2 points 4 points  Total Score 2 4    Screening Tests Health Maintenance  Topic Date Due  . COLONOSCOPY  09/16/2035 (Originally 10/02/2005)  . INFLUENZA VACCINE  07/24/2018  . MAMMOGRAM  02/06/2019  . TETANUS/TDAP  05/24/2020  . DEXA SCAN  Completed  . PNA vac Low Risk Adult  Completed  . Hepatitis C Screening  Addressed    Qualifies for Shingles Vaccine? Yes, discussed shingrix vaccine   Cancer Screenings: Lung: Low Dose CT Chest recommended if Age 90-80 years, 30 pack-year currently smoking OR have quit w/in 15years. Patient does not qualify. Breast: Up to date on Mammogram? Yes  Completed 02/06/2017 Up to date of Bone Density/Dexa? Yes completed 02/06/2017 Colorectal: cologuard ordered  Additional Screenings:  Hepatitis C Screening: completed 03/14/2016     Plan:    I have personally reviewed and addressed the Medicare Annual Wellness questionnaire and have noted the following in the patient's chart:  A. Medical and social history B. Use of alcohol, tobacco or illicit drugs  C. Current medications and supplements D. Functional ability and status E.  Nutritional status F.  Physical activity G. Advance directives H. List of other physicians I.  Hospitalizations, surgeries, and ER visits in previous 12 months J.  [place] such as hearing and vision if needed, cognitive and depression L. Referrals and appointments   In addition, I have reviewed and discussed with patient certain preventive protocols, quality metrics, and best practice recommendations. A written personalized care plan for preventive services as well as general preventive health recommendations were provided to patient.   Signed,  Tyler Aas, LPN Nurse Health  Advisor   Nurse Notes:none

## 2018-04-07 NOTE — Patient Instructions (Addendum)
Ms. Michele Meyer , Thank you for taking time to come for your Medicare Wellness Visit. I appreciate your ongoing commitment to your health goals. Please review the following plan we discussed and let me know if I can assist you in the future.   Screening recommendations/referrals: Colonoscopy: cologuard ordered Mammogram: completed 02/06/2017, Please call [phone] to schedule your mammogram.   Bone Density: completed 02/06/2017 Recommended yearly ophthalmology/optometry visit for glaucoma screening and checkup Recommended yearly dental visit for hygiene and checkup  Vaccinations: Influenza vaccine:due 08/2018 Pneumococcal vaccine: up to date Tdap vaccine: up to date Shingles vaccine: eligible, check with your insurance company for coverage   Advanced directives: Please bring a copy of your health care power of attorney and living will to the office at your convenience.  Conditions/risks identified: Recommend drinking at least 6-8 glasses of water a day   Next appointment: Follow up on 04/09/2018 at 10:30am with Dr.Johnson. Follow up in one year for your annual wellness exam.    Preventive Care 65 Years and Older, Female Preventive care refers to lifestyle choices and visits with your health care provider that can promote health and wellness. What does preventive care include?  A yearly physical exam. This is also called an annual well check.  Dental exams once or twice a year.  Routine eye exams. Ask your health care provider how often you should have your eyes checked.  Personal lifestyle choices, including:  Daily care of your teeth and gums.  Regular physical activity.  Eating a healthy diet.  Avoiding tobacco and drug use.  Limiting alcohol use.  Practicing safe sex.  Taking low-dose aspirin every day.  Taking vitamin and mineral supplements as recommended by your health care provider. What happens during an annual well check? The services and screenings done by your  health care provider during your annual well check will depend on your age, overall health, lifestyle risk factors, and family history of disease. Counseling  Your health care provider may ask you questions about your:  Alcohol use.  Tobacco use.  Drug use.  Emotional well-being.  Home and relationship well-being.  Sexual activity.  Eating habits.  History of falls.  Memory and ability to understand (cognition).  Work and work Statistician.  Reproductive health. Screening  You may have the following tests or measurements:  Height, weight, and BMI.  Blood pressure.  Lipid and cholesterol levels. These may be checked every 5 years, or more frequently if you are over 4 years old.  Skin check.  Lung cancer screening. You may have this screening every year starting at age 67 if you have a 30-pack-year history of smoking and currently smoke or have quit within the past 15 years.  Fecal occult blood test (FOBT) of the stool. You may have this test every year starting at age 89.  Flexible sigmoidoscopy or colonoscopy. You may have a sigmoidoscopy every 5 years or a colonoscopy every 10 years starting at age 74.  Hepatitis C blood test.  Hepatitis B blood test.  Sexually transmitted disease (STD) testing.  Diabetes screening. This is done by checking your blood sugar (glucose) after you have not eaten for a while (fasting). You may have this done every 1-3 years.  Bone density scan. This is done to screen for osteoporosis. You may have this done starting at age 78.  Mammogram. This may be done every 1-2 years. Talk to your health care provider about how often you should have regular mammograms. Talk with your health care provider  about your test results, treatment options, and if necessary, the need for more tests. Vaccines  Your health care provider may recommend certain vaccines, such as:  Influenza vaccine. This is recommended every year.  Tetanus, diphtheria, and  acellular pertussis (Tdap, Td) vaccine. You may need a Td booster every 10 years.  Zoster vaccine. You may need this after age 33.  Pneumococcal 13-valent conjugate (PCV13) vaccine. One dose is recommended after age 45.  Pneumococcal polysaccharide (PPSV23) vaccine. One dose is recommended after age 27. Talk to your health care provider about which screenings and vaccines you need and how often you need them. This information is not intended to replace advice given to you by your health care provider. Make sure you discuss any questions you have with your health care provider. Document Released: 01/06/2016 Document Revised: 08/29/2016 Document Reviewed: 10/11/2015 Elsevier Interactive Patient Education  2017 [place] Prevention in the Home Falls can cause injuries. They can happen to people of all ages. There are many things you can do to make your home safe and to help prevent falls. What can I do on the outside of my home?  Regularly fix the edges of walkways and driveways and fix any cracks.  Remove anything that might make you trip as you walk through a door, such as a raised step or threshold.  Trim any bushes or trees on the path to your home.  Use bright outdoor lighting.  Clear any walking paths of anything that might make someone trip, such as rocks or tools.  Regularly check to see if handrails are loose or broken. Make sure that both sides of any steps have handrails.  Any raised decks and porches should have guardrails on the edges.  Have any leaves, snow, or ice cleared regularly.  Use sand or salt on walking paths during winter.  Clean up any spills in your garage right away. This includes oil or grease spills. What can I do in the bathroom?  Use night lights.  Install grab bars by the toilet and in the tub and shower. Do not use towel bars as grab bars.  Use non-skid mats or decals in the tub or shower.  If you need to sit down in the shower, use  a plastic, non-slip stool.  Keep the floor dry. Clean up any water that spills on the floor as soon as it happens.  Remove soap buildup in the tub or shower regularly.  Attach bath mats securely with double-sided non-slip rug tape.  Do not have throw rugs and other things on the floor that can make you trip. What can I do in the bedroom?  Use night lights.  Make sure that you have a light by your bed that is easy to reach.  Do not use any sheets or blankets that are too big for your bed. They should not hang down onto the floor.  Have a firm chair that has side arms. You can use this for support while you get dressed.  Do not have throw rugs and other things on the floor that can make you trip. What can I do in the kitchen?  Clean up any spills right away.  Avoid walking on wet floors.  Keep items that you use a lot in easy-to-reach places.  If you need to reach something above you, use a strong step stool that has a grab bar.  Keep electrical cords out of the way.  Do not use floor polish or  wax that makes floors slippery. If you must use wax, use non-skid floor wax.  Do not have throw rugs and other things on the floor that can make you trip. What can I do with my stairs?  Do not leave any items on the stairs.  Make sure that there are handrails on both sides of the stairs and use them. Fix handrails that are broken or loose. Make sure that handrails are as long as the stairways.  Check any carpeting to make sure that it is firmly attached to the stairs. Fix any carpet that is loose or worn.  Avoid having throw rugs at the top or bottom of the stairs. If you do have throw rugs, attach them to the floor with carpet tape.  Make sure that you have a light switch at the top of the stairs and the bottom of the stairs. If you do not have them, ask someone to add them for you. What else can I do to help prevent falls?  Wear shoes that:  Do not have high heels.  Have  rubber bottoms.  Are comfortable and fit you well.  Are closed at the toe. Do not wear sandals.  If you use a stepladder:  Make sure that it is fully opened. Do not climb a closed stepladder.  Make sure that both sides of the stepladder are locked into place.  Ask someone to hold it for you, if possible.  Clearly mark and make sure that you can see:  Any grab bars or handrails.  First and last steps.  Where the edge of each step is.  Use tools that help you move around (mobility aids) if they are needed. These include:  Canes.  Walkers.  Scooters.  Crutches.  Turn on the lights when you go into a dark area. Replace any light bulbs as soon as they burn out.  Set up your furniture so you have a clear path. Avoid moving your furniture around.  If any of your floors are uneven, fix them.  If there are any pets around you, be aware of where they are.  Review your medicines with your doctor. Some medicines can make you feel dizzy. This can increase your chance of falling. Ask your doctor what other things that you can do to help prevent falls. This information is not intended to replace advice given to you by your health care provider. Make sure you discuss any questions you have with your health care provider. Document Released: 10/06/2009 Document Revised: 05/17/2016 Document Reviewed: 01/14/2015 Elsevier Interactive Patient Education  2017 [employer].

## 2018-04-08 ENCOUNTER — Telehealth: Payer: Self-pay

## 2018-04-08 NOTE — Telephone Encounter (Signed)
Patient does have enough medication until tomorrow.

## 2018-04-08 NOTE — Telephone Encounter (Signed)
Alprazolam 0.25mg   Take one tablet by mouth twice daily prn

## 2018-04-08 NOTE — Telephone Encounter (Signed)
Can we check to see if she has enough to last until her appointment tomorrow?

## 2018-04-09 ENCOUNTER — Ambulatory Visit (INDEPENDENT_AMBULATORY_CARE_PROVIDER_SITE_OTHER): Payer: Medicare Other | Admitting: Family Medicine

## 2018-04-09 ENCOUNTER — Encounter: Payer: Self-pay | Admitting: Family Medicine

## 2018-04-09 VITALS — BP 151/82 | HR 69 | Temp 98.4°F | Wt 144.1 lb

## 2018-04-09 DIAGNOSIS — E782 Mixed hyperlipidemia: Secondary | ICD-10-CM

## 2018-04-09 DIAGNOSIS — Z0000 Encounter for general adult medical examination without abnormal findings: Secondary | ICD-10-CM | POA: Diagnosis not present

## 2018-04-09 DIAGNOSIS — F411 Generalized anxiety disorder: Secondary | ICD-10-CM

## 2018-04-09 DIAGNOSIS — I700 Atherosclerosis of aorta: Secondary | ICD-10-CM | POA: Diagnosis not present

## 2018-04-09 DIAGNOSIS — F3341 Major depressive disorder, recurrent, in partial remission: Secondary | ICD-10-CM | POA: Diagnosis not present

## 2018-04-09 DIAGNOSIS — K219 Gastro-esophageal reflux disease without esophagitis: Secondary | ICD-10-CM | POA: Diagnosis not present

## 2018-04-09 DIAGNOSIS — I10 Essential (primary) hypertension: Secondary | ICD-10-CM

## 2018-04-09 DIAGNOSIS — N390 Urinary tract infection, site not specified: Secondary | ICD-10-CM | POA: Diagnosis not present

## 2018-04-09 MED ORDER — VALSARTAN-HYDROCHLOROTHIAZIDE 160-25 MG PO TABS: 1.0000 | ORAL_TABLET | Freq: Every day | ORAL | 1 refills | Status: DC

## 2018-04-09 MED ORDER — VENLAFAXINE HCL ER 75 MG PO CP24: 75.0000 mg | ORAL_CAPSULE | Freq: Every day | ORAL | 3 refills | Status: DC

## 2018-04-09 MED ORDER — PANTOPRAZOLE SODIUM 20 MG PO TBEC: 40.0000 mg | DELAYED_RELEASE_TABLET | Freq: Every day | ORAL | 1 refills | Status: DC

## 2018-04-09 MED ORDER — METOPROLOL SUCCINATE ER 25 MG PO TB24
12.5000 mg | ORAL_TABLET | Freq: Every day | ORAL | 1 refills | Status: DC
Start: 2018-04-09 — End: 2018-08-29

## 2018-04-09 MED ORDER — ALPRAZOLAM 0.25 MG PO TABS: 0.2500 mg | ORAL_TABLET | Freq: Two times a day (BID) | ORAL | 0 refills | Status: DC | PRN

## 2018-04-09 MED ORDER — TRAZODONE HCL 50 MG PO TABS: 25.0000 mg | ORAL_TABLET | Freq: Every day | ORAL | 1 refills | Status: DC

## 2018-04-09 NOTE — Assessment & Plan Note (Signed)
Stable on current regimen. Continue current regimen. Continue to monitor. Call with any concerns.  

## 2018-04-09 NOTE — Assessment & Plan Note (Signed)
Slightly elevated due to stress. Continue current regimen. Continue to monitor. Call with any concerns.

## 2018-04-09 NOTE — Progress Notes (Signed)
BP (!) 151/82 (BP Location: Left Arm, Patient Position: Sitting, Cuff Size: Normal)   Pulse 69   Temp 98.4 F (36.9 C)   Wt 144 lb 2 oz (65.4 kg)   BMI 26.36 kg/m    Subjective:    Patient ID: Michele Meyer, female    DOB: [birth year], 72 y.o.   MRN: [id number]  HPI: Michele Meyer is a 72 y.o. female presenting on 04/09/2018 for comprehensive medical examination. Current medical complaints include:  DEPRESSION- still not doing great. 75mg  of the zoloft still making her feel more heavy and tired. She has been doing a lot worse. Her husband was let go from his job. Her son is still having trouble. She is still having issues with her son's fiance's family. She notes that she is not good  Mood status: uncontrolled Satisfied with current treatment?: no Symptom severity: moderate  Duration of current treatment : months Side effects: yes Medication compliance: fair compliance Psychotherapy/counseling: no  Previous psychiatric medications: prozac, sertraline Depressed mood: yes Anxious mood: yes Anhedonia: no Significant weight loss or gain: no Insomnia: no  Fatigue: yes Feelings of worthlessness or guilt: yes Impaired concentration/indecisiveness: no Suicidal ideations: no Hopelessness: yes Crying spells: yes Depression screen [facility] 2/9 04/09/2018 04/07/2018 01/20/2018 10/12/2016 07/03/2016  Decreased Interest 0 0 1 0 0  Down, Depressed, Hopeless 1 0 0 0 0  PHQ - 2 Score 1 0 1 0 0  Altered sleeping 0 - 0 - -  Tired, decreased energy 2 - 0 - -  Change in appetite 0 - 0 - -  Feeling bad or failure about yourself  0 - 0 - -  Trouble concentrating 0 - 0 - -  Moving slowly or fidgety/restless 0 - 0 - -  Suicidal thoughts 0 - 0 - -  PHQ-9 Score 3 - 1 - -  Difficult doing work/chores Somewhat difficult - - - -   GAD 7 : Generalized Anxiety Score 04/09/2018  Nervous, Anxious, on Edge 2  Control/stop worrying 3  Worry too much - different things 2  Trouble relaxing 0  Restless 2    Easily annoyed or irritable 0  Afraid - awful might happen 3  Total GAD 7 Score 12  Anxiety Difficulty Somewhat difficult    HYPERTENSION / HYPERLIPIDEMIA Satisfied with current treatment? yes Duration of hypertension: chronic BP monitoring frequency: not checking BP medication side effects: no Past BP meds: valsartan-hctz, metoprolol Duration of hyperlipidemia: chronic Cholesterol medication side effects: Not on anything Cholesterol supplements: none Past cholesterol medications: none Medication compliance: excellent compliance Aspirin: no Recent stressors: yes Recurrent headaches: yes Visual changes: no Palpitations: yes Dyspnea: no Chest pain: no Lower extremity edema: no Dizzy/lightheaded: yes  She currently lives with: husband Menopausal Symptoms: no  Depression Screen done today and results listed below:  Depression screen [facility] 2/9 04/09/2018 04/07/2018 01/20/2018 10/12/2016 07/03/2016  Decreased Interest 0 0 1 0 0  Down, Depressed, Hopeless 1 0 0 0 0  PHQ - 2 Score 1 0 1 0 0  Altered sleeping 0 - 0 - -  Tired, decreased energy 2 - 0 - -  Change in appetite 0 - 0 - -  Feeling bad or failure about yourself  0 - 0 - -  Trouble concentrating 0 - 0 - -  Moving slowly or fidgety/restless 0 - 0 - -  Suicidal thoughts 0 - 0 - -  PHQ-9 Score 3 - 1 - -  Difficult doing work/chores Somewhat difficult - - - -  Past Medical History:  Past Medical History:  Diagnosis Date  . Anxiety   . Arthritis   . Bladder filling defect    bladder sling protrusing last 3 years   . Chronic cystitis   . Cyst of right kidney   . Cystocele   . Depression   . Diverticulosis   . Dyslipidemia   . Endometriosis   . Gestational diabetes mellitus 30 years ago   with pregnancy   . Glaucoma    both eyes  . Incomplete bladder emptying   . Labile hypertension   . PONV (postoperative nausea and vomiting)   . Skin cancer   . Skin cancer    basal and squamous cell  . Stress  incontinence   . Uterovaginal prolapse, incomplete   . UTI (lower urinary tract infection)     Surgical History:  Past Surgical History:  Procedure Laterality Date  . ABDOMINAL HYSTERECTOMY     complete  . ANTERIOR AND POSTERIOR VAGINAL REPAIR    . APPENDECTOMY    . BREAST BIOPSY    . BREAST LUMPECTOMY     benign  . prolapsed bladder     Repair Dr.Cope  . PUBOVAGINAL SLING  4 years ago   protrusion of bladder sling for last 3 years  . removal of first rib     bilaterally  . TONSILLECTOMY    . TOTAL KNEE ARTHROPLASTY Right 10/03/2015   Procedure: RIGHT TOTAL KNEE ARTHROPLASTY;  Surgeon: Gaynelle Arabian, MD;  Location: [facility];  Service: Orthopedics;  Laterality: Right;  . VEIN LIGATION AND STRIPPING Bilateral     Medications:  Current Outpatient Medications on File Prior to Visit  Medication Sig  . calcium carbonate (CALCIUM 600) 600 MG TABS tablet Take 1,200 mg by mouth 2 (two) times daily with a meal.  . cholecalciferol (VITAMIN D) 1000 units tablet Take 1,000 Units by mouth daily.  Marland Kitchen latanoprost (XALATAN) 0.005 % ophthalmic solution Place 1 drop into both eyes at bedtime.   Marland Kitchen nystatin (MYCOSTATIN/NYSTOP) powder APPLY TO AFFECTED AREA 3 TIMES DAILY  . sertraline (ZOLOFT) 50 MG tablet Take 1.5 tablets (75 mg total) by mouth daily.   No current facility-administered medications on file prior to visit.     Allergies:  Allergies  Allergen Reactions  . Ace Inhibitors     cough  . Codeine Nausea And Vomiting  . Erythromycin Diarrhea  . Fosamax [Alendronate] Other (See Comments)    GI upset  . Hydroxychloroquine     Other reaction(s): Abdominal Pain  . Prednisone Other (See Comments)    Headache, GI upset, can take if necessary  . Reclast [Zoledronic Acid] Hives  . Latex     Sensitive to it but not allergic  . Penicillins Rash    Has patient had a PCN reaction causing immediate rash, facial/tongue/throat swelling, SOB or lightheadedness with hypotension: No Has patient  had a PCN reaction causing severe rash involving mucus membranes or skin necrosis: No Has patient had a PCN reaction that required hospitalization No Has patient had a PCN reaction occurring within the last 10 years: No If all of the above answers are "NO", then may proceed with Cephalosporin use.    Social History:  Social History   Socioeconomic History  . Marital status: Married    Spouse name: Not on file  . Number of children: 5  . Years of education: Not on file  . Highest education level: Not on file  Occupational History    Employer:  retired  Scientific laboratory technician  . Financial resource strain: Not very hard  . Food insecurity:    Worry: Never true    Inability: Never true  . Transportation needs:    Medical: No    Non-medical: No  Tobacco Use  . Smoking status: Former Smoker    Last attempt to quit: 02/25/1964    Years since quitting: 54.1  . Smokeless tobacco: Never Used  . Tobacco comment: smoked for two months socially   Substance and Sexual Activity  . Alcohol use: Never    Frequency: Never  . Drug use: No  . Sexual activity: Not Currently  Lifestyle  . Physical activity:    Days per week: 0 days    Minutes per session: 0 min  . Stress: Not at all  Relationships  . Social connections:    Talks on phone: More than three times a week    Gets together: More than three times a week    Attends religious service: More than 4 times per year    Active member of club or organization: Yes    Attends meetings of clubs or organizations: More than 4 times per year    Relationship status: Married  . Intimate partner violence:    Fear of current or ex partner: No    Emotionally abused: No    Physically abused: No    Forced sexual activity: No  Other Topics Concern  . Not on file  Social History Narrative   Married, mother of 80, with at least one granddaughter who is present today.   Daily Caffeine Use:  2 cups in am;   She does not exercise routinely. Former smoker who  quit in 1965.   Takes occasional alcohol beverage.   As the name is [facility] granddaughter's name is Tarissa Kerin   Social History   Tobacco Use  Smoking Status Former Smoker  . Last attempt to quit: 02/25/1964  . Years since quitting: 54.1  Smokeless Tobacco Never Used  Tobacco Comment   smoked for two months socially    Social History   Substance and Sexual Activity  Alcohol Use Never  . Frequency: Never    Family History:  Family History  Problem Relation Age of Onset  . Diabetes Mother   . Hypertension Mother   . Cervical cancer Mother   . Skin cancer Mother   . Hypercholesterolemia Mother   . Heart disease Mother   . Kidney disease Mother   . Depression Father   . Hypertension Father   . Hypercholesterolemia Father   . Colon cancer Neg Hx     Past medical history, surgical history, medications, allergies, family history and social history reviewed with patient today and changes made to appropriate areas of the chart.   Review of Systems  Constitutional: Positive for diaphoresis. Negative for chills, fever, malaise/fatigue and weight loss.  HENT: Positive for ear pain and hearing loss. Negative for congestion, ear discharge, nosebleeds, sinus pain, sore throat and tinnitus.        Still having ear pain  Eyes: Negative.   Respiratory: Negative.  Negative for stridor.   Cardiovascular: Positive for palpitations. Negative for chest pain, orthopnea, claudication, leg swelling and PND.  Gastrointestinal: Negative.   Genitourinary: Positive for frequency. Negative for dysuria, flank pain, hematuria and urgency.  Musculoskeletal: Positive for myalgias. Negative for back pain, falls, joint pain and neck pain.  Skin: Negative.   Neurological: Positive for dizziness and headaches. Negative for tingling, tremors, sensory  change, speech change, focal weakness, seizures, loss of consciousness and weakness.  Endo/Heme/Allergies: Positive for environmental allergies and polydipsia.  Bruises/bleeds easily.  Psychiatric/Behavioral: Positive for depression. Negative for hallucinations, memory loss, substance abuse and suicidal ideas. The patient is nervous/anxious and has insomnia.     All other ROS negative except what is listed above and in the HPI.      Objective:    BP (!) 151/82 (BP Location: Left Arm, Patient Position: Sitting, Cuff Size: Normal)   Pulse 69   Temp 98.4 F (36.9 C)   Wt 144 lb 2 oz (65.4 kg)   BMI 26.36 kg/m   Wt Readings from Last 3 Encounters:  04/09/18 144 lb 2 oz (65.4 kg)  04/07/18 144 lb 4.8 oz (65.5 kg)  03/10/18 142 lb (64.4 kg)    Physical Exam  Constitutional: She is oriented to person, place, and time. She appears well-developed and well-nourished. No distress.  HENT:  Head: Normocephalic and atraumatic.  Right Ear: Hearing, tympanic membrane, external ear and ear canal normal.  Left Ear: Hearing, tympanic membrane, external ear and ear canal normal.  Nose: Nose normal.  Mouth/Throat: Uvula is midline, oropharynx is clear and moist and mucous membranes are normal. No oropharyngeal exudate.  Eyes: Pupils are equal, round, and reactive to light. Conjunctivae, EOM and lids are normal. Right eye exhibits no discharge. Left eye exhibits no discharge. No scleral icterus.  Neck: Normal range of motion. Neck supple. No JVD present. No tracheal deviation present. No thyromegaly present.  Cardiovascular: Normal rate, regular rhythm, normal heart sounds and intact distal pulses. Exam reveals no gallop and no friction rub.  No murmur heard. Pulmonary/Chest: Effort normal and breath sounds normal. No stridor. No respiratory distress. She has no wheezes. She has no rales. She exhibits no tenderness.  Abdominal: Soft. Bowel sounds are normal. She exhibits no distension and no mass. There is no tenderness. There is no rebound and no guarding. No hernia.  Genitourinary:  Genitourinary Comments: Breast and pelvic exams deferred with shared  decision making  Musculoskeletal: Normal range of motion. She exhibits no edema, tenderness or deformity.  Lymphadenopathy:    She has no cervical adenopathy.  Neurological: She is alert and oriented to person, place, and time. She displays normal reflexes. No cranial nerve deficit or sensory deficit. She exhibits normal muscle tone. Coordination normal.  Skin: Skin is warm, dry and intact. Capillary refill takes less than 2 seconds. No rash noted. She is not diaphoretic. No erythema. No pallor.  Psychiatric: Her speech is normal and behavior is normal. Judgment and thought content normal. Her mood appears anxious. Cognition and memory are normal. She exhibits a depressed mood.  Nursing note and vitals reviewed.   Results for orders placed or performed in visit on 01/20/18  Microscopic Examination  Result Value Ref Range   WBC, UA 0-5 0 - 5 /hpf   RBC, UA None seen 0 - 2 /hpf   Epithelial Cells (non renal) 0-10 0 - 10 /hpf   Bacteria, UA Few None seen/Few  Urine Culture, Reflex  Result Value Ref Range   Urine Culture, Routine Final report    Organism ID, Bacteria Comment   CBC with Differential/Platelet  Result Value Ref Range   WBC 7.9 3.4 - 10.8 x10E3/uL   RBC 4.24 3.77 - 5.28 x10E6/uL   Hemoglobin 12.6 11.1 - 15.9 g/dL   Hematocrit 37.2 34.0 - 46.6 %   MCV 88 79 - 97 fL   MCH 29.7 26.6 -  33.0 pg   MCHC 33.9 31.5 - 35.7 g/dL   RDW 12.8 12.3 - 15.4 %   Platelets 302 150 - 379 x10E3/uL   Neutrophils 65 Not Estab. %   Lymphs 18 Not Estab. %   Monocytes 8 Not Estab. %   Eos 8 Not Estab. %   Basos 1 Not Estab. %   Neutrophils Absolute 5.2 1.4 - 7.0 x10E3/uL   Lymphocytes Absolute 1.4 0.7 - 3.1 x10E3/uL   Monocytes Absolute 0.6 0.1 - 0.9 x10E3/uL   EOS (ABSOLUTE) 0.6 (H) 0.0 - 0.4 x10E3/uL   Basophils Absolute 0.0 0.0 - 0.2 x10E3/uL   Immature Granulocytes 0 Not Estab. %   Immature Grans (Abs) 0.0 0.0 - 0.1 x10E3/uL  Comprehensive metabolic panel  Result Value Ref Range    Glucose 102 (H) 65 - 99 mg/dL   BUN 17 8 - 27 mg/dL   Creatinine, Ser 0.80 0.57 - 1.00 mg/dL   GFR calc non Af Amer 73 >59 mL/min/1.73   GFR calc Af Amer 85 >59 mL/min/1.73   BUN/Creatinine Ratio 21 12 - 28   Sodium 132 (L) 134 - 144 mmol/L   Potassium 4.1 3.5 - 5.2 mmol/L   Chloride 92 (L) 96 - 106 mmol/L   CO2 24 20 - 29 mmol/L   Calcium 10.0 8.7 - 10.3 mg/dL   Total Protein 7.2 6.0 - 8.5 g/dL   Albumin 4.3 3.5 - 4.8 g/dL   Globulin, Total 2.9 1.5 - 4.5 g/dL   Albumin/Globulin Ratio 1.5 1.2 - 2.2   Bilirubin Total 0.3 0.0 - 1.2 mg/dL   Alkaline Phosphatase 70 39 - 117 IU/L   AST 20 0 - 40 IU/L   ALT 16 0 - 32 IU/L  Lipid Panel w/o Chol/HDL Ratio  Result Value Ref Range   Cholesterol, Total 232 (H) 100 - 199 mg/dL   Triglycerides 142 0 - 149 mg/dL   HDL 102 >39 mg/dL   VLDL Cholesterol Cal 28 5 - 40 mg/dL   LDL Calculated 102 (H) 0 - 99 mg/dL  Microalbumin, Urine Waived  Result Value Ref Range   Microalb, Ur Waived 10 0 - 19 mg/L   Creatinine, Urine Waived 50 10 - 300 mg/dL   Microalb/Creat Ratio 30-300 (H) <30 mg/g  TSH  Result Value Ref Range   TSH 1.530 0.450 - 4.500 uIU/mL  UA/M w/rflx Culture, Routine  Result Value Ref Range   Specific Gravity, UA 1.010 1.005 - 1.030   pH, UA 5.0 5.0 - 7.5   Color, UA Yellow Yellow   Appearance Ur Hazy (A) Clear   Leukocytes, UA 1+ (A) Negative   Protein, UA Negative Negative/Trace   Glucose, UA Negative Negative   Ketones, UA Negative Negative   RBC, UA Trace (A) Negative   Bilirubin, UA Negative Negative   Urobilinogen, Ur 0.2 0.2 - 1.0 mg/dL   Nitrite, UA Negative Negative   Microscopic Examination See below:    Urinalysis Reflex Comment   Bayer DCA Hb A1c Waived  Result Value Ref Range   Bayer DCA Hb A1c Waived 5.3 <7.0 %      Assessment & Plan:   Problem List Items Addressed This Visit      Cardiovascular and Mediastinum   Hypertension (Chronic)    Slightly elevated due to stress. Continue current regimen.  Continue to monitor. Call with any concerns.       Relevant Medications   metoprolol succinate (TOPROL-XL) 25 MG 24 hr tablet   valsartan-hydrochlorothiazide (  DIOVAN-HCT) 160-25 MG tablet   Other Relevant Orders   CBC with Differential/Platelet   Comprehensive metabolic panel   Microalbumin, Urine Waived   TSH   UA/M w/rflx Culture, Routine   Aortic atherosclerosis (HCC)    Continue BP and cholesterol control. Call with any concerns. Continue to monitor.       Relevant Medications   metoprolol succinate (TOPROL-XL) 25 MG 24 hr tablet   valsartan-hydrochlorothiazide (DIOVAN-HCT) 160-25 MG tablet   Other Relevant Orders   CBC with Differential/Platelet   Comprehensive metabolic panel   TSH   UA/M w/rflx Culture, Routine     Digestive   GERD (gastroesophageal reflux disease)    Stable on current regimen. Continue current regimen. Continue to monitor. Call with any concerns.       Relevant Medications   pantoprazole (PROTONIX) 20 MG tablet   Other Relevant Orders   CBC with Differential/Platelet   Comprehensive metabolic panel   TSH   UA/M w/rflx Culture, Routine     Other   Hyperlipidemia (Chronic)    Rechecking levels today. Continue current regimen. Continue to monitor. Call with any concerns.       Relevant Medications   metoprolol succinate (TOPROL-XL) 25 MG 24 hr tablet   valsartan-hydrochlorothiazide (DIOVAN-HCT) 160-25 MG tablet   Other Relevant Orders   CBC with Differential/Platelet   Comprehensive metabolic panel   Lipid Panel w/o Chol/HDL Ratio   TSH   UA/M w/rflx Culture, Routine   Generalized anxiety disorder    Not under good control. Will change from zoloft to effexor. Continue xanax. Call with any concerns. Continue to monitor. Recheck 1 month.       Relevant Medications   venlafaxine XR (EFFEXOR XR) 75 MG 24 hr capsule   ALPRAZolam (XANAX) 0.25 MG tablet   traZODone (DESYREL) 50 MG tablet   Other Relevant Orders   CBC with  Differential/Platelet   Comprehensive metabolic panel   TSH   UA/M w/rflx Culture, Routine   Depression    Not under good control. Will change from zoloft to effexor. Continue xanax. Call with any concerns. Continue to monitor. Recheck 1 month.       Relevant Medications   venlafaxine XR (EFFEXOR XR) 75 MG 24 hr capsule   ALPRAZolam (XANAX) 0.25 MG tablet   traZODone (DESYREL) 50 MG tablet   Other Relevant Orders   CBC with Differential/Platelet   Comprehensive metabolic panel   TSH   UA/M w/rflx Culture, Routine    Other Visit Diagnoses    Routine general medical examination at a health care facility    -  Primary   Vaccines up to date. Screening labs checked today. Pap N/A. Mammogram ordered. Colonoscopy refused. DEXA up to date. Continue diet and exercise.       Follow up plan: Return in about 1 month (around 05/07/2018) for follow up mood.   LABORATORY TESTING:  - Pap smear: not applicable  IMMUNIZATIONS:   - Tdap: Tetanus vaccination status reviewed: last tetanus booster within 10 years. - Influenza: Postponed to flu season - Pneumovax: Up to date - Prevnar: Up to date - HPV: Not applicable - Zostavax vaccine: Not applicable  SCREENING: -Mammogram: Ordered today  - Colonoscopy: Refused  - Bone Density: Up to date   PATIENT COUNSELING:   Advised to take 1 mg of folate supplement per day if capable of pregnancy.   Sexuality: Discussed sexually transmitted diseases, partner selection, use of condoms, avoidance of unintended pregnancy  and contraceptive alternatives.  Advised to avoid cigarette smoking.  I discussed with the patient that most people either abstain from alcohol or drink within safe limits (<=14/week and <=4 drinks/occasion for males, <=7/weeks and <= 3 drinks/occasion for females) and that the risk for alcohol disorders and other health effects rises proportionally with the number of drinks per week and how often a drinker exceeds daily  limits.  Discussed cessation/primary prevention of drug use and availability of treatment for abuse.   Diet: Encouraged to adjust caloric intake to maintain  or achieve ideal body weight, to reduce intake of dietary saturated fat and total fat, to limit sodium intake by avoiding high sodium foods and not adding table salt, and to maintain adequate dietary potassium and calcium preferably from fresh fruits, vegetables, and low-fat dairy products.    stressed the importance of regular exercise  Injury prevention: Discussed safety belts, safety helmets, smoke detector, smoking near bedding or upholstery.   Dental health: Discussed importance of regular tooth brushing, flossing, and dental visits.    NEXT PREVENTATIVE PHYSICAL DUE IN 1 YEAR. Return in about 1 month (around 05/07/2018) for follow up mood.

## 2018-04-09 NOTE — Assessment & Plan Note (Signed)
Continue BP and cholesterol control. Call with any concerns. Continue to monitor.

## 2018-04-09 NOTE — Assessment & Plan Note (Signed)
Not under good control. Will change from zoloft to effexor. Continue xanax. Call with any concerns. Continue to monitor. Recheck 1 month.

## 2018-04-09 NOTE — Patient Instructions (Signed)
Health Maintenance for Postmenopausal Women Menopause is a normal process in which your reproductive ability comes to an end. This process happens gradually over a span of months to years, usually between the ages of 22 and 9. Menopause is complete when you have missed 12 consecutive menstrual periods. It is important to talk with your health care provider about some of the most common conditions that affect postmenopausal women, such as heart disease, cancer, and bone loss (osteoporosis). Adopting a healthy lifestyle and getting preventive care can help to promote your health and wellness. Those actions can also lower your chances of developing some of these common conditions. What should I know about menopause? During menopause, you may experience a number of symptoms, such as:  Moderate-to-severe hot flashes.  Night sweats.  Decrease in sex drive.  Mood swings.  Headaches.  Tiredness.  Irritability.  Memory problems.  Insomnia.  Choosing to treat or not to treat menopausal changes is an individual decision that you make with your health care provider. What should I know about hormone replacement therapy and supplements? Hormone therapy products are effective for treating symptoms that are associated with menopause, such as hot flashes and night sweats. Hormone replacement carries certain risks, especially as you become older. If you are thinking about using estrogen or estrogen with progestin treatments, discuss the benefits and risks with your health care provider. What should I know about heart disease and stroke? Heart disease, heart attack, and stroke become more likely as you age. This may be due, in part, to the hormonal changes that your body experiences during menopause. These can affect how your body processes dietary fats, triglycerides, and cholesterol. Heart attack and stroke are both medical emergencies. There are many things that you can do to help prevent heart disease  and stroke:  Have your blood pressure checked at least every 1-2 years. High blood pressure causes heart disease and increases the risk of stroke.  If you are 53-22 years old, ask your health care provider if you should take aspirin to prevent a heart attack or a stroke.  Do not use any tobacco products, including cigarettes, chewing tobacco, or electronic cigarettes. If you need help quitting, ask your health care provider.  It is important to eat a healthy diet and maintain a healthy weight. ? Be sure to include plenty of vegetables, fruits, low-fat dairy products, and lean protein. ? Avoid eating foods that are high in solid fats, added sugars, or salt (sodium).  Get regular exercise. This is one of the most important things that you can do for your health. ? Try to exercise for at least 150 minutes each week. The type of exercise that you do should increase your heart rate and make you sweat. This is known as moderate-intensity exercise. ? Try to do strengthening exercises at least twice each week. Do these in addition to the moderate-intensity exercise.  Know your numbers.Ask your health care provider to check your cholesterol and your blood glucose. Continue to have your blood tested as directed by your health care provider.  What should I know about cancer screening? There are several types of cancer. Take the following steps to reduce your risk and to catch any cancer development as early as possible. Breast Cancer  Practice breast self-awareness. ? This means understanding how your breasts normally appear and feel. ? It also means doing regular breast self-exams. Let your health care provider know about any changes, no matter how small.  If you are 40  or older, have a clinician do a breast exam (clinical breast exam or CBE) every year. Depending on your age, family history, and medical history, it may be recommended that you also have a yearly breast X-ray (mammogram).  If you  have a family history of breast cancer, talk with your health care provider about genetic screening.  If you are at high risk for breast cancer, talk with your health care provider about having an MRI and a mammogram every year.  Breast cancer (BRCA) gene test is recommended for women who have family members with BRCA-related cancers. Results of the assessment will determine the need for genetic counseling and BRCA1 and for BRCA2 testing. BRCA-related cancers include these types: ? Breast. This occurs in males or females. ? Ovarian. ? Tubal. This may also be called fallopian tube cancer. ? Cancer of the abdominal or pelvic lining (peritoneal cancer). ? Prostate. ? Pancreatic.  Cervical, Uterine, and Ovarian Cancer Your health care provider may recommend that you be screened regularly for cancer of the pelvic organs. These include your ovaries, uterus, and vagina. This screening involves a pelvic exam, which includes checking for microscopic changes to the surface of your cervix (Pap test).  For women ages 21-65, health care providers may recommend a pelvic exam and a Pap test every three years. For women ages 79-65, they may recommend the Pap test and pelvic exam, combined with testing for human papilloma virus (HPV), every five years. Some types of HPV increase your risk of cervical cancer. Testing for HPV may also be done on women of any age who have unclear Pap test results.  Other health care providers may not recommend any screening for nonpregnant women who are considered low risk for pelvic cancer and have no symptoms. Ask your health care provider if a screening pelvic exam is right for you.  If you have had past treatment for cervical cancer or a condition that could lead to cancer, you need Pap tests and screening for cancer for at least 20 years after your treatment. If Pap tests have been discontinued for you, your risk factors (such as having a new sexual partner) need to be  reassessed to determine if you should start having screenings again. Some women have medical problems that increase the chance of getting cervical cancer. In these cases, your health care provider may recommend that you have screening and Pap tests more often.  If you have a family history of uterine cancer or ovarian cancer, talk with your health care provider about genetic screening.  If you have vaginal bleeding after reaching menopause, tell your health care provider.  There are currently no reliable tests available to screen for ovarian cancer.  Lung Cancer Lung cancer screening is recommended for adults 69-62 years old who are at high risk for lung cancer because of a history of smoking. A yearly low-dose CT scan of the lungs is recommended if you:  Currently smoke.  Have a history of at least 30 pack-years of smoking and you currently smoke or have quit within the past 15 years. A pack-year is smoking an average of one pack of cigarettes per day for one year.  Yearly screening should:  Continue until it has been 15 years since you quit.  Stop if you develop a health problem that would prevent you from having lung cancer treatment.  Colorectal Cancer  This type of cancer can be detected and can often be prevented.  Routine colorectal cancer screening usually begins at  age 42 and continues through age 45.  If you have risk factors for colon cancer, your health care provider may recommend that you be screened at an earlier age.  If you have a family history of colorectal cancer, talk with your health care provider about genetic screening.  Your health care provider may also recommend using home test kits to check for hidden blood in your stool.  A small camera at the end of a tube can be used to examine your colon directly (sigmoidoscopy or colonoscopy). This is done to check for the earliest forms of colorectal cancer.  Direct examination of the colon should be repeated every  5-10 years until age 71. However, if early forms of precancerous polyps or small growths are found or if you have a family history or genetic risk for colorectal cancer, you may need to be screened more often.  Skin Cancer  Check your skin from head to toe regularly.  Monitor any moles. Be sure to tell your health care provider: ? About any new moles or changes in moles, especially if there is a change in a mole's shape or color. ? If you have a mole that is larger than the size of a pencil eraser.  If any of your family members has a history of skin cancer, especially at a young age, talk with your health care provider about genetic screening.  Always use sunscreen. Apply sunscreen liberally and repeatedly throughout the day.  Whenever you are outside, protect yourself by wearing long sleeves, pants, a wide-brimmed hat, and sunglasses.  What should I know about osteoporosis? Osteoporosis is a condition in which bone destruction happens more quickly than new bone creation. After menopause, you may be at an increased risk for osteoporosis. To help prevent osteoporosis or the bone fractures that can happen because of osteoporosis, the following is recommended:  If you are 46-71 years old, get at least 1,000 mg of calcium and at least 600 mg of vitamin D per day.  If you are older than age 55 but younger than age 65, get at least 1,200 mg of calcium and at least 600 mg of vitamin D per day.  If you are older than age 54, get at least 1,200 mg of calcium and at least 800 mg of vitamin D per day.  Smoking and excessive alcohol intake increase the risk of osteoporosis. Eat foods that are rich in calcium and vitamin D, and do weight-bearing exercises several times each week as directed by your health care provider. What should I know about how menopause affects my mental health? Depression may occur at any age, but it is more common as you become older. Common symptoms of depression  include:  Low or sad mood.  Changes in sleep patterns.  Changes in appetite or eating patterns.  Feeling an overall lack of motivation or enjoyment of activities that you previously enjoyed.  Frequent crying spells.  Talk with your health care provider if you think that you are experiencing depression. What should I know about immunizations? It is important that you get and maintain your immunizations. These include:  Tetanus, diphtheria, and pertussis (Tdap) booster vaccine.  Influenza every year before the flu season begins.  Pneumonia vaccine.  Shingles vaccine.  Your health care provider may also recommend other immunizations. This information is not intended to replace advice given to you by your health care provider. Make sure you discuss any questions you have with your health care provider. Document Released: 02/01/2006  Document Revised: 06/29/2016 Document Reviewed: 09/13/2015 Elsevier Interactive Patient Education  2018 Elsevier Inc.  

## 2018-04-09 NOTE — Assessment & Plan Note (Signed)
Rechecking levels today. Continue current regimen. Continue to monitor. Call with any concerns.  

## 2018-04-10 ENCOUNTER — Encounter: Payer: Self-pay | Admitting: Family Medicine

## 2018-04-10 LAB — CBC WITH DIFFERENTIAL/PLATELET
BASOS ABS: 0.1 10*3/uL (ref 0.0–0.2)
Basos: 1 %
EOS (ABSOLUTE): 0.6 10*3/uL — ABNORMAL HIGH (ref 0.0–0.4)
EOS: 8 %
HEMATOCRIT: 38.3 % (ref 34.0–46.6)
HEMOGLOBIN: 13.3 g/dL (ref 11.1–15.9)
Immature Grans (Abs): 0 10*3/uL (ref 0.0–0.1)
Immature Granulocytes: 0 %
LYMPHS ABS: 1.3 10*3/uL (ref 0.7–3.1)
LYMPHS: 17 %
MCH: 29.3 pg (ref 26.6–33.0)
MCHC: 34.7 g/dL (ref 31.5–35.7)
MCV: 84 fL (ref 79–97)
MONOCYTES: 8 %
Monocytes Absolute: 0.6 10*3/uL (ref 0.1–0.9)
NEUTROS ABS: 4.8 10*3/uL (ref 1.4–7.0)
Neutrophils: 66 %
Platelets: 290 10*3/uL (ref 150–379)
RBC: 4.54 x10E6/uL (ref 3.77–5.28)
RDW: 12.4 % (ref 12.3–15.4)
WBC: 7.4 10*3/uL (ref 3.4–10.8)

## 2018-04-10 LAB — COMPREHENSIVE METABOLIC PANEL
ALBUMIN: 4.1 g/dL (ref 3.5–4.8)
ALK PHOS: 65 IU/L (ref 39–117)
ALT: 17 IU/L (ref 0–32)
AST: 20 IU/L (ref 0–40)
Albumin/Globulin Ratio: 1.4 (ref 1.2–2.2)
BILIRUBIN TOTAL: 0.2 mg/dL (ref 0.0–1.2)
BUN / CREAT RATIO: 22 (ref 12–28)
BUN: 17 mg/dL (ref 8–27)
CO2: 27 mmol/L (ref 20–29)
CREATININE: 0.76 mg/dL (ref 0.57–1.00)
Calcium: 10.2 mg/dL (ref 8.7–10.3)
Chloride: 92 mmol/L — ABNORMAL LOW (ref 96–106)
GFR calc non Af Amer: 78 mL/min/{1.73_m2} (ref >59)
GFR, EST AFRICAN AMERICAN: 90 mL/min/{1.73_m2} (ref >59)
GLOBULIN, TOTAL: 3 g/dL (ref 1.5–4.5)
GLUCOSE: 91 mg/dL (ref 65–99)
Potassium: 4.3 mmol/L (ref 3.5–5.2)
SODIUM: 131 mmol/L — AB (ref 134–144)
TOTAL PROTEIN: 7.1 g/dL (ref 6.0–8.5)

## 2018-04-10 LAB — LIPID PANEL W/O CHOL/HDL RATIO
CHOLESTEROL TOTAL: 216 mg/dL — AB (ref 100–199)
HDL: 85 mg/dL (ref >39)
LDL CALC: 110 mg/dL — AB (ref 0–99)
Triglycerides: 107 mg/dL (ref 0–149)
VLDL CHOLESTEROL CAL: 21 mg/dL (ref 5–40)

## 2018-04-10 LAB — TSH: TSH: 1.4 u[IU]/mL (ref 0.450–4.500)

## 2018-04-11 LAB — MICROSCOPIC EXAMINATION: RBC, UA: NONE SEEN /HPF (ref 0–2)

## 2018-04-11 LAB — UA/M W/RFLX CULTURE, ROUTINE
Bilirubin, UA: NEGATIVE
Glucose, UA: NEGATIVE
Ketones, UA: NEGATIVE
Nitrite, UA: NEGATIVE
Protein, UA: NEGATIVE
RBC, UA: NEGATIVE
SPEC GRAV UA: 1.01 (ref 1.005–1.030)
Urobilinogen, Ur: 0.2 mg/dL (ref 0.2–1.0)
pH, UA: 6.5 (ref 5.0–7.5)

## 2018-04-11 LAB — URINE CULTURE, REFLEX: Organism ID, Bacteria: NO GROWTH

## 2018-04-11 LAB — MICROALBUMIN, URINE WAIVED
CREATININE, URINE WAIVED: 50 mg/dL (ref 10–300)
MICROALB, UR WAIVED: 10 mg/L (ref 0–19)

## 2018-04-22 DIAGNOSIS — Z1211 Encounter for screening for malignant neoplasm of colon: Secondary | ICD-10-CM | POA: Diagnosis not present

## 2018-04-23 ENCOUNTER — Ambulatory Visit: Payer: Self-pay | Admitting: Family Medicine

## 2018-05-02 LAB — COLOGUARD: Cologuard: POSITIVE

## 2018-05-05 ENCOUNTER — Telehealth: Payer: Self-pay | Admitting: Family Medicine

## 2018-05-05 DIAGNOSIS — R195 Other fecal abnormalities: Secondary | ICD-10-CM

## 2018-05-05 NOTE — Telephone Encounter (Signed)
Called patient with results. Will get her into GI for colonoscopy. Referral placed

## 2018-05-05 NOTE — Telephone Encounter (Signed)
Called patient with the results- she was not able to speak right now. Will call again later.

## 2018-05-07 ENCOUNTER — Telehealth: Payer: Self-pay | Admitting: Family Medicine

## 2018-05-07 ENCOUNTER — Other Ambulatory Visit: Payer: Self-pay

## 2018-05-07 NOTE — Telephone Encounter (Signed)
Copied from [place] [phone]. Topic: Inquiry >> May 07, 2018 10:08 AM Pricilla Handler wrote: Reason for CRM: Patient called and requested if Dr. Wynetta Emery would refill her ALPRAZolam Duanne Moron) 0.25 MG tablet today. Patient states that it will be two days early, but that she and her husband would like to leave today to go out of town. Patient's preferred pharmacy is [place], [place] - [address] [phone] (Phone)  [phone] (Fax).         Thank You!!!     Pt called back to ask if this can be sent in early. Pt going to [place] to see her children and will be back Sunday. Pt is living tonite, so would appreciate to get this today. Will need to be approved by the dr due to last refill was 04/09/18   Pt has appt Monday to see Dr Wynetta Emery as well

## 2018-05-07 NOTE — Progress Notes (Signed)
Gastroenterology Pre-Procedure Review  Request Date: 05/21/18 Requesting Physician: Dr. Darene Lamer  PATIENT REVIEW QUESTIONS: The patient responded to the following health history questions as indicated:    1. Are you having any GI issues? yes (Positive Colorguard) 2. Do you have a personal history of Polyps? no 3. Do you have a family history of Colon Cancer or Polyps? Yes mother polyps 4. Diabetes Mellitus? no 5. Joint replacements in the past 12 months?2 yearS AGO knee replacement 6. Major health problems in the past 3 months? RA, Osteoarthritis,  7. Any artificial heart valves, MVP, or defibrillator?no    MEDICATIONS & ALLERGIES:    Patient reports the following regarding taking any anticoagulation/antiplatelet therapy:   Plavix, Coumadin, Eliquis, Xarelto, Lovenox, Pradaxa, Brilinta, or Effient? no Aspirin? no  Patient confirms/reports the following medications:  Current Outpatient Medications  Medication Sig Dispense Refill  . ALPRAZolam (XANAX) 0.25 MG tablet Take 1 tablet (0.25 mg total) by mouth 2 (two) times daily as needed for anxiety. 45 tablet 0  . calcium carbonate (CALCIUM 600) 600 MG TABS tablet Take 1,200 mg by mouth 2 (two) times daily with a meal.    . cholecalciferol (VITAMIN D) 1000 units tablet Take 1,000 Units by mouth daily.    Marland Kitchen latanoprost (XALATAN) 0.005 % ophthalmic solution Place 1 drop into both eyes at bedtime.     . metoprolol succinate (TOPROL-XL) 25 MG 24 hr tablet Take 0.5 tablets (12.5 mg total) by mouth daily. 45 tablet 1  . nystatin (MYCOSTATIN/NYSTOP) powder APPLY TO AFFECTED AREA 3 TIMES DAILY 30 g 0  . pantoprazole (PROTONIX) 20 MG tablet Take 2 tablets (40 mg total) by mouth daily. 180 tablet 1  . sertraline (ZOLOFT) 50 MG tablet Take 1.5 tablets (75 mg total) by mouth daily. 45 tablet 3  . traZODone (DESYREL) 50 MG tablet Take 0.5 tablets (25 mg total) by mouth at bedtime. 45 tablet 1  . valsartan-hydrochlorothiazide (DIOVAN-HCT) 160-25 MG tablet Take  1 tablet by mouth daily. 90 tablet 1  . venlafaxine XR (EFFEXOR XR) 75 MG 24 hr capsule Take 1 capsule (75 mg total) by mouth daily with breakfast. 30 capsule 3   No current facility-administered medications for this visit.     Patient confirms/reports the following allergies:  Allergies  Allergen Reactions  . Ace Inhibitors     cough  . Codeine Nausea And Vomiting  . Erythromycin Diarrhea  . Fosamax [Alendronate] Other (See Comments)    GI upset  . Hydroxychloroquine     Other reaction(s): Abdominal Pain  . Prednisone Other (See Comments)    Headache, GI upset, can take if necessary  . Reclast [Zoledronic Acid] Hives  . Latex     Sensitive to it but not allergic  . Penicillins Rash    Has patient had a PCN reaction causing immediate rash, facial/tongue/throat swelling, SOB or lightheadedness with hypotension: No Has patient had a PCN reaction causing severe rash involving mucus membranes or skin necrosis: No Has patient had a PCN reaction that required hospitalization No Has patient had a PCN reaction occurring within the last 10 years: No If all of the above answers are "NO", then may proceed with Cephalosporin use.    No orders of the defined types were placed in this encounter.   AUTHORIZATION INFORMATION Primary Insurance: 1D#: Group #:  Secondary Insurance: 1D#: Group #:  SCHEDULE INFORMATION: Date: 05/21/18 Time: Location:[facility]

## 2018-05-08 ENCOUNTER — Other Ambulatory Visit: Payer: Self-pay

## 2018-05-08 ENCOUNTER — Telehealth: Payer: Self-pay | Admitting: Family Medicine

## 2018-05-08 ENCOUNTER — Telehealth: Payer: Self-pay | Admitting: Gastroenterology

## 2018-05-08 DIAGNOSIS — R195 Other fecal abnormalities: Secondary | ICD-10-CM

## 2018-05-08 MED ORDER — PEG 3350-KCL-NABCB-NACL-NASULF 236 G PO SOLR: 4000.0000 mL | Freq: Once | ORAL | 0 refills | Status: AC

## 2018-05-08 MED ORDER — ALPRAZOLAM 0.25 MG PO TABS: 0.2500 mg | ORAL_TABLET | Freq: Two times a day (BID) | ORAL | 0 refills | Status: DC | PRN

## 2018-05-08 NOTE — Telephone Encounter (Signed)
Patient notified, she has an appointment scheduled for monday

## 2018-05-08 NOTE — Telephone Encounter (Signed)
Needs appointment or to go to urgent care.  

## 2018-05-08 NOTE — Telephone Encounter (Signed)
Copied from [place] [phone]. Topic: Inquiry >> May 07, 2018 10:08 AM Pricilla Handler wrote: Reason for CRM: Patient called and requested if Dr. Wynetta Emery would refill her ALPRAZolam Duanne Moron) 0.25 MG tablet today. Patient states that it will be two days early, but that she and her husband would like to leave today to go out of town. Patient's preferred pharmacy is [place], [place] - [place] [phone] (Phone)  [phone] (Fax).         Thank You!!!      >> May 07, 2018  4:43 PM Georgina Peer, CMA wrote: CRM already made into a telephone encounter and routed to Dr. Wynetta Emery.

## 2018-05-08 NOTE — Telephone Encounter (Signed)
Medication refill for Alprazolam 0.25mg 

## 2018-05-08 NOTE — Telephone Encounter (Signed)
Rx sent to her pharmacy. Please remind patient of 48 hour policy on medication refills

## 2018-05-08 NOTE — Telephone Encounter (Signed)
Copied from [place]. Topic: Inquiry >> May 07, 2018 10:16 AM Michele Meyer wrote: Reason for CRM: Patient also believes that she has a Bladder infection. Patient wants to know if Dr. Wynetta Emery would possibly prescribe a medication for this issue also.       Thank You!!!

## 2018-05-08 NOTE — Telephone Encounter (Signed)
Patient LVM that she needs to reschedule her colonoscopy. Please call

## 2018-05-08 NOTE — Telephone Encounter (Signed)
Pt is calling checking on status of refill. She is willing to come to the office to pick up script.  She is just checking due to waiting to leave to go help her daughter to move.

## 2018-05-08 NOTE — Telephone Encounter (Signed)
Returned patient's call to reschedule procedure date.  LVM for callback.

## 2018-05-09 ENCOUNTER — Telehealth: Payer: Self-pay

## 2018-05-09 ENCOUNTER — Other Ambulatory Visit: Payer: Self-pay

## 2018-05-09 NOTE — Telephone Encounter (Signed)
Patients call has been returned to reschedule her colonoscopy.  She states that she is in the middle of moving and would like to have it pushed out further.  I offered June 17th with [place], but she preferred a morning on that day.  I've rescheduled with Dr. Vicente Males for 06/09/18.  Notified Gail in Endo to make the change.  Will mail new instructions.

## 2018-05-09 NOTE — Telephone Encounter (Signed)
See other encounter.

## 2018-05-12 ENCOUNTER — Ambulatory Visit: Payer: Medicare Other | Admitting: Family Medicine

## 2018-05-12 ENCOUNTER — Telehealth: Payer: Self-pay | Admitting: Family Medicine

## 2018-05-12 DIAGNOSIS — N390 Urinary tract infection, site not specified: Secondary | ICD-10-CM | POA: Diagnosis not present

## 2018-05-12 NOTE — Telephone Encounter (Signed)
Copied from [place] [phone]. Topic: Quick Communication - See Telephone Encounter >> May 12, 2018 10:43 AM Cleaster Corin, NT wrote: CRM for notification. See Telephone encounter for: 05/12/18.  Pt. Calling to see if she can be worked in for an appt.(due to running late had to cancel appt) This week. She only wants to be seen by Dr. Wynetta Emery. Let pt. Know that someone will give her a call when available .

## 2018-05-12 NOTE — Telephone Encounter (Signed)
Copied from [place] [phone]. Topic: Quick Communication - Patient Running Late >> May 12, 2018 10:38 AM Cleaster Corin, NT wrote: Patient called and is running 12 minutes late.  Pt. Running late due to traffic will cancel appt.  Route to department's PEC pool.

## 2018-06-06 ENCOUNTER — Encounter: Payer: Self-pay | Admitting: *Deleted

## 2018-06-09 ENCOUNTER — Encounter
Admission: RE | Disposition: A | Discharge status: Home / Self Care | Payer: Self-pay | Source: Ambulatory Visit | Attending: Gastroenterology

## 2018-06-09 ENCOUNTER — Encounter: Payer: Self-pay | Admitting: *Deleted

## 2018-06-09 ENCOUNTER — Ambulatory Visit: Payer: Medicare Other | Admitting: Anesthesiology

## 2018-06-09 ENCOUNTER — Ambulatory Visit
Admission: RE | Admit: 2018-06-09 | Discharge: 2018-06-09 | Disposition: A | Discharge status: Home / Self Care | Payer: Medicare Other | Source: Ambulatory Visit | Attending: Gastroenterology | Admitting: Gastroenterology

## 2018-06-09 DIAGNOSIS — Z888 Allergy status to other drugs, medicaments and biological substances status: Secondary | ICD-10-CM | POA: Insufficient documentation

## 2018-06-09 DIAGNOSIS — Z91040 Latex allergy status: Secondary | ICD-10-CM | POA: Insufficient documentation

## 2018-06-09 DIAGNOSIS — D126 Benign neoplasm of colon, unspecified: Secondary | ICD-10-CM | POA: Diagnosis not present

## 2018-06-09 DIAGNOSIS — K635 Polyp of colon: Secondary | ICD-10-CM | POA: Diagnosis not present

## 2018-06-09 DIAGNOSIS — I739 Peripheral vascular disease, unspecified: Secondary | ICD-10-CM | POA: Diagnosis not present

## 2018-06-09 DIAGNOSIS — D123 Benign neoplasm of transverse colon: Secondary | ICD-10-CM | POA: Insufficient documentation

## 2018-06-09 DIAGNOSIS — Z880 Allergy status to penicillin: Secondary | ICD-10-CM | POA: Diagnosis not present

## 2018-06-09 DIAGNOSIS — R195 Other fecal abnormalities: Secondary | ICD-10-CM | POA: Diagnosis not present

## 2018-06-09 DIAGNOSIS — F329 Major depressive disorder, single episode, unspecified: Secondary | ICD-10-CM | POA: Diagnosis not present

## 2018-06-09 DIAGNOSIS — Z85828 Personal history of other malignant neoplasm of skin: Secondary | ICD-10-CM | POA: Insufficient documentation

## 2018-06-09 DIAGNOSIS — Z87891 Personal history of nicotine dependence: Secondary | ICD-10-CM | POA: Insufficient documentation

## 2018-06-09 DIAGNOSIS — Z885 Allergy status to narcotic agent status: Secondary | ICD-10-CM | POA: Diagnosis not present

## 2018-06-09 DIAGNOSIS — Z881 Allergy status to other antibiotic agents status: Secondary | ICD-10-CM | POA: Insufficient documentation

## 2018-06-09 DIAGNOSIS — D122 Benign neoplasm of ascending colon: Secondary | ICD-10-CM | POA: Diagnosis not present

## 2018-06-09 DIAGNOSIS — K5730 Diverticulosis of large intestine without perforation or abscess without bleeding: Secondary | ICD-10-CM | POA: Diagnosis not present

## 2018-06-09 DIAGNOSIS — K5790 Diverticulosis of intestine, part unspecified, without perforation or abscess without bleeding: Secondary | ICD-10-CM | POA: Diagnosis not present

## 2018-06-09 DIAGNOSIS — K219 Gastro-esophageal reflux disease without esophagitis: Secondary | ICD-10-CM | POA: Insufficient documentation

## 2018-06-09 DIAGNOSIS — F419 Anxiety disorder, unspecified: Secondary | ICD-10-CM | POA: Insufficient documentation

## 2018-06-09 DIAGNOSIS — Z79899 Other long term (current) drug therapy: Secondary | ICD-10-CM | POA: Diagnosis not present

## 2018-06-09 DIAGNOSIS — K640 First degree hemorrhoids: Secondary | ICD-10-CM | POA: Diagnosis not present

## 2018-06-09 HISTORY — PX: COLONOSCOPY WITH PROPOFOL: SHX5780

## 2018-06-09 SURGERY — COLONOSCOPY WITH PROPOFOL
Anesthesia: General

## 2018-06-09 MED ORDER — SODIUM CHLORIDE 0.9 % IV SOLN
INTRAVENOUS | Status: DC
  Administered 2018-06-09: 09:00:00 via INTRAVENOUS

## 2018-06-09 MED ORDER — PROPOFOL 500 MG/50ML IV EMUL
INTRAVENOUS | Status: AC
  Filled 2018-06-09: qty 50

## 2018-06-09 MED ORDER — PROPOFOL 500 MG/50ML IV EMUL
INTRAVENOUS | Status: DC | PRN
  Administered 2018-06-09: 175 ug/kg/min via INTRAVENOUS

## 2018-06-09 MED ORDER — PROPOFOL 10 MG/ML IV BOLUS
INTRAVENOUS | Status: DC | PRN
  Administered 2018-06-09: 80 mg via INTRAVENOUS
  Administered 2018-06-09: 20 mg via INTRAVENOUS

## 2018-06-09 NOTE — Transfer of Care (Signed)
Immediate Anesthesia Transfer of Care Note  Patient: KEM PARCHER  Procedure(s) Performed: COLONOSCOPY WITH PROPOFOL (N/A )  Patient Location: PACU  Anesthesia Type:General  Level of Consciousness: sedated  Airway & Oxygen Therapy: Patient Spontanous Breathing and Patient connected to nasal cannula oxygen  Post-op Assessment: Report given to RN and Post -op Vital signs reviewed and stable  Post vital signs: Reviewed and stable  Last Vitals:  Vitals Value Taken Time  BP 116/57 06/09/2018  9:32 AM  Temp 36.2 C 06/09/2018  9:30 AM  Pulse 76 06/09/2018  9:33 AM  Resp 13 06/09/2018  9:33 AM  SpO2 100 % 06/09/2018  9:33 AM  Vitals shown include unvalidated device data.  Last Pain:  Vitals:   06/09/18 0930  TempSrc: Tympanic         Complications: No apparent anesthesia complications

## 2018-06-09 NOTE — Anesthesia Procedure Notes (Signed)
Date/Time: 06/09/2018 9:05 AM Performed by: Nelda Marseille, CRNA Pre-anesthesia Checklist: Patient identified, Emergency Drugs available, Suction available, Patient being monitored and Timeout performed Oxygen Delivery Method: Nasal cannula

## 2018-06-09 NOTE — Anesthesia Postprocedure Evaluation (Signed)
Anesthesia Post Note  Patient: Michele Meyer  Procedure(s) Performed: COLONOSCOPY WITH PROPOFOL (N/A )  Patient location during evaluation: Endoscopy Anesthesia Type: General Level of consciousness: awake and alert Pain management: pain level controlled Vital Signs Assessment: post-procedure vital signs reviewed and stable Respiratory status: spontaneous breathing, nonlabored ventilation, respiratory function stable and patient connected to nasal cannula oxygen Cardiovascular status: blood pressure returned to baseline and stable Postop Assessment: no apparent nausea or vomiting Anesthetic complications: no     Last Vitals:  Vitals:   06/09/18 0946 06/09/18 1000  BP: 140/82 (!) 135/96  Pulse:    Resp:    Temp:    SpO2:      Last Pain:  Vitals:   06/09/18 0930  TempSrc: Tympanic                 Martha Clan

## 2018-06-09 NOTE — Anesthesia Post-op Follow-up Note (Signed)
Anesthesia QCDR form completed.        

## 2018-06-09 NOTE — Anesthesia Preprocedure Evaluation (Addendum)
Anesthesia Evaluation  Patient identified by MRN, date of birth, ID band Patient awake    Reviewed: Allergy & Precautions, H&P , NPO status , Patient's Chart, lab work & pertinent test results, reviewed documented beta blocker date and time   History of Anesthesia Complications (+) PONV and history of anesthetic complications  Airway Mallampati: III  TM Distance: >3 FB Neck ROM: full    Dental  (+) Dental Advidsory Given, Teeth Intact Bridge x 2, removable:   Pulmonary neg pulmonary ROS, former smoker,           Cardiovascular Exercise Tolerance: Good hypertension, (-) angina+ Peripheral Vascular Disease  (-) CAD, (-) Past MI, (-) Cardiac Stents and (-) CABG (-) dysrhythmias (-) Valvular Problems/Murmurs     Neuro/Psych PSYCHIATRIC DISORDERS Anxiety Depression negative neurological ROS     GI/Hepatic Neg liver ROS, GERD  ,  Endo/Other  negative endocrine ROS  Renal/GU Renal disease  negative genitourinary   Musculoskeletal   Abdominal   Peds  Hematology negative hematology ROS (+)   Anesthesia Other Findings Past Medical History: No date: Anxiety No date: Arthritis No date: Bladder filling defect     Comment:  bladder sling protrusing last 3 years  No date: Chronic cystitis No date: Cyst of right kidney No date: Cystocele No date: Depression No date: Diverticulosis No date: Dyslipidemia No date: Endometriosis 30 years ago: Gestational diabetes mellitus     Comment:  with pregnancy  No date: Glaucoma     Comment:  both eyes No date: Incomplete bladder emptying No date: Labile hypertension No date: PONV (postoperative nausea and vomiting) No date: Skin cancer No date: Skin cancer     Comment:  basal and squamous cell No date: Stress incontinence No date: Uterovaginal prolapse, incomplete No date: UTI (lower urinary tract infection)   Reproductive/Obstetrics negative OB ROS                             Anesthesia Physical Anesthesia Plan  ASA: II  Anesthesia Plan: General   Post-op Pain Management:    Induction: Intravenous  PONV Risk Score and Plan: 4 or greater and Propofol infusion  Airway Management Planned: Nasal Cannula  Additional Equipment:   Intra-op Plan:   Post-operative Plan:   Informed Consent: I have reviewed the patients History and Physical, chart, labs and discussed the procedure including the risks, benefits and alternatives for the proposed anesthesia with the patient or authorized representative who has indicated his/her understanding and acceptance.   Dental Advisory Given  Plan Discussed with: Anesthesiologist, CRNA and Surgeon  Anesthesia Plan Comments:        Anesthesia Quick Evaluation

## 2018-06-09 NOTE — H&P (Signed)
Michele Bellows, MD [address], [place], [place], [place], [address] [address], [place], [place], [place], [address] Phone: [phone]  Fax: [phone]  Primary Care Physician:  Michele Roys, DO   Pre-Procedure History & Physical: HPI:  Michele Meyer is a 72 y.o. female is here for an colonoscopy.   Past Medical History:  Diagnosis Date  . Anxiety   . Arthritis   . Bladder filling defect    bladder sling protrusing last 3 years   . Chronic cystitis   . Cyst of right kidney   . Cystocele   . Depression   . Diverticulosis   . Dyslipidemia   . Endometriosis   . Gestational diabetes mellitus 30 years ago   with pregnancy   . Glaucoma    both eyes  . Incomplete bladder emptying   . Labile hypertension   . PONV (postoperative nausea and vomiting)   . Skin cancer   . Skin cancer    basal and squamous cell  . Stress incontinence   . Uterovaginal prolapse, incomplete   . UTI (lower urinary tract infection)     Past Surgical History:  Procedure Laterality Date  . ABDOMINAL HYSTERECTOMY     complete  . ANTERIOR AND POSTERIOR VAGINAL REPAIR    . APPENDECTOMY    . BREAST BIOPSY    . BREAST LUMPECTOMY     benign  . prolapsed bladder     Repair Dr.Cope  . PUBOVAGINAL SLING  4 years ago   protrusion of bladder sling for last 3 years  . removal of first rib     bilaterally  . TONSILLECTOMY    . TOTAL KNEE ARTHROPLASTY Right 10/03/2015   Procedure: RIGHT TOTAL KNEE ARTHROPLASTY;  Surgeon: Michele Arabian, MD;  Location: [facility];  Service: Orthopedics;  Laterality: Right;  . VEIN LIGATION AND STRIPPING Bilateral     Prior to Admission medications   Medication Sig Start Date End Date Taking? Authorizing Provider  ALPRAZolam (XANAX) 0.25 MG tablet Take 1 tablet (0.25 mg total) by mouth 2 (two) times daily as needed for anxiety. 05/08/18  Yes Meyer, Michele P, DO  calcium carbonate (CALCIUM 600) 600 MG TABS tablet Take 1,200 mg by mouth 2 (two) times daily  with a meal.   Yes [provider]  latanoprost (XALATAN) 0.005 % ophthalmic solution Place 1 drop into both eyes at bedtime.  03/10/13  Yes [provider]  metoprolol succinate (TOPROL-XL) 25 MG 24 hr tablet Take 0.5 tablets (12.5 mg total) by mouth daily. 04/09/18  Yes Meyer, Michele P, DO  sertraline (ZOLOFT) 50 MG tablet Take 1.5 tablets (75 mg total) by mouth daily. 03/10/18  Yes Meyer, Michele P, DO  traZODone (DESYREL) 50 MG tablet Take 0.5 tablets (25 mg total) by mouth at bedtime. 04/09/18  Yes Meyer, Michele P, DO  valsartan-hydrochlorothiazide (DIOVAN-HCT) 160-25 MG tablet Take 1 tablet by mouth daily. 04/09/18  Yes Meyer, Michele P, DO  cholecalciferol (VITAMIN D) 1000 units tablet Take 1,000 Units by mouth daily.    [provider]  nystatin (MYCOSTATIN/NYSTOP) powder APPLY TO AFFECTED AREA 3 TIMES DAILY 01/15/18   Michele American, PA-C  pantoprazole (PROTONIX) 20 MG tablet Take 2 tablets (40 mg total) by mouth daily. 04/09/18   Meyer, Michele P, DO  venlafaxine XR (EFFEXOR XR) 75 MG 24 hr capsule Take 1 capsule (75 mg total) by mouth daily with breakfast. Patient not taking: Reported on 06/09/2018 04/09/18  Meyer, Michele P, DO    Allergies as of 05/08/2018 - Review Complete 04/09/2018  Allergen Reaction Noted  . Ace inhibitors  06/12/2013  . Codeine Nausea And Vomiting 10/24/2011  . Erythromycin Diarrhea 12/05/2011  . Fosamax [alendronate] Other (See Comments) 02/18/2017  . Hydroxychloroquine  05/28/2017  . Prednisone Other (See Comments) 12/14/2011  . Reclast [zoledronic acid] Hives 06/17/2017  . Latex  10/27/2013  . Penicillins Rash 06/30/2015    Family History  Problem Relation Age of Onset  . Diabetes Mother   . Hypertension Mother   . Cervical cancer Mother   . Skin cancer Mother   . Hypercholesterolemia Mother   . Heart disease Mother   . Kidney disease Mother   . Depression Father   . Hypertension Father   . Hypercholesterolemia  Father   . Colon cancer Neg Hx     Social History   Socioeconomic History  . Marital status: Married    Spouse name: Not on file  . Number of children: 5  . Years of education: Not on file  . Highest education level: Not on file  Occupational History    Employer: retired  Scientific laboratory technician  . Financial resource strain: Not very hard  . Food insecurity:    Worry: Never true    Inability: Never true  . Transportation needs:    Medical: No    Non-medical: No  Tobacco Use  . Smoking status: Former Smoker    Last attempt to quit: 02/25/1964    Years since quitting: 54.3  . Smokeless tobacco: Never Used  . Tobacco comment: smoked for two months socially   Substance and Sexual Activity  . Alcohol use: Never    Frequency: Never  . Drug use: No  . Sexual activity: Not Currently  Lifestyle  . Physical activity:    Days per week: 0 days    Minutes per session: 0 min  . Stress: Not at all  Relationships  . Social connections:    Talks on phone: More than three times a week    Gets together: More than three times a week    Attends religious service: More than 4 times per year    Active member of club or organization: Yes    Attends meetings of clubs or organizations: More than 4 times per year    Relationship status: Married  . Intimate partner violence:    Fear of current or ex partner: No    Emotionally abused: No    Physically abused: No    Forced sexual activity: No  Other Topics Concern  . Not on file  Social History Narrative   Married, mother of 2, with at least one granddaughter who is present today.   Daily Caffeine Use:  2 cups in am;   She does not exercise routinely. Former smoker who quit in 1965.   Takes occasional alcohol beverage.   As the name is [place] granddaughter's name is Michele Meyer    Review of Systems: See HPI, otherwise negative ROS  Physical Exam: BP (!) 182/77   Pulse (!) 103   Temp (!) 97.1 F (36.2 C) (Tympanic)   Resp 16   Ht 5\' 2"   (1.575 m)   Wt 141 lb (64 kg)   SpO2 100%   BMI 25.79 kg/m  General:   Alert,  pleasant and cooperative in NAD Head:  Normocephalic and atraumatic. Neck:  Supple; no masses or thyromegaly. Lungs:  Clear throughout to auscultation, normal respiratory effort.  Heart:  +S1, +S2, Regular rate and rhythm, No edema. Abdomen:  Soft, nontender and nondistended. Normal bowel sounds, without guarding, and without rebound.   Neurologic:  Alert and  oriented x4;  grossly normal neurologically.  Impression/Plan: Michele Meyer is here for an colonoscopy to be performed for positive cologuard  Risks, benefits, limitations, and alternatives regarding  colonoscopy have been reviewed with the patient.  Questions have been answered.  All parties agreeable.   Michele Bellows, MD  06/09/2018, 8:40 AM

## 2018-06-09 NOTE — Op Note (Signed)
[facility] Gastroenterology Patient Name: Michele Meyer Procedure Date: 06/09/2018 8:28 AM MRN: [id number] Account #: [id number] Date of Birth: 1946-10-26 Admit Type: Outpatient Age: 72 Room: [facility] ENDO ROOM 4 Gender: Female Note Status: Finalized Procedure:            Colonoscopy Indications:          Positive Cologuard test Providers:            [name] MD, MD Medicines:            Monitored Anesthesia Care Complications:        No immediate complications. Procedure:            Pre-Anesthesia Assessment:                       - Prior to the procedure, a History and Physical was                        performed, and patient medications, allergies and                        sensitivities were reviewed. The patient's tolerance of                        previous anesthesia was reviewed.                       - The risks and benefits of the procedure and the                        sedation options and risks were discussed with the                        patient. All questions were answered and informed                        consent was obtained.                       - ASA Grade Assessment: II - A patient with mild                        systemic disease.                       After obtaining informed consent, the colonoscope was                        passed under direct vision. Throughout the procedure,                        the patient's blood pressure, pulse, and oxygen                        saturations were monitored continuously. The                        Colonoscope was introduced through the anus and                        advanced to the the cecum, identified by the  appendiceal orifice, IC valve and transillumination.                        The colonoscopy was performed with ease. The patient                        tolerated the procedure well. The quality of the bowel                        preparation was good. Findings:    The perianal and digital rectal examinations were normal.      Multiple small-mouthed diverticula were found in the sigmoid colon.      Two sessile polyps were found in the transverse colon and ascending       colon. The polyps were 5 to 8 mm in size. These polyps were removed with       a cold snare. Resection and retrieval were complete.      Three sessile polyps were found in the ascending colon. The polyps were       3 to 5 mm in size. These polyps were removed with a cold biopsy forceps.       Resection and retrieval were complete.      Non-bleeding internal hemorrhoids were found during retroflexion. The       hemorrhoids were medium-sized and Grade I (internal hemorrhoids that do       not prolapse).      The exam was otherwise without abnormality on direct and retroflexion       views. Impression:           - Diverticulosis in the sigmoid colon.                       - Two 5 to 8 mm polyps in the transverse colon and in                        the ascending colon, removed with a cold snare.                        Resected and retrieved.                       - Three 3 to 5 mm polyps in the ascending colon,                        removed with a cold biopsy forceps. Resected and                        retrieved.                       - Non-bleeding internal hemorrhoids.                       - The examination was otherwise normal on direct and                        retroflexion views. Recommendation:       - Discharge patient to home (with escort).                       - Resume previous diet.                       -  Continue present medications.                       - Await pathology results.                       - Repeat colonoscopy in 3 years for surveillance. Procedure Code(s):    --- Professional ---                       [phone], Colonoscopy, flexible; with removal of tumor(s),                        polyp(s), or other lesion(s) by snare technique                        45380, 44, Colonoscopy, flexible; with biopsy, single                        or multiple Diagnosis Code(s):    --- Professional ---                       K64.0, First degree hemorrhoids                       D12.3, Benign neoplasm of transverse colon (hepatic                        flexure or splenic flexure)                       D12.2, Benign neoplasm of ascending colon                       R19.5, Other fecal abnormalities                       K57.30, Diverticulosis of large intestine without                        perforation or abscess without bleeding CPT copyright 2017 American Medical Association. All rights reserved. The codes documented in this report are preliminary and upon coder review may  be revised to meet current compliance requirements. [name], MD [name] MD, MD 06/09/2018 9:31:52 AM This report has been signed electronically. Number of Addenda: 0 Note Initiated On: 06/09/2018 8:28 AM Scope Withdrawal Time: 0 hours 18 minutes 15 seconds  Total Procedure Duration: 0 hours 27 minutes 0 seconds       [facility]

## 2018-06-10 ENCOUNTER — Other Ambulatory Visit: Payer: Self-pay | Admitting: Family Medicine

## 2018-06-11 ENCOUNTER — Encounter: Payer: Self-pay | Admitting: Gastroenterology

## 2018-06-11 LAB — SURGICAL PATHOLOGY

## 2018-06-11 NOTE — Telephone Encounter (Signed)
Pt called in to follow up on refill request for: Alprazolam and Metoprolol. Please assist further.   Pharmacy:  [place], [place] [phone] (Phone) [phone] (Fax)

## 2018-06-11 NOTE — Telephone Encounter (Signed)
Cancelled last appointment. Needs follow up

## 2018-06-11 NOTE — Telephone Encounter (Signed)
Pt. Is waiting for medication in order to leave.   Just asking if could get this prescriptions today

## 2018-06-11 NOTE — Telephone Encounter (Signed)
Pt calling back to check status on med refills. She is completely out of both medications.

## 2018-06-12 NOTE — Telephone Encounter (Signed)
Patient called and advised she will need an appointment for follow up. Patient says "I have been out of my medication since Saturday and really need it today. Does she have an appointment today?" I advised she is not available today, but has an opening tomorrow at 1115, she says "go ahead and schedule that appointment and I will be in to see her. In the meantime, can I get my medications refilled today?" I advised I will send her request to Dr. Wynetta Emery.  Last OV:04/09/18 Last refill:Xanax 05/08/18 45 tab/0 refill; Metoprolol 04/09/18 45 tab/1 refill LHT:[id number] Pharmacy: [place], [place] [phone] (Phone) [phone] (Fax)

## 2018-06-13 ENCOUNTER — Ambulatory Visit: Payer: Medicare Other | Admitting: Family Medicine

## 2018-06-13 ENCOUNTER — Encounter: Payer: Self-pay | Admitting: Family Medicine

## 2018-06-13 VITALS — BP 138/54 | HR 87 | Temp 98.5°F | Wt 143.1 lb

## 2018-06-13 DIAGNOSIS — I10 Essential (primary) hypertension: Secondary | ICD-10-CM | POA: Diagnosis not present

## 2018-06-13 DIAGNOSIS — F411 Generalized anxiety disorder: Secondary | ICD-10-CM | POA: Diagnosis not present

## 2018-06-13 MED ORDER — SERTRALINE HCL 50 MG PO TABS: 75.0000 mg | ORAL_TABLET | Freq: Every day | ORAL | 3 refills | Status: DC

## 2018-06-13 MED ORDER — ALENDRONATE SODIUM 70 MG PO TABS: 70.0000 mg | ORAL_TABLET | ORAL | 11 refills | Status: DC

## 2018-06-13 MED ORDER — ALPRAZOLAM 0.25 MG PO TABS: ORAL_TABLET | ORAL | 2 refills | Status: DC

## 2018-06-13 NOTE — Assessment & Plan Note (Signed)
Stable on the sertaline. Does not think it's causing her side effects. Will continue current regimen including PRN xanax and recheck 3 month. Rx for 3 months sent through to her pharmacy.

## 2018-06-13 NOTE — Assessment & Plan Note (Signed)
Better on recheck. Continue current regimen. Continue to monitor. Call with any concerns.  

## 2018-06-13 NOTE — Progress Notes (Addendum)
BP (!) 138/54 (BP Location: Right Arm, Cuff Size: Normal)   Pulse 87   Temp 98.5 F (36.9 C) (Oral)   Wt 143 lb 1.6 oz (64.9 kg)   SpO2 98%   BMI 26.17 kg/m    Subjective:    Patient ID: Michele Meyer, female    DOB: 03-03-1946, 72 y.o.   MRN: [id number]  HPI: Michele Meyer is a 72 y.o. female  Chief Complaint  Patient presents with  . Depression  . Hypertension   ANXIETY/STRESS- hasn't changed to the effexor, seems to be doing OK with the zoloft. Feels like there is still a lot going on. Still having a lot of issues with her son. Still dealing with her husband who lost her job.  Duration:stable Anxious mood: yes  Excessive worrying: yes Irritability: no  Sweating: no Nausea: no Palpitations:no Hyperventilation: no Panic attacks: yes Agoraphobia: no  Obscessions/compulsions: no Depressed mood: yes Depression screen [facility] 2/9 06/13/2018 04/09/2018 04/07/2018 01/20/2018 10/12/2016  Decreased Interest 0 0 0 1 0  Down, Depressed, Hopeless 1 1 0 0 0  PHQ - 2 Score 1 1 0 1 0  Altered sleeping 0 0 - 0 -  Tired, decreased energy 1 2 - 0 -  Change in appetite 0 0 - 0 -  Feeling bad or failure about yourself  0 0 - 0 -  Trouble concentrating 0 0 - 0 -  Moving slowly or fidgety/restless 0 0 - 0 -  Suicidal thoughts 0 0 - 0 -  PHQ-9 Score 2 3 - 1 -  Difficult doing work/chores Not difficult at all Somewhat difficult - - -  Some recent data might be hidden   GAD 7 : Generalized Anxiety Score 06/13/2018 04/09/2018  Nervous, Anxious, on Edge 1 2  Control/stop worrying 3 3  Worry too much - different things 1 2  Trouble relaxing 0 0  Restless 0 2  Easily annoyed or irritable 0 0  Afraid - awful might happen 0 3  Total GAD 7 Score 5 12  Anxiety Difficulty Not difficult at all Somewhat difficult   Anhedonia: no Weight changes: no Insomnia: yes   Hypersomnia: no Fatigue/loss of energy: yes Feelings of worthlessness: no Feelings of guilt: yes Impaired  concentration/indecisiveness: no Suicidal ideations: no  Crying spells: no Recent Stressors/Life Changes: yes   Relationship problems: yes   Family stress: yes     Financial stress: yes    Job stress: yes    Recent death/loss: no  HYPERTENSION Hypertension status: controlled  Satisfied with current treatment? yes Duration of hypertension: chronic BP monitoring frequency:  not checking BP medication side effects:  no Medication compliance: excellent compliance Aspirin: no Recurrent headaches: no Visual changes: no Palpitations: no Dyspnea: no Chest pain: no Lower extremity edema: no Dizzy/lightheaded: no   Relevant past medical, surgical, family and social history reviewed and updated as indicated. Interim medical history since our last visit reviewed. Allergies and medications reviewed and updated.  Review of Systems  Constitutional: Negative.   Respiratory: Negative.   Cardiovascular: Negative.   Genitourinary: Negative.   Psychiatric/Behavioral: Positive for dysphoric mood. Negative for agitation, behavioral problems, confusion, decreased concentration, hallucinations, self-injury, sleep disturbance and suicidal ideas. The patient is nervous/anxious. The patient is not hyperactive.     Per HPI unless specifically indicated above     Objective:    BP (!) 138/54 (BP Location: Right Arm, Cuff Size: Normal)   Pulse 87   Temp 98.5 F (36.9 C) (  Oral)   Wt 143 lb 1.6 oz (64.9 kg)   SpO2 98%   BMI 26.17 kg/m   Wt Readings from Last 3 Encounters:  06/13/18 143 lb 1.6 oz (64.9 kg)  06/09/18 141 lb (64 kg)  04/09/18 144 lb 2 oz (65.4 kg)    Physical Exam  Constitutional: She is oriented to person, place, and time. She appears well-developed and well-nourished. No distress.  HENT:  Head: Normocephalic and atraumatic.  Right Ear: Hearing normal.  Left Ear: Hearing normal.  Nose: Nose normal.  Eyes: Conjunctivae and lids are normal. Right eye exhibits no discharge.  Left eye exhibits no discharge. No scleral icterus.  Cardiovascular: Normal rate, regular rhythm, normal heart sounds and intact distal pulses. Exam reveals no gallop and no friction rub.  No murmur heard. Pulmonary/Chest: Effort normal and breath sounds normal. No stridor. No respiratory distress. She has no wheezes. She has no rales. She exhibits no tenderness.  Musculoskeletal: Normal range of motion.  Neurological: She is alert and oriented to person, place, and time.  Skin: Skin is warm, dry and intact. Capillary refill takes less than 2 seconds. No rash noted. She is not diaphoretic. No erythema. No pallor.  Psychiatric: She has a normal mood and affect. Her speech is normal and behavior is normal. Judgment and thought content normal. Cognition and memory are normal.  Nursing note and vitals reviewed.   Results for orders placed or performed during the hospital encounter of 06/09/18  Surgical pathology  Result Value Ref Range   SURGICAL PATHOLOGY      Surgical Pathology CASE: [id number] PATIENT: Michele Meyer Surgical Pathology Report     SPECIMEN SUBMITTED: A. Colon polyp, transverse; cold snare B. Colon polyp x 4, ascending; cold snare and cbx  CLINICAL HISTORY: None provided  PRE-OPERATIVE DIAGNOSIS: Positive cologuard, R19.5  POST-OPERATIVE DIAGNOSIS: Diverticulosis; colon polyps     DIAGNOSIS: A. COLON POLYP, TRANSVERSE; COLD SNARE: - TUBULAR ADENOMA. - FECAL MATERIAL. - NEGATIVE FOR HIGH-GRADE DYSPLASIA AND MALIGNANCY. - DEEPER SECTIONS EXAMINED.  B.  COLON POLYP X4, ASCENDING; COLD SNARE X1 AND COLD BIOPSY X3: - TUBULAR ADENOMA (2). - COLONIC MUCOSA WITH PROMINENT LYMPHOID AGGREGATE (1). - HYPERPLASTIC POLYP (1). - FECAL MATERIAL. - NEGATIVE FOR HIGH-GRADE DYSPLASIA AND MALIGNANCY. - DEEPER SECTIONS EXAMINED.   GROSS DESCRIPTION: A. Labeled: Cold snare polyp transverse colon Received: In formalin Tissue fragment(s): Multiple Size: Aggregate,  2.0 x 0.3 x 0.1 cm Description: Pink f ragments and green fecal material Entirely submitted in one cassette.  B. Labeled: Cold snare polyp ascending colon x1, cbx polyp ascending colon x3 Received: In formalin Tissue fragment(s): Multiple Size: Aggregate, 2.5 x 2.3 x 0.2 cm Description: Green fecal material and pink tissue fragments Entirely submitted in one cassette.   Final Diagnosis performed by Quay Burow, MD.   Electronically signed 06/11/2018 9:09:54AM The electronic signature indicates that the named Attending Pathologist has evaluated the specimen  Technical component performed at [facility], [address] [place], [place] [address] Lab: [phone] Dir: Rush Farmer, MD, MMM  Professional component performed at [facility], [facility], [place], [place], [place] [address] Lab: [phone] Dir: Dellia Nims. Reuel Derby, MD       Assessment & Plan:   Problem List Items Addressed This Visit      Cardiovascular and Mediastinum   Hypertension (Chronic)    Better on recheck. Continue current regimen. Continue to monitor. Call with any concerns.         Other   Generalized anxiety  disorder - Primary    Stable on the sertaline. Does not think it's causing her side effects. Will continue current regimen including PRN xanax and recheck 3 month. Rx for 3 months sent through to her pharmacy.      Relevant Medications   ALPRAZolam (XANAX) 0.25 MG tablet   sertraline (ZOLOFT) 50 MG tablet       Follow up plan: Return in about 3 months (around 09/13/2018).

## 2018-06-15 ENCOUNTER — Encounter: Payer: Self-pay | Admitting: Gastroenterology

## 2018-06-16 ENCOUNTER — Other Ambulatory Visit: Payer: Self-pay

## 2018-08-05 DIAGNOSIS — M1712 Unilateral primary osteoarthritis, left knee: Secondary | ICD-10-CM | POA: Diagnosis not present

## 2018-08-26 ENCOUNTER — Telehealth: Payer: Self-pay | Admitting: Family Medicine

## 2018-08-26 NOTE — Telephone Encounter (Signed)
Called and spoke with patient. She is moving to [place]. She wanted to make sure she would be able to continue to get her Xanax. Advised patient that she would still have to been seen for provider to continue to prescribe medication. Patient was scheduled to come in on Friday for follow up before she moves.

## 2018-08-26 NOTE — Telephone Encounter (Signed)
Copied from [place] [phone]. Topic: General - Other >> Aug 26, 2018 11:22 AM Ivar Drape wrote: Reason for CRM:   Patient would like to talk to Dr. Wynetta Emery assist about her medication.

## 2018-08-26 NOTE — Telephone Encounter (Signed)
Left message on machine for pt to return call to the office. CRM updated.  

## 2018-08-26 NOTE — Telephone Encounter (Signed)
Please find out what she needs.

## 2018-08-26 NOTE — Telephone Encounter (Signed)
She will need to be seen at least every 3 months. She should really establish with a provider out there if she is living out there.

## 2018-08-29 ENCOUNTER — Ambulatory Visit: Payer: Medicare Other | Admitting: Family Medicine

## 2018-08-29 ENCOUNTER — Other Ambulatory Visit: Payer: Self-pay

## 2018-08-29 ENCOUNTER — Encounter: Payer: Self-pay | Admitting: Family Medicine

## 2018-08-29 ENCOUNTER — Other Ambulatory Visit: Payer: Self-pay | Admitting: Family Medicine

## 2018-08-29 VITALS — BP 139/77 | HR 64 | Temp 98.5°F | Wt 141.0 lb

## 2018-08-29 DIAGNOSIS — F3341 Major depressive disorder, recurrent, in partial remission: Secondary | ICD-10-CM

## 2018-08-29 DIAGNOSIS — I10 Essential (primary) hypertension: Secondary | ICD-10-CM | POA: Diagnosis not present

## 2018-08-29 DIAGNOSIS — R768 Other specified abnormal immunological findings in serum: Secondary | ICD-10-CM

## 2018-08-29 DIAGNOSIS — E782 Mixed hyperlipidemia: Secondary | ICD-10-CM

## 2018-08-29 DIAGNOSIS — K219 Gastro-esophageal reflux disease without esophagitis: Secondary | ICD-10-CM | POA: Diagnosis not present

## 2018-08-29 LAB — MICROALBUMIN, URINE WAIVED
Creatinine, Urine Waived: 200 mg/dL (ref 10–300)
Microalb, Ur Waived: 30 mg/L — ABNORMAL HIGH (ref 0–19)

## 2018-08-29 MED ORDER — PANTOPRAZOLE SODIUM 20 MG PO TBEC: 40.0000 mg | DELAYED_RELEASE_TABLET | Freq: Every day | ORAL | 3 refills | Status: AC

## 2018-08-29 MED ORDER — ALPRAZOLAM 0.25 MG PO TABS: ORAL_TABLET | ORAL | 2 refills | Status: AC

## 2018-08-29 MED ORDER — ALENDRONATE SODIUM 70 MG PO TABS: 70.0000 mg | ORAL_TABLET | ORAL | 11 refills | Status: AC

## 2018-08-29 MED ORDER — METOPROLOL SUCCINATE ER 25 MG PO TB24: 12.5000 mg | ORAL_TABLET | Freq: Every day | ORAL | 3 refills | Status: AC

## 2018-08-29 MED ORDER — TRAZODONE HCL 50 MG PO TABS: 25.0000 mg | ORAL_TABLET | Freq: Every day | ORAL | 3 refills | Status: AC

## 2018-08-29 MED ORDER — SERTRALINE HCL 50 MG PO TABS: 75.0000 mg | ORAL_TABLET | Freq: Every day | ORAL | 3 refills | Status: AC

## 2018-08-29 MED ORDER — VALSARTAN-HYDROCHLOROTHIAZIDE 160-25 MG PO TABS: 1.0000 | ORAL_TABLET | Freq: Every day | ORAL | 3 refills | Status: AC

## 2018-08-29 NOTE — Progress Notes (Signed)
BP 139/77   Pulse 64   Temp 98.5 F (36.9 C) (Oral)   Wt 141 lb (64 kg)   SpO2 100%   BMI 25.79 kg/m    Subjective:    Patient ID: Michele Meyer, female    DOB: 12-07-1946, 72 y.o.   MRN: [id number]  HPI: Michele Meyer is a 72 y.o. female  Chief Complaint  Patient presents with  . Hypertension    3 m f/u med refills  . Anxiety   [place] to family. She is planning on establishing with a new provider once she's out there.  HYPERTENSION Hypertension status: uncontrolled  Satisfied with current treatment? yes Duration of hypertension: chronic BP monitoring frequency:  not checking BP medication side effects:  no Medication compliance: excellent compliance Previous BP meds:  Aspirin: no Recurrent headaches: no Visual changes: no Palpitations: no Dyspnea: no Chest pain: no Lower extremity edema: no Dizzy/lightheaded: yes  ANXIETY/STRESS Duration:stable Anxious mood: yes  Excessive worrying: yes Irritability: yes  Sweating: no Nausea: no Palpitations:yes Hyperventilation: no Panic attacks: yes Agoraphobia: no  Obscessions/compulsions: no Depressed mood: yes Depression screen [facility] 2/9 08/29/2018 06/13/2018 04/09/2018 04/07/2018 01/20/2018  Decreased Interest 0 0 0 0 1  Down, Depressed, Hopeless 0 1 1 0 0  PHQ - 2 Score 0 1 1 0 1  Altered sleeping 2 0 0 - 0  Tired, decreased energy 0 1 2 - 0  Change in appetite 0 0 0 - 0  Feeling bad or failure about yourself  0 0 0 - 0  Trouble concentrating 0 0 0 - 0  Moving slowly or fidgety/restless 0 0 0 - 0  Suicidal thoughts 0 0 0 - 0  PHQ-9 Score 2 2 3  - 1  Difficult doing work/chores Not difficult at all Not difficult at all Somewhat difficult - -  Some recent data might be hidden   GAD 7 : Generalized Anxiety Score 08/29/2018 06/13/2018 04/09/2018  Nervous, Anxious, on Edge 0 1 2  Control/stop worrying 0 3 3  Worry too much - different things 0 1 2    Trouble relaxing 0 0 0  Restless 0 0 2  Easily annoyed or irritable 0 0 0  Afraid - awful might happen 0 0 3  Total GAD 7 Score 0 5 12  Anxiety Difficulty Not difficult at all Not difficult at all Somewhat difficult   Anhedonia: no Weight changes: no Insomnia: no   Hypersomnia: no Fatigue/loss of energy: yes Feelings of worthlessness: no Feelings of guilt: no Impaired concentration/indecisiveness: no Suicidal ideations: no  Crying spells: no Recent Stressors/Life Changes: yes   Relationship problems: no   Family stress: yes     Financial stress: yes    Job stress: no    Recent death/loss: no  Relevant past medical, surgical, family and social history reviewed and updated as indicated. Interim medical history since our last visit reviewed. Allergies and medications reviewed and updated.  Review of Systems  Constitutional: Negative.   Respiratory: Negative.   Cardiovascular: Negative.   Neurological: Positive for dizziness and light-headedness. Negative for tremors, seizures, syncope, facial asymmetry, speech difficulty, weakness, numbness and headaches.  Psychiatric/Behavioral: Negative for agitation, behavioral problems, confusion, decreased concentration, dysphoric mood, hallucinations, self-injury, sleep disturbance and suicidal ideas. The patient is nervous/anxious. The patient is not hyperactive.     Per HPI unless specifically indicated above     Objective:  BP 139/77   Pulse 64   Temp 98.5 F (36.9 C) (Oral)   Wt 141 lb (64 kg)   SpO2 100%   BMI 25.79 kg/m   Wt Readings from Last 3 Encounters:  08/29/18 141 lb (64 kg)  06/13/18 143 lb 1.6 oz (64.9 kg)  06/09/18 141 lb (64 kg)   No data found.  Physical Exam  Constitutional: She is oriented to person, place, and time. She appears well-developed and well-nourished. No distress.  HENT:  Head: Normocephalic and atraumatic.  Right Ear: Hearing normal.  Left Ear: Hearing normal.  Nose: Nose normal.   Eyes: Conjunctivae and lids are normal. Right eye exhibits no discharge. Left eye exhibits no discharge. No scleral icterus.  Cardiovascular: Normal rate, regular rhythm, normal heart sounds and intact distal pulses. Exam reveals no gallop and no friction rub.  No murmur heard. Pulmonary/Chest: Effort normal and breath sounds normal. No stridor. No respiratory distress. She has no wheezes. She has no rales. She exhibits no tenderness.  Musculoskeletal: Normal range of motion.  Neurological: She is alert and oriented to person, place, and time.  Skin: Skin is warm, dry and intact. Capillary refill takes less than 2 seconds. No rash noted. She is not diaphoretic. No erythema. No pallor.  Psychiatric: She has a normal mood and affect. Her speech is normal and behavior is normal. Judgment and thought content normal. Cognition and memory are normal.  Nursing note and vitals reviewed.   Results for orders placed or performed in visit on 08/29/18  Microalbumin, Urine Waived  Result Value Ref Range   Microalb, Ur Waived 30 (H) 0 - 19 mg/L   Creatinine, Urine Waived 200 10 - 300 mg/dL   Microalb/Creat Ratio <30 <30 mg/g      Assessment & Plan:   Problem List Items Addressed This Visit      Cardiovascular and Mediastinum   Hypertension - Primary (Chronic)    Under good control on current regimen. Continue current regimen. Continue to monitor. Call with any concerns. Refills given for 1 year. [place]         Relevant Medications   metoprolol succinate (TOPROL-XL) 25 MG 24 hr tablet   valsartan-hydrochlorothiazide (DIOVAN-HCT) 160-25 MG tablet   Other Relevant Orders   CBC with Differential/Platelet   Comprehensive metabolic panel   Microalbumin, Urine Waived (Completed)     Digestive   GERD (gastroesophageal reflux disease)    Under good control on current regimen. Continue current regimen. Continue to monitor. Call with any  concerns. Refills given for 1 year. Enc[place]        Relevant Medications   pantoprazole (PROTONIX) 20 MG tablet   Other Relevant Orders   CBC with Differential/Platelet   Comprehensive metabolic panel     Other   Hyperlipidemia (Chronic)    Under good control on current regimen. Continue current regimen. Continue to monitor. Call with any concerns. Refills given for 1 year. Encour[place]        Relevant Medications   metoprolol succinate (TOPROL-XL) 25 MG 24 hr tablet   valsartan-hydrochlorothiazide (DIOVAN-HCT) 160-25 MG tablet   Other Relevant Orders   CBC with Differential/Platelet   Comprehensive metabolic panel   Lipid Panel w/o Chol/HDL Ratio   Depression    Not doing great, but stable. Will continue current regimen. Will continue  to monitor. Call with any concerns. Refills given. 3 months of xanax given- will need to establish with new provider at the [facility] for refills on that medicine.       Relevant Medications   sertraline (ZOLOFT) 50 MG tablet   traZODone (DESYREL) 50 MG tablet   ALPRAZolam (XANAX) 0.25 MG tablet   Other Relevant Orders   CBC with Differential/Platelet   Comprehensive metabolic panel   TSH   Elevated rheumatoid factor    Rechecking levels today. Await results.       Relevant Orders   CBC with Differential/Platelet   Comprehensive metabolic panel   Rheumatoid Factor       Follow up plan: Return if symptoms worsen or fail to improve, [place] new provider.Marland Kitchen

## 2018-08-30 LAB — CBC WITH DIFFERENTIAL/PLATELET
BASOS ABS: 0.1 10*3/uL (ref 0.0–0.2)
BASOS: 1 %
EOS (ABSOLUTE): 0.4 10*3/uL (ref 0.0–0.4)
Eos: 6 %
HEMOGLOBIN: 12.5 g/dL (ref 11.1–15.9)
Hematocrit: 35.7 % (ref 34.0–46.6)
IMMATURE GRANS (ABS): 0 10*3/uL (ref 0.0–0.1)
Immature Granulocytes: 0 %
LYMPHS ABS: 1 10*3/uL (ref 0.7–3.1)
LYMPHS: 15 %
MCH: 29.7 pg (ref 26.6–33.0)
MCHC: 35 g/dL (ref 31.5–35.7)
MCV: 85 fL (ref 79–97)
Monocytes Absolute: 0.6 10*3/uL (ref 0.1–0.9)
Monocytes: 9 %
NEUTROS ABS: 4.6 10*3/uL (ref 1.4–7.0)
Neutrophils: 69 %
PLATELETS: 288 10*3/uL (ref 150–450)
RBC: 4.21 x10E6/uL (ref 3.77–5.28)
RDW: 12.4 % (ref 12.3–15.4)
WBC: 6.6 10*3/uL (ref 3.4–10.8)

## 2018-08-30 LAB — COMPREHENSIVE METABOLIC PANEL
A/G RATIO: 1.8 (ref 1.2–2.2)
ALBUMIN: 4.6 g/dL (ref 3.5–4.8)
ALK PHOS: 67 IU/L (ref 39–117)
ALT: 13 IU/L (ref 0–32)
AST: 19 IU/L (ref 0–40)
BUN / CREAT RATIO: 25 (ref 12–28)
BUN: 21 mg/dL (ref 8–27)
Bilirubin Total: 0.6 mg/dL (ref 0.0–1.2)
CHLORIDE: 93 mmol/L — AB (ref 96–106)
CO2: 26 mmol/L (ref 20–29)
Calcium: 10.1 mg/dL (ref 8.7–10.3)
Creatinine, Ser: 0.85 mg/dL (ref 0.57–1.00)
GFR calc non Af Amer: 68 mL/min/{1.73_m2} (ref >59)
GFR, EST AFRICAN AMERICAN: 79 mL/min/{1.73_m2} (ref >59)
Globulin, Total: 2.5 g/dL (ref 1.5–4.5)
Glucose: 92 mg/dL (ref 65–99)
POTASSIUM: 4.9 mmol/L (ref 3.5–5.2)
SODIUM: 133 mmol/L — AB (ref 134–144)
TOTAL PROTEIN: 7.1 g/dL (ref 6.0–8.5)

## 2018-08-30 LAB — LIPID PANEL W/O CHOL/HDL RATIO
CHOLESTEROL TOTAL: 208 mg/dL — AB (ref 100–199)
HDL: 95 mg/dL (ref >39)
LDL Calculated: 101 mg/dL — ABNORMAL HIGH (ref 0–99)
Triglycerides: 60 mg/dL (ref 0–149)
VLDL Cholesterol Cal: 12 mg/dL (ref 5–40)

## 2018-08-30 LAB — TSH: TSH: 1.04 u[IU]/mL (ref 0.450–4.500)

## 2018-08-30 LAB — RHEUMATOID FACTOR: RHEUMATOID FACTOR: 17.2 [IU]/mL — AB (ref 0.0–13.9)

## 2018-08-31 ENCOUNTER — Encounter: Payer: Self-pay | Admitting: Family Medicine

## 2018-08-31 NOTE — Assessment & Plan Note (Signed)
Under good control on current regimen. Continue current regimen. Continue to monitor. Call with any concerns. Refills given for 1 year. Encouraged her to establish with a new provider at the [place].

## 2018-08-31 NOTE — Assessment & Plan Note (Signed)
Under good control on current regimen. Continue current regimen. Continue to monitor. Call with any concerns. Refills given for 1 year. Encouraged her to establish with a new provider at the [place].

## 2018-08-31 NOTE — Assessment & Plan Note (Signed)
Not doing great, but stable. Will continue current regimen. Will continue to monitor. Call with any concerns. Refills given. 3 months of xanax given- will need to establish with new provider at the [facility] for refills on that medicine.

## 2018-08-31 NOTE — Assessment & Plan Note (Signed)
Rechecking levels today. Await results.  

## 2018-08-31 NOTE — Assessment & Plan Note (Signed)
Under good control on current regimen. Continue current regimen. Continue to monitor. Call with any concerns. Refills given for 1 year. Encouraged her to establish with a new provider at the [place].

## 2018-09-01 ENCOUNTER — Encounter: Payer: Self-pay | Admitting: Family Medicine

## 2018-09-16 ENCOUNTER — Ambulatory Visit: Payer: Self-pay | Admitting: Family Medicine

## 2018-10-16 DIAGNOSIS — M0579 Rheumatoid arthritis with rheumatoid factor of multiple sites without organ or systems involvement: Secondary | ICD-10-CM | POA: Diagnosis not present

## 2018-10-16 DIAGNOSIS — Z1231 Encounter for screening mammogram for malignant neoplasm of breast: Secondary | ICD-10-CM | POA: Diagnosis not present

## 2018-10-16 DIAGNOSIS — M816 Localized osteoporosis [Lequesne]: Secondary | ICD-10-CM | POA: Diagnosis not present

## 2018-10-16 DIAGNOSIS — Z23 Encounter for immunization: Secondary | ICD-10-CM | POA: Diagnosis not present

## 2018-12-15 DIAGNOSIS — J029 Acute pharyngitis, unspecified: Secondary | ICD-10-CM | POA: Diagnosis not present

## 2019-04-10 ENCOUNTER — Ambulatory Visit: Payer: Self-pay

## 2019-05-11 ENCOUNTER — Ambulatory Visit: Payer: Self-pay | Admitting: Family Medicine
